# Patient Record
Sex: Female | Born: 1980 | Race: White | Hispanic: No | Smoking: Never smoker
Health system: Southern US, Community
[De-identification: ages and names within clinical notes are randomized; demographics above are authoritative.]

## PROBLEM LIST (undated history)

## (undated) DIAGNOSIS — F329 Major depressive disorder, single episode, unspecified: Secondary | ICD-10-CM

## (undated) DIAGNOSIS — F909 Attention-deficit hyperactivity disorder, unspecified type: Secondary | ICD-10-CM

## (undated) DIAGNOSIS — G43909 Migraine, unspecified, not intractable, without status migrainosus: Secondary | ICD-10-CM

## (undated) DIAGNOSIS — F419 Anxiety disorder, unspecified: Secondary | ICD-10-CM

## (undated) DIAGNOSIS — F32A Depression, unspecified: Secondary | ICD-10-CM

## (undated) DIAGNOSIS — Z8041 Family history of malignant neoplasm of ovary: Secondary | ICD-10-CM

## (undated) HISTORY — DX: Depression, unspecified: F32.A

## (undated) HISTORY — PX: ABDOMINAL SURGERY: SHX537

## (undated) HISTORY — PX: ESOPHAGECTOMY: SUR457

## (undated) HISTORY — DX: Migraine, unspecified, not intractable, without status migrainosus: G43.909

## (undated) HISTORY — DX: Family history of malignant neoplasm of ovary: Z80.41

## (undated) HISTORY — PX: CHOLECYSTECTOMY: SHX55

## (undated) HISTORY — DX: Major depressive disorder, single episode, unspecified: F32.9

## (undated) HISTORY — DX: Attention-deficit hyperactivity disorder, unspecified type: F90.9

## (undated) HISTORY — DX: Anxiety disorder, unspecified: F41.9

---

## 2006-08-16 ENCOUNTER — Emergency Department (HOSPITAL_COMMUNITY): Admission: EM | Admit: 2006-08-16 | Discharge: 2006-08-16 | Payer: Self-pay | Admitting: Family Medicine

## 2006-09-16 ENCOUNTER — Emergency Department (HOSPITAL_COMMUNITY): Admission: EM | Admit: 2006-09-16 | Discharge: 2006-09-16 | Payer: Self-pay | Admitting: Emergency Medicine

## 2006-09-21 ENCOUNTER — Inpatient Hospital Stay (HOSPITAL_COMMUNITY): Admission: AD | Admit: 2006-09-21 | Discharge: 2006-09-21 | Payer: Self-pay | Admitting: Obstetrics and Gynecology

## 2007-05-08 ENCOUNTER — Encounter (INDEPENDENT_AMBULATORY_CARE_PROVIDER_SITE_OTHER): Payer: Self-pay | Admitting: Obstetrics and Gynecology

## 2007-05-08 ENCOUNTER — Inpatient Hospital Stay (HOSPITAL_COMMUNITY): Admission: AD | Admit: 2007-05-08 | Discharge: 2007-05-10 | Payer: Self-pay | Admitting: Obstetrics and Gynecology

## 2007-08-04 ENCOUNTER — Emergency Department (HOSPITAL_COMMUNITY): Admission: EM | Admit: 2007-08-04 | Discharge: 2007-08-04 | Payer: Self-pay | Admitting: Emergency Medicine

## 2007-11-01 ENCOUNTER — Emergency Department (HOSPITAL_COMMUNITY): Admission: EM | Admit: 2007-11-01 | Discharge: 2007-11-01 | Payer: Self-pay | Admitting: Emergency Medicine

## 2008-04-13 ENCOUNTER — Emergency Department (HOSPITAL_COMMUNITY): Admission: EM | Admit: 2008-04-13 | Discharge: 2008-04-13 | Payer: Self-pay | Admitting: Family Medicine

## 2008-05-21 ENCOUNTER — Emergency Department (HOSPITAL_COMMUNITY): Admission: EM | Admit: 2008-05-21 | Discharge: 2008-05-21 | Payer: Self-pay | Admitting: Family Medicine

## 2008-11-04 ENCOUNTER — Emergency Department (HOSPITAL_COMMUNITY): Admission: EM | Admit: 2008-11-04 | Discharge: 2008-11-04 | Payer: Self-pay | Admitting: Family Medicine

## 2008-12-10 ENCOUNTER — Emergency Department (HOSPITAL_COMMUNITY): Admission: EM | Admit: 2008-12-10 | Discharge: 2008-12-10 | Payer: Self-pay | Admitting: Emergency Medicine

## 2009-01-07 ENCOUNTER — Emergency Department (HOSPITAL_COMMUNITY): Admission: EM | Admit: 2009-01-07 | Discharge: 2009-01-08 | Payer: Self-pay | Admitting: Emergency Medicine

## 2009-04-25 ENCOUNTER — Emergency Department (HOSPITAL_COMMUNITY): Admission: EM | Admit: 2009-04-25 | Discharge: 2009-04-25 | Payer: Self-pay | Admitting: Family Medicine

## 2009-06-22 ENCOUNTER — Inpatient Hospital Stay (HOSPITAL_COMMUNITY): Admission: EM | Admit: 2009-06-22 | Discharge: 2009-06-28 | Payer: Self-pay | Admitting: Emergency Medicine

## 2009-07-13 ENCOUNTER — Ambulatory Visit: Payer: Self-pay | Admitting: Internal Medicine

## 2009-07-13 DIAGNOSIS — K220 Achalasia of cardia: Secondary | ICD-10-CM

## 2009-07-13 DIAGNOSIS — E611 Iron deficiency: Secondary | ICD-10-CM | POA: Insufficient documentation

## 2009-07-13 DIAGNOSIS — F319 Bipolar disorder, unspecified: Secondary | ICD-10-CM

## 2009-07-13 DIAGNOSIS — B3731 Acute candidiasis of vulva and vagina: Secondary | ICD-10-CM | POA: Insufficient documentation

## 2009-07-13 DIAGNOSIS — D509 Iron deficiency anemia, unspecified: Secondary | ICD-10-CM

## 2009-07-13 DIAGNOSIS — B373 Candidiasis of vulva and vagina: Secondary | ICD-10-CM

## 2009-07-13 LAB — CONVERTED CEMR LAB
Bilirubin Urine: NEGATIVE
Glucose, Urine, Semiquant: NEGATIVE
Nitrite: POSITIVE
Protein, U semiquant: 100
Specific Gravity, Urine: >=1.03
Urobilinogen, UA: 1
WBC Urine, dipstick: NEGATIVE
pH: 5

## 2009-09-09 ENCOUNTER — Emergency Department (HOSPITAL_COMMUNITY): Admission: EM | Admit: 2009-09-09 | Discharge: 2009-09-09 | Payer: Self-pay | Admitting: Family Medicine

## 2009-09-21 ENCOUNTER — Telehealth (INDEPENDENT_AMBULATORY_CARE_PROVIDER_SITE_OTHER): Payer: Self-pay | Admitting: Internal Medicine

## 2009-09-21 ENCOUNTER — Telehealth (INDEPENDENT_AMBULATORY_CARE_PROVIDER_SITE_OTHER): Payer: Self-pay | Admitting: *Deleted

## 2009-09-27 ENCOUNTER — Ambulatory Visit: Payer: Self-pay | Admitting: Internal Medicine

## 2009-09-27 DIAGNOSIS — R5381 Other malaise: Secondary | ICD-10-CM | POA: Insufficient documentation

## 2009-09-27 DIAGNOSIS — R5383 Other fatigue: Secondary | ICD-10-CM

## 2009-09-27 DIAGNOSIS — J9801 Acute bronchospasm: Secondary | ICD-10-CM | POA: Insufficient documentation

## 2009-10-13 DIAGNOSIS — D72819 Decreased white blood cell count, unspecified: Secondary | ICD-10-CM | POA: Insufficient documentation

## 2009-10-13 LAB — CONVERTED CEMR LAB
ALT: 9 units/L (ref 0–35)
AST: 15 units/L (ref 0–37)
Albumin: 4.2 g/dL (ref 3.5–5.2)
Alkaline Phosphatase: 43 units/L (ref 39–117)
BUN: 10 mg/dL (ref 6–23)
Basophils Absolute: 0 10*3/uL (ref 0.0–0.1)
Basophils Relative: 1 % (ref 0–1)
CO2: 25 meq/L (ref 19–32)
Calcium: 9.3 mg/dL (ref 8.4–10.5)
Chloride: 105 meq/L (ref 96–112)
Creatinine, Ser: 0.57 mg/dL (ref 0.40–1.20)
Eosinophils Absolute: 0 10*3/uL (ref 0.0–0.7)
Eosinophils Relative: 1 % (ref 0–5)
Glucose, Bld: 85 mg/dL (ref 70–99)
HCT: 36.2 % (ref 36.0–46.0)
Hemoglobin: 11.4 g/dL — ABNORMAL LOW (ref 12.0–15.0)
Lymphocytes Relative: 33 % (ref 12–46)
Lymphs Abs: 1.3 10*3/uL (ref 0.7–4.0)
MCHC: 31.5 g/dL (ref 30.0–36.0)
MCV: 99.2 fL (ref 78.0–100.0)
Monocytes Absolute: 0.2 10*3/uL (ref 0.1–1.0)
Monocytes Relative: 5 % (ref 3–12)
Neutro Abs: 2.3 10*3/uL (ref 1.7–7.7)
Neutrophils Relative %: 61 % (ref 43–77)
Platelets: 348 10*3/uL (ref 150–400)
Potassium: 5.3 meq/L (ref 3.5–5.3)
RBC: 3.65 M/uL — ABNORMAL LOW (ref 3.87–5.11)
RDW: 13.5 % (ref 11.5–15.5)
Sodium: 141 meq/L (ref 135–145)
TSH: 0.875 microintl units/mL (ref 0.350–4.500)
Total Bilirubin: 0.3 mg/dL (ref 0.3–1.2)
Total Protein: 7 g/dL (ref 6.0–8.3)
WBC: 3.8 10*3/uL — ABNORMAL LOW (ref 4.0–10.5)

## 2009-12-25 ENCOUNTER — Telehealth (INDEPENDENT_AMBULATORY_CARE_PROVIDER_SITE_OTHER): Payer: Self-pay | Admitting: Internal Medicine

## 2009-12-28 ENCOUNTER — Ambulatory Visit: Payer: Self-pay | Admitting: Internal Medicine

## 2010-01-02 ENCOUNTER — Telehealth (INDEPENDENT_AMBULATORY_CARE_PROVIDER_SITE_OTHER): Payer: Self-pay | Admitting: Internal Medicine

## 2010-01-03 ENCOUNTER — Ambulatory Visit: Payer: Self-pay | Admitting: Internal Medicine

## 2010-01-03 LAB — CONVERTED CEMR LAB
Basophils Absolute: 0.1 10*3/uL (ref 0.0–0.1)
Basophils Relative: 1 % (ref 0–1)
Eosinophils Absolute: 0 10*3/uL (ref 0.0–0.7)
Eosinophils Relative: 1 % (ref 0–5)
HCT: 31.2 % — ABNORMAL LOW (ref 36.0–46.0)
Hemoglobin: 10.5 g/dL — ABNORMAL LOW (ref 12.0–15.0)
INR: 1.11 (ref <1.50)
Lymphocytes Relative: 36 % (ref 12–46)
Lymphs Abs: 1.8 10*3/uL (ref 0.7–4.0)
MCHC: 33.7 g/dL (ref 30.0–36.0)
MCV: 94 fL (ref 78.0–100.0)
Monocytes Absolute: 0.4 10*3/uL (ref 0.1–1.0)
Monocytes Relative: 7 % (ref 3–12)
Neutro Abs: 2.8 10*3/uL (ref 1.7–7.7)
Neutrophils Relative %: 55 % (ref 43–77)
Platelets: 271 10*3/uL (ref 150–400)
Prothrombin Time: 14.2 s (ref 11.6–15.2)
RBC: 3.32 M/uL — ABNORMAL LOW (ref 3.87–5.11)
RDW: 13.4 % (ref 11.5–15.5)
WBC: 5.1 10*3/uL (ref 4.0–10.5)
aPTT: 31 s

## 2010-01-22 ENCOUNTER — Telehealth (INDEPENDENT_AMBULATORY_CARE_PROVIDER_SITE_OTHER): Payer: Self-pay | Admitting: Internal Medicine

## 2010-01-22 ENCOUNTER — Ambulatory Visit: Payer: Self-pay | Admitting: Internal Medicine

## 2010-02-04 ENCOUNTER — Telehealth (INDEPENDENT_AMBULATORY_CARE_PROVIDER_SITE_OTHER): Payer: Self-pay | Admitting: Internal Medicine

## 2010-02-10 ENCOUNTER — Inpatient Hospital Stay (HOSPITAL_COMMUNITY): Admission: EM | Admit: 2010-02-10 | Discharge: 2010-02-13 | Payer: Self-pay | Admitting: Emergency Medicine

## 2010-02-14 ENCOUNTER — Telehealth (INDEPENDENT_AMBULATORY_CARE_PROVIDER_SITE_OTHER): Payer: Self-pay | Admitting: Internal Medicine

## 2010-04-04 ENCOUNTER — Ambulatory Visit: Payer: Self-pay | Admitting: Internal Medicine

## 2010-04-04 DIAGNOSIS — E538 Deficiency of other specified B group vitamins: Secondary | ICD-10-CM | POA: Insufficient documentation

## 2010-04-04 DIAGNOSIS — M25569 Pain in unspecified knee: Secondary | ICD-10-CM

## 2010-04-04 LAB — CONVERTED CEMR LAB
Basophils Absolute: 0 10*3/uL (ref 0.0–0.1)
Basophils Relative: 0 % (ref 0–1)
Eosinophils Absolute: 0 10*3/uL (ref 0.0–0.7)
Eosinophils Relative: 0 % (ref 0–5)
HCT: 35.7 % — ABNORMAL LOW (ref 36.0–46.0)
Hemoglobin: 11.7 g/dL — ABNORMAL LOW (ref 12.0–15.0)
Lymphocytes Relative: 11 % — ABNORMAL LOW (ref 12–46)
Lymphs Abs: 0.9 10*3/uL (ref 0.7–4.0)
MCHC: 32.8 g/dL (ref 30.0–36.0)
MCV: 100.3 fL — ABNORMAL HIGH (ref 78.0–100.0)
Monocytes Absolute: 0.4 10*3/uL (ref 0.1–1.0)
Monocytes Relative: 5 % (ref 3–12)
Neutro Abs: 6.9 10*3/uL (ref 1.7–7.7)
Neutrophils Relative %: 84 % — ABNORMAL HIGH (ref 43–77)
Platelets: 249 10*3/uL (ref 150–400)
RBC: 3.56 M/uL — ABNORMAL LOW (ref 3.87–5.11)
RDW: 13.8 % (ref 11.5–15.5)
Vitamin B-12: 501 pg/mL (ref 211–911)
WBC: 8.2 10*3/uL (ref 4.0–10.5)

## 2010-04-12 ENCOUNTER — Telehealth (INDEPENDENT_AMBULATORY_CARE_PROVIDER_SITE_OTHER): Payer: Self-pay | Admitting: Internal Medicine

## 2010-05-07 ENCOUNTER — Encounter (INDEPENDENT_AMBULATORY_CARE_PROVIDER_SITE_OTHER): Payer: Self-pay | Admitting: Internal Medicine

## 2010-05-23 ENCOUNTER — Encounter (INDEPENDENT_AMBULATORY_CARE_PROVIDER_SITE_OTHER): Payer: Self-pay | Admitting: Internal Medicine

## 2010-05-30 ENCOUNTER — Encounter (INDEPENDENT_AMBULATORY_CARE_PROVIDER_SITE_OTHER): Payer: Self-pay | Admitting: Internal Medicine

## 2010-06-18 ENCOUNTER — Inpatient Hospital Stay (HOSPITAL_COMMUNITY): Admission: EM | Admit: 2010-06-18 | Discharge: 2010-06-25 | Payer: Self-pay | Admitting: Emergency Medicine

## 2010-07-31 ENCOUNTER — Inpatient Hospital Stay (HOSPITAL_COMMUNITY)
Admission: RE | Admit: 2010-07-31 | Discharge: 2010-08-09 | Payer: Self-pay | Source: Home / Self Care | Attending: Surgery | Admitting: Surgery

## 2010-08-06 ENCOUNTER — Ambulatory Visit: Payer: Self-pay | Admitting: Internal Medicine

## 2010-08-08 ENCOUNTER — Telehealth (INDEPENDENT_AMBULATORY_CARE_PROVIDER_SITE_OTHER): Payer: Self-pay | Admitting: *Deleted

## 2010-08-13 ENCOUNTER — Inpatient Hospital Stay (HOSPITAL_COMMUNITY): Admission: EM | Admit: 2010-08-13 | Discharge: 2010-08-27 | Payer: Self-pay | Attending: Surgery | Admitting: Surgery

## 2010-08-13 ENCOUNTER — Encounter
Admission: RE | Admit: 2010-08-13 | Discharge: 2010-08-13 | Payer: Self-pay | Source: Home / Self Care | Attending: Surgery | Admitting: Surgery

## 2010-08-26 LAB — BASIC METABOLIC PANEL
BUN: 7 mg/dL (ref 6–23)
CO2: 29 mEq/L (ref 19–32)
Calcium: 9.1 mg/dL (ref 8.4–10.5)
Chloride: 105 mEq/L (ref 96–112)
Creatinine, Ser: 0.72 mg/dL (ref 0.4–1.2)
GFR calc Af Amer: >60 mL/min (ref >60)
GFR calc non Af Amer: >60 mL/min (ref >60)
Glucose, Bld: 80 mg/dL (ref 70–99)
Potassium: 4.5 mEq/L (ref 3.5–5.1)
Sodium: 143 mEq/L (ref 135–145)

## 2010-08-26 LAB — DIFFERENTIAL
Basophils Absolute: 0 10*3/uL (ref 0.0–0.1)
Basophils Absolute: 0 10*3/uL (ref 0.0–0.1)
Basophils Relative: 0 % (ref 0–1)
Basophils Relative: 0 % (ref 0–1)
Eosinophils Absolute: 0 10*3/uL (ref 0.0–0.7)
Eosinophils Absolute: 0 10*3/uL (ref 0.0–0.7)
Eosinophils Relative: 1 % (ref 0–5)
Eosinophils Relative: 0 % (ref 0–5)
Lymphocytes Relative: 16 % (ref 12–46)
Lymphocytes Relative: 9 % — ABNORMAL LOW (ref 12–46)
Lymphs Abs: 1.1 10*3/uL (ref 0.7–4.0)
Lymphs Abs: 0.7 10*3/uL (ref 0.7–4.0)
Monocytes Absolute: 0.3 10*3/uL (ref 0.1–1.0)
Monocytes Absolute: 0.3 10*3/uL (ref 0.1–1.0)
Monocytes Relative: 4 % (ref 3–12)
Monocytes Relative: 4 % (ref 3–12)
Neutro Abs: 5.6 10*3/uL (ref 1.7–7.7)
Neutro Abs: 6.3 10*3/uL (ref 1.7–7.7)
Neutrophils Relative %: 80 % — ABNORMAL HIGH (ref 43–77)
Neutrophils Relative %: 87 % — ABNORMAL HIGH (ref 43–77)

## 2010-08-26 LAB — CBC
HCT: 30.3 % — ABNORMAL LOW (ref 36.0–46.0)
HCT: 27.5 % — ABNORMAL LOW (ref 36.0–46.0)
Hemoglobin: 10.1 g/dL — ABNORMAL LOW (ref 12.0–15.0)
Hemoglobin: 9.3 g/dL — ABNORMAL LOW (ref 12.0–15.0)
MCH: 33.2 pg (ref 26.0–34.0)
MCH: 33.5 pg (ref 26.0–34.0)
MCHC: 33.3 g/dL (ref 30.0–36.0)
MCHC: 33.8 g/dL (ref 30.0–36.0)
MCV: 99.7 fL (ref 78.0–100.0)
MCV: 98.9 fL (ref 78.0–100.0)
Platelets: 202 10*3/uL (ref 150–400)
Platelets: 183 10*3/uL (ref 150–400)
RBC: 3.04 MIL/uL — ABNORMAL LOW (ref 3.87–5.11)
RBC: 2.78 MIL/uL — ABNORMAL LOW (ref 3.87–5.11)
RDW: 12.9 % (ref 11.5–15.5)
RDW: 12.5 % (ref 11.5–15.5)
WBC: 7 10*3/uL (ref 4.0–10.5)
WBC: 7.3 10*3/uL (ref 4.0–10.5)

## 2010-08-26 LAB — COMPREHENSIVE METABOLIC PANEL
ALT: 23 U/L (ref 0–35)
AST: 22 U/L (ref 0–37)
Albumin: 2.9 g/dL — ABNORMAL LOW (ref 3.5–5.2)
Alkaline Phosphatase: 68 U/L (ref 39–117)
BUN: 6 mg/dL (ref 6–23)
CO2: 26 mEq/L (ref 19–32)
Calcium: 8.6 mg/dL (ref 8.4–10.5)
Chloride: 105 mEq/L (ref 96–112)
Creatinine, Ser: 0.59 mg/dL (ref 0.4–1.2)
GFR calc Af Amer: >60 mL/min (ref >60)
GFR calc non Af Amer: >60 mL/min (ref >60)
Glucose, Bld: 121 mg/dL — ABNORMAL HIGH (ref 70–99)
Potassium: 3.6 mEq/L (ref 3.5–5.1)
Sodium: 137 mEq/L (ref 135–145)
Total Bilirubin: 0.8 mg/dL (ref 0.3–1.2)
Total Protein: 5.9 g/dL — ABNORMAL LOW (ref 6.0–8.3)

## 2010-08-26 LAB — VANCOMYCIN, TROUGH: Vancomycin Tr: 15.3 ug/mL (ref 10.0–20.0)

## 2010-08-28 ENCOUNTER — Encounter (INDEPENDENT_AMBULATORY_CARE_PROVIDER_SITE_OTHER): Payer: Self-pay | Admitting: Internal Medicine

## 2010-08-28 LAB — CREATININE, SERUM
Creatinine, Ser: 0.46 mg/dL (ref 0.4–1.2)
GFR calc Af Amer: >60 mL/min (ref >60)
GFR calc non Af Amer: >60 mL/min (ref >60)

## 2010-09-08 LAB — CONVERTED CEMR LAB
Ferritin: 18 ng/mL (ref 10–291)
Iron: 110 ug/dL (ref 42–145)
RBC Folate: 683 ng/mL — ABNORMAL HIGH (ref 180–600)
Saturation Ratios: 29 % (ref 20–55)
TIBC: 375 ug/dL (ref 250–470)
UIBC: 265 ug/dL
Vitamin B-12: 215 pg/mL (ref 211–911)

## 2010-09-11 ENCOUNTER — Encounter: Payer: Self-pay | Admitting: Surgery

## 2010-09-12 NOTE — Progress Notes (Signed)
  Phone Note Call from Patient Call back at [facility] Phone [phone]   Summary of Call: The pt wants the provider office call in the morning after pill so medicaid can pay; otherwise, will cost her 35 to 70 dollars.  ([facility]  [phone]) Delrae Alfred MD Initial call taken by: Manon Hilding,  February 04, 2010 2:51 PM  Follow-up for Phone Call        Left message on answering machine for pt to return call at [phone]. Will forward to Dr. Delrae Alfred to see if it can be authorized.  Follow-up by: Vesta Mixer CMA,  February 05, 2010 3:11 PM

## 2010-09-12 NOTE — Progress Notes (Signed)
Summary: Lab Results/ Medical Refills  Phone Note Call from Patient Call back at Home Phone [phone] Call back at [phone]   Summary of Call: Pt needs refills in her ritalin medication also she needs her lab results.  ([facility]) Delrae Alfred MD Initial call taken by: Manon Hilding,  Jan 02, 2010 9:49 AM  Follow-up for Phone Call        Fwd to Dr. Delrae Alfred for lab results and rx refill. Follow-up by: Vesta Mixer CMA,  Jan 02, 2010 4:07 PM  Additional Follow-up for Phone Call Additional follow up Details #1::        She has a bit more anemia than previously.  Her white count is now normal. Her clotting studies were normal--nothing to suggest she has a bleeding problem.  See append to lab--need her to come back for RBC folate and send off for anemia labs ordered. I have not been able to speak with Aquilla Solian regarding her case as Marchelle Folks was not here on Tuesday.  Ritalin is on hold for now. Additional Follow-up by: Julieanne Manson MD,  Jan 02, 2010 8:36 PM    Additional Follow-up for Phone Call Additional follow up Details #2::    pt aware Follow-up by: Armenia Shannon,  Jan 04, 2010 3:06 PM

## 2010-09-12 NOTE — Letter (Signed)
Summary: PT INFORMATION SHEET  PT INFORMATION SHEET   Imported By: Arta Bruce 09/05/2009 10:19:35  _____________________________________________________________________  External Attachment:    Type:   Image     Comment:   External Document

## 2010-09-12 NOTE — Progress Notes (Signed)
Summary: Needs GI Referral/ Refills  Phone Note Call from Patient Call back at [phone]   Summary of Call: Since "Sunday, the pt was stayed in the hospital and she was discharged yesterday from achalasia filled up and she is requesting for a referral to go to the GI physician.  (Dr. Ganem [phone])  MD Initial call taken by: Graciela Kellar,  February 14, 2010 10:17 AM  Follow-up for Phone Call        MS HOLLOWAY SAYS THAT SHE WILL BE NEEDING REFILLS ON HER LEXAPRO, AND LAMACTIL. [facility] [address]. Follow-up by: Kimberly Tinnin,  February 15, 2010 12:00 PM  Additional Follow-up for Phone Call Additional follow up Details #1::        pt has an appt on 02/15/2010 with  and i see that she cancelled. she will need to reschedule a hospital f/u appt with dr.  so she can review hospital records prior to referral. will refill meds for 30 days (as this is what Dr.  did last month).  she can get refills at her f/u Additional Follow-up by: Nykedtra Martin FNP,  February 18, 2010 2:38 PM    Additional Follow-up for Phone Call Additional follow up Details #2::    Advised of N. Martin's instructions and refills x1.  Confirmed appt. for 03/01/10 and advised that GI referral is in process.  Theresa Laib RN  February 18, 2010 2:48 PM  Follow-up by: Theresa Laib RN,  February 18, 2010 2:48 PM  Prescriptions: LAMICTAL 100 MG TABS (LAMOTRIGINE) 1 tab by mouth two times a day  #60 x 0   Entered and Authorized by:   Nykedtra Martin FNP   Signed by:   Nykedtra Martin FNP on 02/18/2010   Method used:   [facility]        [facility] [address]  [id number] ([facility])       [address]       [place], [place]  [address]       Ph: [phone] or [phone]       Fax: [phone]   RxID:   [id number] LEXAPRO 20 MG TABS (ESCITALOPRAM OXALATE) 1 tab by mouth daily  #30 x 0   Entered and Authorized by:   Nykedtra Martin FNP   Signed by:   Nykedtra Martin FNP on 02/18/2010  Method used:   [facility]ronically [facility]        [facility] [address]  [id number] ([facility]ail)       [address] [place], [place]  [address]       Ph: [phone] or [phone]       Fax: [phone]   RxID:   [id number]   Appended Document: Needs GI Referral/ Refills

## 2010-09-12 NOTE — Progress Notes (Signed)
  Phone Note Call from Patient Call back at [facility] Phone [phone]   Summary of Call: The pt needs some antibiotics for her upper respiratory infection.  ([facility]) Ryatt Corsino MD Initial call taken by: Manon Hilding,  September 21, 2009 10:11 AM  Follow-up for Phone Call        Pt states she has been sick for 7days. She tried going to Urgent Care and she states thet told her to see her PCP. She has a fever of 102. She has been taking Tylenol Cold and has been coughing up green mucus Pt [facility] [address],  .Marland KitchenGeanie Cooley   September 21, 2009 11:47 AM   Additional Follow-up for Phone Call Additional follow up Details #1::        Ill for almost 8 days now.  Coughing and congested from nose--green mucous.  Feels like she's wheezing--short of breaqth. Did go to Urgent Care at beginning of week --was told had a virus. Feels like she is worsening.  Having fevers even today with temps near 102  She is to go to Urgent Care over the weekend if worsens--really needs to have someone listen to lungs To call at beginning of week if no better and needs to be seen Zpack called in Nyquil at night, Dayquil in daytime Cool mist humidifier Push fluids  Additional Follow-up by: Julieanne Manson MD,  September 21, 2009 6:18 PM    New/Updated Medications: ZITHROMAX 250 MG TABS (AZITHROMYCIN) 2 tabs by mouth today and 1 tab daily for 4 more days Prescriptions: ZITHROMAX 250 MG TABS (AZITHROMYCIN) 2 tabs by mouth today and 1 tab daily for 4 more days  #6 x 0   Entered and Authorized by:   Julieanne Manson MD   Signed by:   Julieanne Manson MD on 09/21/2009   Method used:   [facility]ronically to        [facility]  [phone]* (retail)       [address] [place], [place]  [address]       Ph: [phone] or [phone]       Fax: [phone]   RxID:   [id number]

## 2010-09-12 NOTE — Assessment & Plan Note (Signed)
Summary: fu for wheezing/chest hurts////kt   Vital Signs:  Patient profile:   30 year old female Menstrual status:  regular LMP:     09/24/2009 Weight:      139.1 pounds Temp:     98.2 degrees F oral Pulse rate:   93 / minute Pulse rhythm:   regular Resp:     18 per minute BP sitting:   98 / 65  (left arm) Cuff size:   regular  Vitals Entered By: Geanie Cooley  (September 27, 2009 10:53 AM) CC: Pt states she has been doing alot of wheezing since last friday  09/21/2009. Pt states she has been having a little chest pain with the wheezing. Pt has abeen coughing as well since last friday. Is Patient Diabetic? No Pain Assessment Patient in pain? no       Does patient need assistance? Functional Status Self care Ambulation Normal LMP (date): 09/24/2009     Menstrual Status regular Enter LMP: 09/24/2009   CC:  Pt states she has been doing alot of wheezing since last friday  09/21/2009. Pt states she has been having a little chest pain with the wheezing. Pt has abeen coughing as well since last friday.Marland Kitchen  History of Present Illness: 1.  Wheezing and congested:  See phone note from 09/21/09:  pt. given a Zpack with 8 days of symptoms, but still fairly symptomatic, though does note some improvement.  Still with nasal congestion--now not always green since antibiotics.  No ear pain, no sore throat now.  Was having a fever up to 102 prior to antibiotics.  Highest temp since was 101 2 days after starting antibiotics.  Was 99.3 yesterday.  Cough is nonproductive.  Still feels a bit dyspneic.  Feels she has been ill since November.  Fatigued, week.  Daughter was recently ill for 9 days--diagnosed with Strep throat --2 weeks ago. Eating and drinking okay.  Habits & Providers  Alcohol-Tobacco-Diet     Tobacco Status: never  Allergies (verified): 1)  ! Penicillin  Social History: Smoking Status:  never  Physical Exam  General:  NAD Head:  Normocephalic and atraumatic without obvious  abnormalities. No apparent alopecia or balding. Eyes:  No corneal or conjunctival inflammation noted. EOMI. Perrla. Funduscopic exam benign, without hemorrhages, exudates or papilledema. Vision grossly normal. Ears:  External ear exam shows no significant lesions or deformities.  Otoscopic examination reveals clear canals, tympanic membranes are intact bilaterally without bulging, retraction, inflammation or discharge. Hearing is grossly normal bilaterally. Nose:  mucosal erythema and mucosal edema.   Mouth:  pharynx pink and moist.   Neck:  No deformities, masses, or tenderness noted. Lungs:  Scattered crackles and expiratory wheeze.  Fair air movement Heart:  Normal rate and regular rhythm. S1 and S2 normal without gallop, murmur, click, rub or other extra sounds.   Impression & Recommendations:  Problem # 1:  ACUTE BRONCHOSPASM (ICD-519.11) Wheezing much improved after albuterol neb Feel no longer with infection after Z pack Sample of Advair HFA given Albuterol HFA prn Orders: Nebulizer Tx ([address]) Albuterol Sulfate Sol 1mg  unit dose (U0454)  Problem # 2:  FATIGUE (ICD-780.79) Feels she has been this way since November Orders: T-Comprehensive Metabolic Panel [phone]) T-CBC w/Diff (29562-13086) T-TSH (57846-96295)  Problem # 3:  ACHALASIA (ICD-530.0) Refill meds.  Complete Medication List: 1)  Lexapro 10 Mg Tabs (Escitalopram oxalate) .Marland Kitchen.. 1 by mouth once daily 2)  Lamictal Odt 50 Mg Tbdp (Lamotrigine) .Marland Kitchen.. 1 by mouth once daily 3)  Sucralfate 1  Gm Tabs (Sucralfate) .Marland Kitchen.. 1gram three times a day with meal 4)  Nexium 40 Mg Pack (Esomeprazole magnesium) .Marland Kitchen.. 1 by mouth once daily 5)  Nifedipine 10 Mg Caps (Nifedipine) .Marland Kitchen.. 1 by mouth three times a day ac 6)  Ritalin La 40 Mg Xr24h-cap (Methylphenidate hcl) .Marland Kitchen.. 1 once daily by mouth 7)  Fluconazole 150 Mg Tabs (Fluconazole) .Marland Kitchen.. 1 tab by mouth daily for 2 days 8)  Zithromax 250 Mg Tabs (Azithromycin) .... 2 tabs by mouth  today and 1 tab daily for 4 more days 9)  Ventolin Hfa 108 (90 Base) Mcg/act Aers (Albuterol sulfate) .... 2 puffs every 4 hours as needed for bronchospasm/wheezing. 10)  Advair Hfa 115-21 Mcg/act Aero (Fluticasone-salmeterol) .... 2 inhalations two times a day  Patient Instructions: 1)  Follow up with Dr. Delrae Alfred in 1 week--wheezing Prescriptions: ADVAIR HFA 115-21 MCG/ACT AERO (FLUTICASONE-SALMETEROL) 2 inhalations two times a day  #1 x 0   Entered and Authorized by:   Julieanne Manson MD   Signed by:   Julieanne Manson MD on 09/27/2009   Method used:   Samples Given   RxID:   [id number] VENTOLIN HFA 108 (90 BASE) MCG/ACT AERS (ALBUTEROL SULFATE) 2 puffs every 4 hours as needed for bronchospasm/wheezing.  #1 x 0   Entered and Authorized by:   Julieanne Manson MD   Signed by:   Julieanne Manson MD on 09/27/2009   Method used:   [facility]ronically to        [facility]  [phone]* (retail)       [address] [place], [place]  [address]       Ph: [phone] or [phone]       Fax: [phone]   RxID:   [id number] NIFEDIPINE 10 MG CAPS (NIFEDIPINE) 1 by mouth three times a day ac  #90 x 6   Entered and Authorized by:   Julieanne Manson MD   Signed by:   Julieanne Manson MD on 09/27/2009   Method used:   Electronically to        [facility]  [phone]* (retail)       [address] [place], [place]  [address]       Ph: [phone] or [phone]       Fax: [phone]   RxID:   [id number] NEXIUM 40 MG PACK (ESOMEPRAZOLE MAGNESIUM) 1 by mouth once daily  #30 x 6   Entered and Authorized by:   Julieanne Manson MD   Signed by:   Julieanne Manson MD on 09/27/2009   Method used:   Electronically to        [facility]  [phone]* (retail)       [address] [place], [place]  [address]       Ph: [phone] or [phone]       Fax: [phone]   RxID:   [id number] NIFEDIPINE 10 MG CAPS (NIFEDIPINE) 1 by mouth  three times a day ac  #90 x 6   Entered and Authorized by:   Julieanne Manson MD   Signed by:   Julieanne Manson MD on 09/27/2009   Method used:   Faxed to ...       [facility] (retail)       [address] [place], [place]  [address]       Ph:  [phone]       Fax: [phone]   RxID:   [id number] NEXIUM 40 MG PACK (ESOMEPRAZOLE MAGNESIUM) 1 by mouth once daily  #30 x 6   Entered and Authorized by:   Julieanne Manson MD   Signed by:   Julieanne Manson MD on 09/27/2009   Method used:   Faxed to ...       [facility] (retail)       [address] [place], [place]  [address]       Ph: [phone]       Fax: [phone]   RxID:   [id number]    Medication Administration  Medication # 1:    Medication: Albuterol Sulfate Sol 1mg  unit dose    Diagnosis: ACUTE BRONCHOSPASM (ICD-519.11)    Dose: 2.5 mg    Route: po    Exp Date: 03/2011    Given by: Geanie Cooley (September 27, 2009 1:18 PM)  Orders Added: 1)  Nebulizer Tx [94640] 2)  T-Comprehensive Metabolic Panel [80053-22900] 3)  T-CBC w/Diff [84166-06301] 4)  T-TSH [60109-32355] 5)  Est. Patient Level III [[address]] 6)  Albuterol Sulfate Sol 1mg  unit dose [U5427]

## 2010-09-12 NOTE — Assessment & Plan Note (Signed)
Summary: medication changes//gk   Vital Signs:  Patient profile:   30 year old female Menstrual status:  regular Height:      63 inches Weight:      146 pounds BMI:     25.96 Temp:     97.6 degrees F oral Pulse rate:   71 / minute Pulse rhythm:   regular Resp:     18 per minute BP sitting:   96 / 65  (left arm) Cuff size:   regular  Vitals Entered By: Armenia Shannon (Dec 28, 2009 3:49 PM) CC: pt is here to do a med change...  lexapro, lamitcal and ritalin... Is Patient Diabetic? No Pain Assessment Patient in pain? no       Does patient need assistance? Functional Status Self care Ambulation Normal   CC:  pt is here to do a med change...  lexapro and lamitcal and ritalin....  History of Present Illness: 1.  See phone note.  Called and spoke with ?Verlon Au, pt's contact at [facility]:  apparent blood work did not show a level of Ritalin, so determined she was not taking and did not need.  Pt. refutes, states they must have mixed up her bloodwork with someone else's. Is planning to start school and needs med.  Refuses to go back to [facility] even to get other psychotropics.  Struggling without them and wants [place] to fell all 3 meds.  2.  Bilateral knee aching all the time--wakes her from sleep.  Has had problems since she was a little girl.  3.  Bruises easily--noted for at least past 8 months.  Mainly on legs and sometimes on arms.  No bleeding disorders she is aware of in family.  No recent nose bleeds.  Gums only rarely bleed with significant brushing.  Current Medications (verified): 1)  Lexapro 10 Mg Tabs (Escitalopram Oxalate) .Marland Kitchen.. 1 By Mouth Once Daily 2)  Lamictal Odt 50 Mg Tbdp (Lamotrigine) .Marland Kitchen.. 1 By Mouth Once Daily 3)  Sucralfate 1 Gm Tabs (Sucralfate) .Marland Kitchen.. 1gram Three Times A Day With Meal 4)  Nexium 40 Mg Pack (Esomeprazole Magnesium) .Marland Kitchen.. 1 By Mouth Once Daily 5)  Nifedipine 10 Mg Caps (Nifedipine) .Marland Kitchen.. 1 By Mouth Three Times A Day Ac 6)  Ritalin La 40 Mg Xr24h-Cap  (Methylphenidate Hcl) .Marland Kitchen.. 1 Once Daily By Mouth 7)  Fluconazole 150 Mg Tabs (Fluconazole) .Marland Kitchen.. 1 Tab By Mouth Daily For 2 Days 8)  Zithromax 250 Mg Tabs (Azithromycin) .... 2 Tabs By Mouth Today and 1 Tab Daily For 4 More Days 9)  Ventolin Hfa 108 (90 Base) Mcg/act Aers (Albuterol Sulfate) .... 2 Puffs Every 4 Hours As Needed For Bronchospasm/wheezing. 10)  Advair Hfa 115-21 Mcg/act Aero (Fluticasone-Salmeterol) .... 2 Inhalations Two Times A Day  Allergies (verified): 1)  ! Penicillin  Physical Exam  General:  NAD, though obviously upset with current situation Lungs:  Normal respiratory effort, chest expands symmetrically. Lungs are clear to auscultation, no crackles or wheezes. Heart:  Normal rate and regular rhythm. S1 and S2 normal without gallop, murmur, click, rub or other extra sounds. Extremities:  No effusion, tenderness, or erythema. No crepitation.  Full ROM without discomfort.  No laxity of ligaments. Skin:  One large bruise on right leg--Yellowish hue on margins suggestive of resolution.  No other bruising noted.   Impression & Recommendations:  Problem # 1:  BIPOLAR DISORDER UNSPECIFIED (ICD-296.80) Assessment Unchanged Pt. giving hx today of ADD as well--has given hx of taking Ritalin in past, but we  have not discussed diagnosis as she was already in treatment at [facility] with Dr. Manson Passey.   Discussed I would like to have our liason with [facility], Aquilla Solian look into matter further, but that I would fill her psychotropics save for Ritalin today as appears she was not herself using the medication (again pt. denies this is the case).  Pt. feels she has been poorly treated by [facility]. Pt. in meantime is looking for a different mental health provider.  Needs to find before this Rx is out.  Problem # 2:  EASY BRUISING (ICD-782.7) Doubt bleeding diathesis Orders: T-CBC w/Diff (29562-13086) T-Protime, Auto (57846-96295) T-PTT (28413-24401)  Complete Medication List: 1)   Lexapro 20 Mg Tabs (Escitalopram oxalate) .Marland Kitchen.. 1 tab by mouth daily 2)  Lamictal 100 Mg Tabs (Lamotrigine) .Marland Kitchen.. 1 tab by mouth two times a day 3)  Sucralfate 1 Gm Tabs (Sucralfate) .Marland Kitchen.. 1gram three times a day with meal 4)  Nexium 40 Mg Pack (Esomeprazole magnesium) .Marland Kitchen.. 1 by mouth once daily 5)  Nifedipine 10 Mg Caps (Nifedipine) .Marland Kitchen.. 1 by mouth three times a day ac 6)  Ritalin La 40 Mg Xr24h-cap (Methylphenidate hcl) .Marland Kitchen.. 1 once daily by mouth 7)  Fluconazole 150 Mg Tabs (Fluconazole) .Marland Kitchen.. 1 tab by mouth daily for 2 days 8)  Zithromax 250 Mg Tabs (Azithromycin) .... 2 tabs by mouth today and 1 tab daily for 4 more days 9)  Ventolin Hfa 108 (90 Base) Mcg/act Aers (Albuterol sulfate) .... 2 puffs every 4 hours as needed for bronchospasm/wheezing. 10)  Advair Hfa 115-21 Mcg/act Aero (Fluticasone-salmeterol) .... 2 inhalations two times a day  Patient Instructions: 1)  Call if you do not hear about the Ritalin by end of next week. Prescriptions: LEXAPRO 20 MG TABS (ESCITALOPRAM OXALATE) 1 tab by mouth daily  #30 x 0   Entered and Authorized by:   Julieanne Manson MD   Signed by:   Julieanne Manson MD on 12/28/2009   Method used:   [facility]ronically to        [facility]  [phone]* (retail)       [address] [place], [place]  [address]       Ph: [phone] or [phone]       Fax: [phone]   RxID:   [id number] LAMICTAL 100 MG TABS (LAMOTRIGINE) 1 tab by mouth two times a day  #60 x 0   Entered and Authorized by:   Julieanne Manson MD   Signed by:   Julieanne Manson MD on 12/28/2009   Method used:   Electronically to        [facility]  [phone]* (retail)       [address] [place], [place]  [address]       Ph: [phone] or [phone]       Fax: [phone]   RxID:   [id number]

## 2010-09-12 NOTE — Progress Notes (Signed)
Summary: Office Visit/DEPRESSION SCREENING  Office Visit/DEPRESSION SCREENING   Imported By: Arta Bruce 09/04/2009 14:20:33  _____________________________________________________________________  External Attachment:    Type:   Image     Comment:   External Document

## 2010-09-12 NOTE — Progress Notes (Signed)
Summary: YEAST INFEC./WANTS DIFLUCAN  Phone Note Call from Patient Call back at [facility] Phone [phone]   Summary of Call: [name] CALLED AND SAYS THATSHE HAS A YEAST INFECTION AND WOULD LIKE FOR DR Tristy Udovich TO IN DIFLUCAN TO [facility] [address], AND FOR SOMEONE TO CALL AND LET HER KNOW ITS BEEN DONE. Initial call taken by: Leodis Rains,  April 12, 2010 12:35 PM  Follow-up for Phone Call        fwd to Dr.Ocie Stanzione Follow-up by: Michelle Nasuti,  April 12, 2010 3:04 PM  Additional Follow-up for Phone Call Additional follow up Details #1::        MS HOLLOWAY CALLED FOR THE SECOND TIME. Additional Follow-up by: Leodis Rains,  April 12, 2010 3:30 PM    Additional Follow-up for Phone Call Additional follow up Details #2::    Called pt.--reached her "confidentional voice mail"  and name given--left message to try an over the counter vaginal treatment and to call on Tuesday if no improvement.  Asked her to give symptoms in future. Follow-up by: Julieanne Manson MD,  April 12, 2010 6:03 PM

## 2010-09-12 NOTE — Letter (Signed)
Summary: PSYCHOLOGY REFERRAL/NO SHOW  PSYCHOLOGY REFERRAL/NO SHOW   Imported By: Arta Bruce 08/19/2010 10:08:41  _____________________________________________________________________  External Attachment:    Type:   Image     Comment:   External Document

## 2010-09-12 NOTE — Progress Notes (Signed)
Summary: Discharge  Phone Note Other Incoming   Summary of Call: Please review patient's chart for possible D/C. This patient has reached her 5th no-show. Thanks [facility] Initial call taken by: Hassell Halim CMA,  August 08, 2010 9:42 AM  Follow-up for Phone Call        May discharge Follow-up by: Julieanne Manson MD,  August 08, 2010 5:59 PM  Additional Follow-up for Phone Call Additional follow up Details #1::        Please procede with discharge.Marland KitchenMarland KitchenMarland KitchenHassell Halim CMA  August 27, 2010 3:26 PM     Additional Follow-up for Phone Call Additional follow up Details #2::    COMPLETE/MAILED LETTER SCANNED INTO EMR AND DISCHARGED IN [facility] Follow-up by: Arta Bruce,  August 28, 2010 3:29 PM

## 2010-09-12 NOTE — Letter (Signed)
Summary: FAXED REQUESTED INFO TO [facility]  FAXED REQUESTED INFO TO [facility]   Imported By: Arta Bruce 05/23/2010 12:06:23  _____________________________________________________________________  External Attachment:    Type:   Image     Comment:   External Document

## 2010-09-12 NOTE — Progress Notes (Signed)
Summary: Pt wants to speak with the provider re meds and appt  Phone Note Call from Patient Call back at [facility] Phone [phone] Call back at [phone]   Summary of Call: The pt used to go the [facility] but she states that her psychiatric mess up her blood work and because of that they took away her medication (Ritalin 40 mg) and she is wondered if the provider can give that lamitcal 200 mg and lexapro 20 mg.   Basically wants to share with the provider directly because she states that they treat her unfairly. ([facility])   Pt had been trying to get an appointment with the provider asap. Mattox Schorr MD Initial call taken by: Manon Hilding,  Dec 25, 2009 9:26 AM  Follow-up for Phone Call        Left message on answering machine to call [facility]. Dutch Quint RN  Dec 26, 2009 11:45 AM  Spoke with pt. -- needs Lamictal, Ritalin and Lexapro filled asap.  Appt. made for 01/08/10.  Pt. wanted to come in and wait - advised there's no guarantee of being seen.  Dutch Quint RN  Dec 26, 2009 11:58 AM   Additional Follow-up for Phone Call Additional follow up Details #1::        Was seen 12/28/09--did not refill Ritalin as with blood testing at [facility], pt. did not show a Ritalin level.  Discussed this with pt. at visit--see OV Additional Follow-up by: Julieanne Manson MD,  Dec 31, 2009 8:27 AM

## 2010-09-12 NOTE — Progress Notes (Signed)
Summary: Medication refill  Phone Note Refill Request Message from:  Patient  Refills Requested: Medication #1:  LEXAPRO 20 MG TABS 1 tab by mouth daily   Last Refilled: 12/28/2009  Medication #2:  LAMICTAL 100 MG TABS 1 tab by mouth two times a day   Last Refilled: 12/28/2009 Initial call taken by: Levon Hedger,  January 22, 2010 2:30 PM  Follow-up for Phone Call        [name] was going to find a new psychiatric provider before her Rx was up.   Will fill one more month, but want her to find another provider or return to [facility] for psych Rx after this Follow-up by: Julieanne Manson MD,  January 22, 2010 6:22 PM    Prescriptions: LAMICTAL 100 MG TABS (LAMOTRIGINE) 1 tab by mouth two times a day  #60 x 0   Entered and Authorized by:   Julieanne Manson MD   Signed by:   Julieanne Manson MD on 01/22/2010   Method used:   Electronically to        [facility]  [phone]* (retail)       [address] [place], [place]  [address]       Ph: [phone] or [phone]       Fax: [phone]   RxID:   [id number] LEXAPRO 20 MG TABS (ESCITALOPRAM OXALATE) 1 tab by mouth daily  #30 x 0   Entered and Authorized by:   Julieanne Manson MD   Signed by:   Julieanne Manson MD on 01/22/2010   Method used:   Electronically to        [facility]  [phone]* (retail)       [address] [place], [place]  [address]       Ph: [phone] or [phone]       Fax: [phone]   RxID:   [id number]

## 2010-09-12 NOTE — Letter (Signed)
Summary: REPORT OF MEDICAL EXAM  REPORT OF MEDICAL EXAM   Imported By: Arta Bruce 09/06/2009 12:33:22  _____________________________________________________________________  External Attachment:    Type:   Image     Comment:   External Document

## 2010-09-12 NOTE — Progress Notes (Signed)
  Phone Note Call from Patient Call back at [facility] Phone [phone]

## 2010-09-12 NOTE — Letter (Signed)
Summary: Letter//discharge letter/no shows 08/06/10/03/01/10/01/16/10/3/18/1  Letter//discharge letter/no shows 08/06/10/03/01/10/01/16/10/10/26/09/08/23/09   Imported By: Arta Bruce 08/28/2010 15:20:04  _____________________________________________________________________  External Attachment:    Type:   Image     Comment:   External Document

## 2010-09-12 NOTE — Assessment & Plan Note (Signed)
Summary: renew medications//gk   Vital Signs:  Patient profile:   30 year old female Menstrual status:  regular LMP:     03/04/2010 Height:      63 inches O2 Sat:      99 % on Room air Temp:     98.2 degrees F oral Pulse rate:   79 / minute Pulse rhythm:   regular Resp:     16 per minute BP sitting:   104 / 68  (left arm)  Vitals Entered By: Vesta Mixer CMA (April 04, 2010 8:46 AM)  O2 Flow:  Room air CC: pt presents to clinic for follow up and med refills Is Patient Diabetic? No Pain Assessment Patient in pain? no       Does patient need assistance? Functional Status Self care Ambulation Normal LMP (date): 03/04/2010     Enter LMP: 03/04/2010   CC:  pt presents to clinic for follow up and med refills.  History of Present Illness: 1.  Bipolar Disorder:  Has been unable to establish with someone for psychiatric care.  Doing well on Lamictal and Lexapro.  Using Melatonin on rare occasion for sleep.    2.  ADD:  dx as per pt.  States in past was given Adderall through [facility], which has been questioned.  Does not want to go back there for this as well.   3.  Anemia:  Changed to pills from liquid of iron--the liquid burned her throat too much.  Also taking B12 as she had low normal B12 level.  Feels her energy level is much higher.  Has had esophageal ulcers in past.  No melena or hematochezia.  Periods are regular and quite heavy with clots.  Has been taking B12 and iron now for about 2 months, maybe 3.    4.  Medical coverage:  pt. started work end of July and will be out of Medicaid in October.  Has a 90 day waiting period before insurance with job kicks in.  That should also be in October --pt. concerned she will be without coverage for meds.  5.  Bilateral knee pain--points to lower patellar area.  Hurts more after sitting for a while.  No other joints involved.  No redness or swelling of joints--just an ache.  No history of injury.  6.  Acne lesions--has  tried a lot of otc meds.  Nothing seems to work. Not sure if has used Benzoyl peroxide.  Allergies: 1)  ! Penicillin  Physical Exam  General:  Few old acneiform lesions on face--generally on chin and around hairline Lungs:  Normal respiratory effort, chest expands symmetrically. Lungs are clear to auscultation, no crackles or wheezes. Heart:  Normal rate and regular rhythm. S1 and S2 normal without gallop, murmur, click, rub or other extra sounds. Abdomen:  Mild epigastric tendernesssoft, no hepatomegaly, and no splenomegaly.   Msk:  Full ROM of knees No swelling or redness, no crepitation. No ligamentous laxity  NT along joint line bilaterally Point tenderness that reproduces pain over patellar tendon bilaterally   Impression & Recommendations:  Problem # 1:  KNEE PAIN, BILATERAL (ICD-719.46) Appears to be a mild patellar tendinitis No NSAIDS with achalasia recommended quadriceps stretches and strengthening Encouraged swimming.  Problem # 2:  VITAMIN B12 DEFICIENCY (ICD-266.2)  Orders: T-CBC w/Diff ([address]) T-Vitamin B12 (09811-91478)  Problem # 3:  ANEMIA, IRON DEFICIENCY (ICD-280.9)  Her updated medication list for this problem includes:    Ferrous Sulfate 325 (65 Fe) Mg Tabs (  Ferrous sulfate) .Marland Kitchen... 1 tab by mouth two times a day    Vitamin B-12 1000 Mcg Tabs (Cyanocobalamin) .Marland Kitchen... 1 tab by mouth daily  Orders: T-CBC w/Diff ([address]-60454) T-Vitamin B12 (09811-91478)  Problem # 4:  ACHALASIA (ICD-530.0) Change Nexium to two times a day dosing as per Dr. Donavan Burnet. to call his office and see if can get samples if no coverage for a time.  Problem # 5:  BIPOLAR DISORDER UNSPECIFIED (ICD-296.80)  Controlled To check dates and see if will be without medication coverage in October--if so, can check with [employer] [facility] to see if can get HS pharmacy coverage for that time.  Orders: Psychology Referral (Psychology)  Complete Medication List: 1)  Lexapro 20 Mg Tabs  (Escitalopram oxalate) .Marland Kitchen.. 1 tab by mouth daily 2)  Lamictal 100 Mg Tabs (Lamotrigine) .Marland Kitchen.. 1 tab by mouth two times a day 3)  Sucralfate 1 Gm Tabs (Sucralfate) .Marland Kitchen.. 1gram three times a day with meal 4)  Nexium 40 Mg Pack (Esomeprazole magnesium) .Marland Kitchen.. 1 tab by mouth two times a day 5)  Nifedipine 10 Mg Caps (Nifedipine) .Marland Kitchen.. 1 by mouth three times a day ac 6)  Ritalin La 40 Mg Xr24h-cap (Methylphenidate hcl) .Marland Kitchen.. 1 once daily by mouth 7)  Ferrous Sulfate 325 (65 Fe) Mg Tabs (Ferrous sulfate) .Marland Kitchen.. 1 tab by mouth two times a day 8)  Vitamin B-12 1000 Mcg Tabs (Cyanocobalamin) .Marland Kitchen.. 1 tab by mouth daily 9)  Gi Cocktail  .... To use by mouth as needed dysphagia-dr. ganem  Patient Instructions: 1)  Follow up with Dr. Delrae Alfred in 4 months  2)  Referral to [facility] to find psych coverage for meds/counseling.   3)  Quadriceps stretches two times a day for 10-20 minutes. 4)  Encourage swimming or other quadriceps strengthening as will Prescriptions: VITAMIN B-12 1000 MCG TABS (CYANOCOBALAMIN) 1 tab by mouth daily  #90 x 1   Entered and Authorized by:   Julieanne Manson MD   Signed by:   Julieanne Manson MD on 04/04/2010   Method used:   [facility] to        [facility]  [phone]* (retail)       [address] [place], [place]  [address]       Ph: [phone] or [phone]       Fax: [phone]   RxID:   [id number] NIFEDIPINE 10 MG CAPS (NIFEDIPINE) 1 by mouth three times a day ac  #270 x 1   Entered and Authorized by:   Julieanne Manson MD   Signed by:   Julieanne Manson MD on 04/04/2010   Method used:   Electronically to        [facility]  [phone]* (retail)       [address] [place], [place]  [address]       Ph: [phone] or [phone]       Fax: [phone]   RxID:   [id number] NEXIUM 40 MG PACK (ESOMEPRAZOLE MAGNESIUM) 1 tab by mouth two times a day  #180 x 1   Entered and Authorized by:   Julieanne Manson MD   Signed by:    Julieanne Manson MD on 04/04/2010   Method used:   Electronically to        [facility]  [phone]* (retail)       [address] [place], [place]  [address]  Ph: [phone] or [phone]       Fax: [phone]   RxID:   [id number] LAMICTAL 100 MG TABS (LAMOTRIGINE) 1 tab by mouth two times a day  #180 x 1   Entered and Authorized by:   Julieanne Manson MD   Signed by:   Julieanne Manson MD on 04/04/2010   Method used:   Electronically to        [facility]  [phone]* (retail)       [address] [place], [place]  [address]       Ph: [phone] or [phone]       Fax: [phone]   RxID:   [id number] LEXAPRO 20 MG TABS (ESCITALOPRAM OXALATE) 1 tab by mouth daily  #90 x 1   Entered and Authorized by:   Julieanne Manson MD   Signed by:   Julieanne Manson MD on 04/04/2010   Method used:   Electronically to        [facility]  [phone]* (retail)       [address] [place], [place]  [address]       Ph: [phone] or [phone]       Fax: [phone]   RxID:   [id number]

## 2010-09-12 NOTE — Letter (Signed)
Summary: *HSN Results Follow up  [facility]  [address] [place], [place] [address]   Phone: [phone]  Fax: [phone]      05/07/2010   [facility] [address] [place], [place]  [address]   Dear  Ms. [name] [name],                            ____S.Drinkard,FNP   ____D. Gore,FNP       ____B. McPherson,MD   ____V. Rankins,MD    __X__E. Mulberry,MD    ____N. Daphine Deutscher, FNP  ____D. Reche Dixon, MD    ____K. Philipp Deputy, MD    ____Other     This letter is to inform you that your recent test(s):  _______Pap Smear    ___X____Lab Test     _______X-ray    _______ is within acceptable limits  _______ requires a medication change  _______ requires a follow-up lab visit  _______ requires a follow-up visit with your Nalaya Wojdyla   Comments:  Your anemia is almost resolved and your B12 level is now in a normal range.  I would keep taking both the iron (as you have ongoing losses with your periods) and the B12 and we can recheck your blood counts in the next 4-6 months.       _________________________________________________________ If you have any questions, please contact our office                     Sincerely,  [name] Manson MD [facility]

## 2010-09-24 DIAGNOSIS — K222 Esophageal obstruction: Secondary | ICD-10-CM | POA: Insufficient documentation

## 2010-09-24 NOTE — Op Note (Signed)
[name], [name]             ACCOUNT NO.:  [id number]  MEDICAL RECORD NO.:  [id number]          PATIENT TYPE:  INP  LOCATION:  1343                         FACILITY:  [facility]  PHYSICIAN:  Graylin Shiver, M.D.   DATE OF BIRTH:  1980-11-12  DATE OF PROCEDURE:  08/14/2010 DATE OF DISCHARGE:                              OPERATIVE REPORT   INDICATIONS FOR PROCEDURE:  The patient is a 30 year old female who recently underwent a Heller myotomy for chronic achalasia a couple of weeks ago.  She was subsequently discharged after the surgery.  She was admitted yesterday because of complaints of vomiting and being unable to keep anything down.  A barium swallow was done which showed barium retained in the esophagus without passage into the stomach.  Dr. Daphine Deutscher requested endoscopy to evaluate the distal esophagus and see if there was an obstruction or anything else going on.  Informed consent was obtained after explanation of the risks of bleeding, infection and perforation.  Sedation was done by anesthesia using propofol.  PROCEDURE IN DETAIL:  With the patient in the left lateral decubitus position, the Pentax gastroscope was inserted into the oropharynx and passed into the esophagus.  It was advanced down the esophagus which looked to be somewhat dilated.  There was a little bit of retained barium at the distal esophagus which was suctioned.  After this was suctioned, I could see the area of the esophagogastric junction which was somewhat angulated up to the right side of the visual field.  I angled the scope tip towards the esophagogastric junction and felt that there was some spasm at this point.  After keeping the scope tip at the esophagogastric junction for a few seconds and gently manipulating the tip of the scope, the scope did go into the stomach without any difficulty.  There was some erythema noted at the distal esophagus region at the level of the esophagogastric junction,  but no ulcers or any other significant abnormalities.  After entering the stomach, the scope was retroflexed and I looked back up at the fundus and cardia of the stomach which looked fine without any obvious lesions.  The scope was then straightened and brought back up into the esophagus.  At this point, after going into the stomach, the esophagogastric junction and distal esophagus area was open and there does not appear to be any evidence of an actual obstruction at this time.  I moved the scope back and forth through the esophagogastric junction a few times and it seemed to be nicely open and without obvious spasm as initially detected.  The rest of the esophagus mucosa looked fine.  She tolerated this procedure well without complications.  IMPRESSION: 1. Achalasia. 2. Status post Heller myotomy. 3. Spasm at the esophagogastric junction, which was relieved and then     finally the scope was able to get into the stomach.  There does not     appear to be an obvious stricture at this point at this time.  PLAN:  Observation.  Try clear liquids.  I talked to the patient and her family in the recovery area of endoscopy after  the procedure.  She felt fine.  She was sitting up and we reviewed the findings.  Hopefully, she will do well.          ______________________________ Graylin Shiver, M.D.     SFG/MEDQ  D:  08/14/2010  T:  08/14/2010  Job:  [id number]  cc:   Thornton Park Daphine Deutscher, MD [address], [address] [place] [address]  Electronically Signed by Herbert Moors MD on 09/24/2010 12:48:02 PM

## 2010-09-30 ENCOUNTER — Emergency Department (HOSPITAL_COMMUNITY)
Admission: EM | Admit: 2010-09-30 | Discharge: 2010-09-30 | Disposition: A | Discharge status: Home / Self Care | Payer: Medicaid Other | Attending: Emergency Medicine | Admitting: Emergency Medicine

## 2010-09-30 DIAGNOSIS — R112 Nausea with vomiting, unspecified: Secondary | ICD-10-CM | POA: Insufficient documentation

## 2010-09-30 DIAGNOSIS — F329 Major depressive disorder, single episode, unspecified: Secondary | ICD-10-CM | POA: Insufficient documentation

## 2010-09-30 DIAGNOSIS — R109 Unspecified abdominal pain: Secondary | ICD-10-CM | POA: Insufficient documentation

## 2010-09-30 DIAGNOSIS — F3289 Other specified depressive episodes: Secondary | ICD-10-CM | POA: Insufficient documentation

## 2010-09-30 DIAGNOSIS — K220 Achalasia of cardia: Secondary | ICD-10-CM | POA: Insufficient documentation

## 2010-09-30 DIAGNOSIS — F988 Other specified behavioral and emotional disorders with onset usually occurring in childhood and adolescence: Secondary | ICD-10-CM | POA: Insufficient documentation

## 2010-09-30 LAB — COMPREHENSIVE METABOLIC PANEL
ALT: 23 U/L (ref 0–35)
AST: 32 U/L (ref 0–37)
Alkaline Phosphatase: 63 U/L (ref 39–117)
CO2: 28 mEq/L (ref 19–32)
Chloride: 101 mEq/L (ref 96–112)
GFR calc Af Amer: >60 mL/min (ref >60)
GFR calc non Af Amer: >60 mL/min (ref >60)
Potassium: 4.9 mEq/L (ref 3.5–5.1)
Sodium: 136 mEq/L (ref 135–145)
Total Bilirubin: 0.2 mg/dL — ABNORMAL LOW (ref 0.3–1.2)

## 2010-09-30 LAB — CBC
Hemoglobin: 11.1 g/dL — ABNORMAL LOW (ref 12.0–15.0)
MCH: 32.4 pg (ref 26.0–34.0)
RBC: 3.43 MIL/uL — ABNORMAL LOW (ref 3.87–5.11)
WBC: 4.9 10*3/uL (ref 4.0–10.5)

## 2010-09-30 LAB — URINALYSIS, ROUTINE W REFLEX MICROSCOPIC
Hgb urine dipstick: NEGATIVE
Specific Gravity, Urine: 1.021 (ref 1.005–1.030)
Urobilinogen, UA: 1 mg/dL (ref 0.0–1.0)

## 2010-09-30 LAB — DIFFERENTIAL
Basophils Absolute: 0 10*3/uL (ref 0.0–0.1)
Basophils Relative: 0 % (ref 0–1)
Monocytes Relative: 7 % (ref 3–12)
Neutro Abs: 3.4 10*3/uL (ref 1.7–7.7)
Neutrophils Relative %: 69 % (ref 43–77)

## 2010-10-02 ENCOUNTER — Inpatient Hospital Stay (HOSPITAL_COMMUNITY)
Admission: AD | Admit: 2010-10-02 | Discharge: 2010-10-08 | DRG: 392 | Disposition: A | Discharge status: Home / Self Care | Payer: Medicaid Other | Source: Ambulatory Visit | Attending: Surgery | Admitting: Surgery

## 2010-10-02 ENCOUNTER — Inpatient Hospital Stay (HOSPITAL_COMMUNITY): Payer: Medicaid Other

## 2010-10-02 DIAGNOSIS — F319 Bipolar disorder, unspecified: Secondary | ICD-10-CM | POA: Diagnosis present

## 2010-10-02 DIAGNOSIS — K228 Other specified diseases of esophagus: Principal | ICD-10-CM | POA: Diagnosis present

## 2010-10-02 DIAGNOSIS — K219 Gastro-esophageal reflux disease without esophagitis: Secondary | ICD-10-CM | POA: Diagnosis present

## 2010-10-02 DIAGNOSIS — Z9889 Other specified postprocedural states: Secondary | ICD-10-CM

## 2010-10-02 DIAGNOSIS — K2289 Other specified disease of esophagus: Principal | ICD-10-CM | POA: Diagnosis present

## 2010-10-02 DIAGNOSIS — K5900 Constipation, unspecified: Secondary | ICD-10-CM | POA: Diagnosis present

## 2010-10-02 DIAGNOSIS — R109 Unspecified abdominal pain: Secondary | ICD-10-CM | POA: Diagnosis present

## 2010-10-02 LAB — COMPREHENSIVE METABOLIC PANEL
AST: 37 U/L (ref 0–37)
BUN: 8 mg/dL (ref 6–23)
CO2: 26 mEq/L (ref 19–32)
Calcium: 9.1 mg/dL (ref 8.4–10.5)
Creatinine, Ser: 0.57 mg/dL (ref 0.4–1.2)
GFR calc Af Amer: >60 mL/min (ref >60)
GFR calc non Af Amer: >60 mL/min (ref >60)
Total Bilirubin: 1 mg/dL (ref 0.3–1.2)

## 2010-10-02 LAB — CBC
MCH: 32.3 pg (ref 26.0–34.0)
MCHC: 32.8 g/dL (ref 30.0–36.0)
MCV: 98.4 fL (ref 78.0–100.0)
Platelets: 193 10*3/uL (ref 150–400)

## 2010-10-02 LAB — DIFFERENTIAL
Basophils Absolute: 0 10*3/uL (ref 0.0–0.1)
Basophils Relative: 1 % (ref 0–1)
Eosinophils Absolute: 0 10*3/uL (ref 0.0–0.7)
Lymphs Abs: 0.8 10*3/uL (ref 0.7–4.0)
Neutro Abs: 2.3 10*3/uL (ref 1.7–7.7)

## 2010-10-02 MED ORDER — IOHEXOL 300 MG/ML  SOLN
100.0000 mL | Freq: Once | INTRAMUSCULAR | Status: AC | PRN
  Administered 2010-10-02: 100 mL via INTRAVENOUS

## 2010-10-02 NOTE — H&P (Signed)
  NAMEMORA, [name] [name]             ACCOUNT NO.:  [id number]  MEDICAL RECORD NO.:  [id number]           PATIENT TYPE:  I  LOCATION:  1598                         FACILITY:  [facility]  PHYSICIAN:  Thornton Park. Daphine Deutscher, MD  DATE OF BIRTH:  [birth year]  DATE OF ADMISSION:  10/02/2010 DATE OF DISCHARGE:                             HISTORY & PHYSICAL   CHIEF COMPLAINT:  Upper abdominal pain, status post esophageal dilatation and Botox injection at [facility].  HISTORY:  [name] [name] is a 30 year old white female who underwent a laparoscopic Heller myotomy done at [facility] on July 31, 2010. 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  Despite what appeared to be a good myotomy, she is persistently having some esophageal spasm and really no motility.  She underwent an endoscopy by Dr. Evette Cristal postop and it appeared the scope easily went into her stomach but I think it seems to be more of a motility issue.  She was referred to [facility] for evaluation.  Recently, she was admitted over there and was treated by undergoing barium swallow and with esophageal dilatation and Botox injection.  She said however they did not work up her upper abdominal pain.  I was concerned when she called a couple of days ago that may be she had a perforation and I asked her to go back to [facility] but she declined because she said they did not listen to her.  She is admitted at this time for hydration and further evaluation.  PAST MEDICAL HISTORY:  Significant for: 1. Bipolar disease. 2. Depression. 3. Achalasia.  FAMILY HISTORY:  Mother has congestive heart failure.  Her father has hypertension.  SOCIAL HISTORY:  The patient has 2 children.  She does not smoke or drink.  MEDICATIONS:  Include panoply of foregut medications some of which include: 1. Sucralfate. 2. Nexium. 3. Nifedipine for spasm. 4. She also takes lamotrigine 300 mg daily. 5.  Lexapro 20 mg daily.  ALLERGIES: 1. PENICILLIN. 2. ERYTHROMYCIN. 3. REGLAN.  PHYSICAL EXAMINATION:  GENERAL:  Normally developed white female who appears somewhat dry with drier mucous membranes. VITAL SIGNS:  She tends to sometimes run a low blood pressure in the 80/60 range. HEENT:  Unremarkable. NECK:  Supple. CHEST:  Clear to auscultation. ABDOMEN:  She complained of pain across the abdomen.  No rebound or guarding. EXTREMITIES:  Full range of motion.  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  We will go ahead and admit, get a CT scan to rule out any kind of perforation or complication of the aforementioned intervention by the gastroenterologist at [facility].  I do not as yet have any kind of followup letters or comments from them regarding discharge summary, etc.     Molli Hazard B. Daphine Deutscher, MD     MBM/MEDQ  D:  10/02/2010  T:  10/02/2010  Job:  [id number]  cc:   Graylin Shiver, M.D. Fax: [phone]  Electronically Signed by Luretha Murphy MD on 10/02/2010 10:17:43 PM

## 2010-10-14 NOTE — Discharge Summary (Signed)
  NAMECHRISTYL, OSENTOSKI             ACCOUNT NO.:  [id number]  MEDICAL RECORD NO.:  [id number]           PATIENT TYPE:  I  LOCATION:  1525                         FACILITY:  [facility]  PHYSICIAN:  Thornton Park. Daphine Deutscher, MD  DATE OF BIRTH:  1980/09/25  DATE OF ADMISSION:  10/02/2010 DATE OF DISCHARGE:  10/08/2010                              DISCHARGE SUMMARY   CHIEF COMPLAINT:  Upper abdominal pain status post esophageal dilatation and Botox injection at [facility].  HISTORY:  [name] [name] is a 30 year old white female who underwent laparoscopic Heller myotomy at [facility] on August 01, 2011.  She came to that operation with a 12-year history of achalasia and had a flaccid atonic esophagus noted.  Despite what appeared to be a good myotomy down onto the stomach, she persisted in having esophageal spasm and essentially not much of emptying of her esophagus.  She underwent endoscopy by Dr. Evette Cristal postoperatively and it appeared that the scope easily went into her stomach but there was basically no motility noted on that and on subsequent barium swallows.  She indicates that she was sent over to [facility] by Dr. Evette Cristal and there she underwent esophageal dilatation and Botox injection but I had no record of the details.  She was admitted here with concern because she said she had some fevers and chills and to rule out perforation.  The patient underwent a CT scan on admission which did not show any evidence of a perforation or free air. She does have gallstones.  PAST MEDICAL HISTORY:  Significant for bipolar disease, depression, achalasia.  ALLERGIES:  PENICILLIN, ERYTHROMYCIN and REGLAN.  HOSPITAL COURSE:  Following admission the patient was kept initially on just n.p.o. and then was started on some clear liquids.  She was placed back on Protonix IV.  She wanted to go ahead and try soft foods which she has had on.  Her IV came out on February 27.  She has already  got manometry scheduled over at [facility] on Wednesday, February 29. She is thought to be well enough for discharge today so that she can have manometry done tomorrow and we can determine what kind of propulsion remains in her esophageal body.  PLAN:  Discharge.  Will follow up with me or Dr. Evette Cristal following manometry or if the patient needs further esophageal resection would either refer that to the cardiothoracic surgeons or the surgeons at [facility] if they wish to pursue that.     Thornton Park Daphine Deutscher, MD     MBM/MEDQ  D:  10/08/2010  T:  10/08/2010  Job:  [id number]  cc:   Dr. Catheryn Bacon Department of Gastroenterology [facility]  Graylin Shiver, M.D. Fax: [phone]  Electronically Signed by Luretha Murphy MD on 10/14/2010 01:35:11 PM

## 2010-10-21 ENCOUNTER — Encounter (INDEPENDENT_AMBULATORY_CARE_PROVIDER_SITE_OTHER): Payer: Self-pay | Admitting: Internal Medicine

## 2010-10-21 LAB — BASIC METABOLIC PANEL
BUN: 2 mg/dL — ABNORMAL LOW (ref 6–23)
BUN: 5 mg/dL — ABNORMAL LOW (ref 6–23)
BUN: 7 mg/dL (ref 6–23)
BUN: 8 mg/dL (ref 6–23)
CO2: 26 mEq/L (ref 19–32)
CO2: 27 mEq/L (ref 19–32)
CO2: 30 mEq/L (ref 19–32)
CO2: 31 mEq/L (ref 19–32)
Calcium: 8.6 mg/dL (ref 8.4–10.5)
Calcium: 9 mg/dL (ref 8.4–10.5)
Calcium: 9.1 mg/dL (ref 8.4–10.5)
Calcium: 9.9 mg/dL (ref 8.4–10.5)
Chloride: 102 mEq/L (ref 96–112)
Chloride: 104 mEq/L (ref 96–112)
Chloride: 105 mEq/L (ref 96–112)
Chloride: 106 mEq/L (ref 96–112)
Creatinine, Ser: 0.47 mg/dL (ref 0.4–1.2)
Creatinine, Ser: 0.53 mg/dL (ref 0.4–1.2)
Creatinine, Ser: 0.65 mg/dL (ref 0.4–1.2)
Creatinine, Ser: 0.66 mg/dL (ref 0.4–1.2)
GFR calc Af Amer: >60 mL/min (ref >60)
GFR calc Af Amer: >60 mL/min (ref >60)
GFR calc Af Amer: >60 mL/min (ref >60)
GFR calc Af Amer: >60 mL/min (ref >60)
GFR calc non Af Amer: >60 mL/min (ref >60)
GFR calc non Af Amer: >60 mL/min (ref >60)
GFR calc non Af Amer: >60 mL/min (ref >60)
GFR calc non Af Amer: >60 mL/min (ref >60)
Glucose, Bld: 85 mg/dL (ref 70–99)
Glucose, Bld: 96 mg/dL (ref 70–99)
Glucose, Bld: 96 mg/dL (ref 70–99)
Glucose, Bld: 98 mg/dL (ref 70–99)
Potassium: 4 mEq/L (ref 3.5–5.1)
Potassium: 4.1 mEq/L (ref 3.5–5.1)
Potassium: 4.4 mEq/L (ref 3.5–5.1)
Potassium: 4.5 mEq/L (ref 3.5–5.1)
Sodium: 138 mEq/L (ref 135–145)
Sodium: 139 mEq/L (ref 135–145)
Sodium: 140 mEq/L (ref 135–145)
Sodium: 143 mEq/L (ref 135–145)

## 2010-10-21 LAB — DIFFERENTIAL
Basophils Absolute: 0 10*3/uL (ref 0.0–0.1)
Basophils Absolute: 0 10*3/uL (ref 0.0–0.1)
Basophils Relative: 0 % (ref 0–1)
Basophils Relative: 0 % (ref 0–1)
Eosinophils Absolute: 0 10*3/uL (ref 0.0–0.7)
Eosinophils Absolute: 0.1 10*3/uL (ref 0.0–0.7)
Eosinophils Relative: 0 % (ref 0–5)
Eosinophils Relative: 3 % (ref 0–5)
Lymphocytes Relative: 38 % (ref 12–46)
Lymphocytes Relative: 9 % — ABNORMAL LOW (ref 12–46)
Lymphs Abs: 0.6 10*3/uL — ABNORMAL LOW (ref 0.7–4.0)
Lymphs Abs: 1.2 10*3/uL (ref 0.7–4.0)
Monocytes Absolute: 0.5 10*3/uL (ref 0.1–1.0)
Monocytes Absolute: 0.7 10*3/uL (ref 0.1–1.0)
Monocytes Relative: 11 % (ref 3–12)
Monocytes Relative: 15 % — ABNORMAL HIGH (ref 3–12)
Neutro Abs: 1.4 10*3/uL — ABNORMAL LOW (ref 1.7–7.7)
Neutro Abs: 5.2 10*3/uL (ref 1.7–7.7)
Neutrophils Relative %: 44 % (ref 43–77)
Neutrophils Relative %: 81 % — ABNORMAL HIGH (ref 43–77)

## 2010-10-21 LAB — CBC
HCT: 26.5 % — ABNORMAL LOW (ref 36.0–46.0)
HCT: 27.7 % — ABNORMAL LOW (ref 36.0–46.0)
HCT: 28.1 % — ABNORMAL LOW (ref 36.0–46.0)
HCT: 29.2 % — ABNORMAL LOW (ref 36.0–46.0)
HCT: 32.4 % — ABNORMAL LOW (ref 36.0–46.0)
Hemoglobin: 11.1 g/dL — ABNORMAL LOW (ref 12.0–15.0)
Hemoglobin: 9.1 g/dL — ABNORMAL LOW (ref 12.0–15.0)
Hemoglobin: 9.2 g/dL — ABNORMAL LOW (ref 12.0–15.0)
Hemoglobin: 9.4 g/dL — ABNORMAL LOW (ref 12.0–15.0)
Hemoglobin: 9.8 g/dL — ABNORMAL LOW (ref 12.0–15.0)
MCH: 32.6 pg (ref 26.0–34.0)
MCH: 32.8 pg (ref 26.0–34.0)
MCH: 32.9 pg (ref 26.0–34.0)
MCH: 32.9 pg (ref 26.0–34.0)
MCH: 33.1 pg (ref 26.0–34.0)
MCHC: 33.2 g/dL (ref 30.0–36.0)
MCHC: 33.5 g/dL (ref 30.0–36.0)
MCHC: 33.6 g/dL (ref 30.0–36.0)
MCHC: 34.3 g/dL (ref 30.0–36.0)
MCHC: 34.3 g/dL (ref 30.0–36.0)
MCV: 95.9 fL (ref 78.0–100.0)
MCV: 96.4 fL (ref 78.0–100.0)
MCV: 97 fL (ref 78.0–100.0)
MCV: 98.3 fL (ref 78.0–100.0)
MCV: 98.9 fL (ref 78.0–100.0)
Platelets: 142 10*3/uL — ABNORMAL LOW (ref 150–400)
Platelets: 149 10*3/uL — ABNORMAL LOW (ref 150–400)
Platelets: 177 10*3/uL (ref 150–400)
Platelets: 204 10*3/uL (ref 150–400)
Platelets: 220 10*3/uL (ref 150–400)
RBC: 2.75 MIL/uL — ABNORMAL LOW (ref 3.87–5.11)
RBC: 2.8 MIL/uL — ABNORMAL LOW (ref 3.87–5.11)
RBC: 2.86 MIL/uL — ABNORMAL LOW (ref 3.87–5.11)
RBC: 3.01 MIL/uL — ABNORMAL LOW (ref 3.87–5.11)
RBC: 3.38 MIL/uL — ABNORMAL LOW (ref 3.87–5.11)
RDW: 12.2 % (ref 11.5–15.5)
RDW: 12.4 % (ref 11.5–15.5)
RDW: 12.4 % (ref 11.5–15.5)
RDW: 12.5 % (ref 11.5–15.5)
RDW: 12.6 % (ref 11.5–15.5)
WBC: 3.2 10*3/uL — ABNORMAL LOW (ref 4.0–10.5)
WBC: 3.8 10*3/uL — ABNORMAL LOW (ref 4.0–10.5)
WBC: 4.2 10*3/uL (ref 4.0–10.5)
WBC: 4.3 10*3/uL (ref 4.0–10.5)
WBC: 6.5 10*3/uL (ref 4.0–10.5)

## 2010-10-21 LAB — PREGNANCY, URINE: Preg Test, Ur: NEGATIVE

## 2010-10-21 LAB — POTASSIUM
Potassium: 3.7 mEq/L (ref 3.5–5.1)
Potassium: 3.7 mEq/L (ref 3.5–5.1)

## 2010-10-21 LAB — CREATININE, SERUM
Creatinine, Ser: 0.5 mg/dL (ref 0.4–1.2)
Creatinine, Ser: 0.6 mg/dL (ref 0.4–1.2)
GFR calc Af Amer: >60 mL/min (ref >60)
GFR calc Af Amer: >60 mL/min (ref >60)
GFR calc non Af Amer: >60 mL/min (ref >60)
GFR calc non Af Amer: >60 mL/min (ref >60)

## 2010-10-21 LAB — BUN
BUN: 4 mg/dL — ABNORMAL LOW (ref 6–23)
BUN: 5 mg/dL — ABNORMAL LOW (ref 6–23)

## 2010-10-21 LAB — SURGICAL PCR SCREEN
MRSA, PCR: NEGATIVE
Staphylococcus aureus: NEGATIVE

## 2010-10-22 LAB — CBC
Hemoglobin: 8.8 g/dL — ABNORMAL LOW (ref 12.0–15.0)
MCH: 34.3 pg — ABNORMAL HIGH (ref 26.0–34.0)
MCHC: 35.1 g/dL (ref 30.0–36.0)
MCV: 97.4 fL (ref 78.0–100.0)
MCV: 97.7 fL (ref 78.0–100.0)
MCV: 97.8 fL (ref 78.0–100.0)
Platelets: 156 10*3/uL (ref 150–400)
Platelets: 248 10*3/uL (ref 150–400)
RBC: 2.56 MIL/uL — ABNORMAL LOW (ref 3.87–5.11)
RBC: 2.61 MIL/uL — ABNORMAL LOW (ref 3.87–5.11)
RDW: 12.4 % (ref 11.5–15.5)
RDW: 12.7 % (ref 11.5–15.5)
WBC: 3.9 10*3/uL — ABNORMAL LOW (ref 4.0–10.5)
WBC: 4.2 10*3/uL (ref 4.0–10.5)

## 2010-10-22 LAB — URINALYSIS, ROUTINE W REFLEX MICROSCOPIC
Glucose, UA: NEGATIVE mg/dL
Hgb urine dipstick: NEGATIVE
Ketones, ur: NEGATIVE mg/dL
Protein, ur: NEGATIVE mg/dL
pH: 6 (ref 5.0–8.0)

## 2010-10-22 LAB — COMPREHENSIVE METABOLIC PANEL
AST: 10 U/L (ref 0–37)
Albumin: 3 g/dL — ABNORMAL LOW (ref 3.5–5.2)
Alkaline Phosphatase: 38 U/L — ABNORMAL LOW (ref 39–117)
BUN: 6 mg/dL (ref 6–23)
BUN: 8 mg/dL (ref 6–23)
CO2: 23 mEq/L (ref 19–32)
Calcium: 7.7 mg/dL — ABNORMAL LOW (ref 8.4–10.5)
Chloride: 113 mEq/L — ABNORMAL HIGH (ref 96–112)
Chloride: 114 mEq/L — ABNORMAL HIGH (ref 96–112)
Creatinine, Ser: 0.56 mg/dL (ref 0.4–1.2)
GFR calc Af Amer: >60 mL/min (ref >60)
GFR calc non Af Amer: >60 mL/min (ref >60)
Potassium: 3.9 mEq/L (ref 3.5–5.1)
Total Bilirubin: 0.7 mg/dL (ref 0.3–1.2)
Total Protein: 5.3 g/dL — ABNORMAL LOW (ref 6.0–8.3)

## 2010-10-22 LAB — POCT I-STAT, CHEM 8
BUN: 15 mg/dL (ref 6–23)
Calcium, Ion: 1.17 mmol/L (ref 1.12–1.32)
HCT: 33 % — ABNORMAL LOW (ref 36.0–46.0)
Hemoglobin: 11.2 g/dL — ABNORMAL LOW (ref 12.0–15.0)
Sodium: 141 mEq/L (ref 135–145)
TCO2: 24 mmol/L (ref 0–100)

## 2010-10-22 LAB — POCT PREGNANCY, URINE: Preg Test, Ur: NEGATIVE

## 2010-10-22 LAB — BASIC METABOLIC PANEL
Calcium: 8.4 mg/dL (ref 8.4–10.5)
Creatinine, Ser: 0.56 mg/dL (ref 0.4–1.2)
GFR calc Af Amer: >60 mL/min (ref >60)
GFR calc non Af Amer: >60 mL/min (ref >60)

## 2010-10-22 LAB — URINE MICROSCOPIC-ADD ON

## 2010-10-22 LAB — MAGNESIUM: Magnesium: 1.7 mg/dL (ref 1.5–2.5)

## 2010-10-22 LAB — CORTISOL-AM, BLOOD: Cortisol - AM: 4.8 ug/dL (ref 4.3–22.4)

## 2010-10-22 LAB — TYPE AND SCREEN
ABO/RH(D): A POS
Antibody Screen: NEGATIVE

## 2010-10-22 LAB — TSH: TSH: 0.733 u[IU]/mL (ref 0.350–4.500)

## 2010-10-22 NOTE — Letter (Signed)
[facility]   Imported By: Arta Bruce 10/14/2010 14:59:51  _____________________________________________________________________  External Attachment:    Type:   Image     Comment:   External Document

## 2010-10-27 LAB — BASIC METABOLIC PANEL
BUN: 4 mg/dL — ABNORMAL LOW (ref 6–23)
BUN: 6 mg/dL (ref 6–23)
BUN: 7 mg/dL (ref 6–23)
CO2: 25 mEq/L (ref 19–32)
CO2: 26 mEq/L (ref 19–32)
Calcium: 7.9 mg/dL — ABNORMAL LOW (ref 8.4–10.5)
Chloride: 107 mEq/L (ref 96–112)
Chloride: 111 mEq/L (ref 96–112)
Creatinine, Ser: 0.56 mg/dL (ref 0.4–1.2)
GFR calc non Af Amer: >60 mL/min (ref >60)
Glucose, Bld: 105 mg/dL — ABNORMAL HIGH (ref 70–99)
Glucose, Bld: 62 mg/dL — ABNORMAL LOW (ref 70–99)
Glucose, Bld: 76 mg/dL (ref 70–99)
Potassium: 3.8 mEq/L (ref 3.5–5.1)
Potassium: 4.2 mEq/L (ref 3.5–5.1)
Sodium: 141 mEq/L (ref 135–145)
Sodium: 141 mEq/L (ref 135–145)

## 2010-10-27 LAB — CBC
HCT: 27.6 % — ABNORMAL LOW (ref 36.0–46.0)
HCT: 29.9 % — ABNORMAL LOW (ref 36.0–46.0)
Hemoglobin: 9.1 g/dL — ABNORMAL LOW (ref 12.0–15.0)
Hemoglobin: 9.6 g/dL — ABNORMAL LOW (ref 12.0–15.0)
MCH: 34.1 pg — ABNORMAL HIGH (ref 26.0–34.0)
MCH: 34.4 pg — ABNORMAL HIGH (ref 26.0–34.0)
MCHC: 34.8 g/dL (ref 30.0–36.0)
MCHC: 35 g/dL (ref 30.0–36.0)
MCHC: 35.3 g/dL (ref 30.0–36.0)
MCV: 97.4 fL (ref 78.0–100.0)
MCV: 97.5 fL (ref 78.0–100.0)
Platelets: 172 10*3/uL (ref 150–400)
RDW: 13.4 % (ref 11.5–15.5)
RDW: 13.6 % (ref 11.5–15.5)
RDW: 13.7 % (ref 11.5–15.5)

## 2010-10-27 LAB — DIFFERENTIAL
Basophils Absolute: 0 10*3/uL (ref 0.0–0.1)
Basophils Relative: 0 % (ref 0–1)
Eosinophils Relative: 0 % (ref 0–5)
Monocytes Absolute: 0.4 10*3/uL (ref 0.1–1.0)

## 2010-10-27 LAB — URINE CULTURE
Colony Count: NO GROWTH
Culture: NO GROWTH

## 2010-10-27 LAB — POCT URINALYSIS DIP (DEVICE)
Bilirubin Urine: NEGATIVE
Protein, ur: NEGATIVE mg/dL
Specific Gravity, Urine: 1.02 (ref 1.005–1.030)
pH: 6 (ref 5.0–8.0)

## 2010-10-27 LAB — URINALYSIS, ROUTINE W REFLEX MICROSCOPIC
Bilirubin Urine: NEGATIVE
Nitrite: NEGATIVE
Protein, ur: NEGATIVE mg/dL
Specific Gravity, Urine: 1.013 (ref 1.005–1.030)
Urobilinogen, UA: 1 mg/dL (ref 0.0–1.0)

## 2010-10-27 LAB — HEPATIC FUNCTION PANEL: Bilirubin, Direct: <0.1 mg/dL (ref 0.0–0.3)

## 2010-10-27 LAB — GLUCOSE, CAPILLARY: Glucose-Capillary: 106 mg/dL — ABNORMAL HIGH (ref 70–99)

## 2010-10-27 LAB — RAPID URINE DRUG SCREEN, HOSP PERFORMED
Cocaine: NOT DETECTED
Opiates: POSITIVE — AB

## 2010-10-27 LAB — MAGNESIUM: Magnesium: 2 mg/dL (ref 1.5–2.5)

## 2010-10-27 LAB — PHOSPHORUS: Phosphorus: 2.9 mg/dL (ref 2.3–4.6)

## 2010-10-28 ENCOUNTER — Encounter (INDEPENDENT_AMBULATORY_CARE_PROVIDER_SITE_OTHER): Payer: Self-pay | Admitting: Internal Medicine

## 2010-11-07 NOTE — Letter (Signed)
Summary[facility]   Imported By: Arta Bruce 10/31/2010 15:17:11  _____________________________________________________________________  External Attachment:    Type:   Image     Comment:   External Document

## 2010-11-07 NOTE — Letter (Signed)
[facility]   Imported By: Arta Bruce 10/30/2010 14:30:25  _____________________________________________________________________  External Attachment:    Type:   Image     Comment:   External Document

## 2010-11-13 LAB — CBC
HCT: 24.8 % — ABNORMAL LOW (ref 36.0–46.0)
HCT: 26.5 % — ABNORMAL LOW (ref 36.0–46.0)
HCT: 28.1 % — ABNORMAL LOW (ref 36.0–46.0)
Hemoglobin: 9 g/dL — ABNORMAL LOW (ref 12.0–15.0)
MCHC: 34.9 g/dL (ref 30.0–36.0)
MCV: 95.9 fL (ref 78.0–100.0)
MCV: 96.4 fL (ref 78.0–100.0)
MCV: 96.8 fL (ref 78.0–100.0)
Platelets: 214 10*3/uL (ref 150–400)
Platelets: 269 10*3/uL (ref 150–400)
Platelets: 254 10*3/uL (ref 150–400)
RBC: 2.57 MIL/uL — ABNORMAL LOW (ref 3.87–5.11)
RBC: 2.73 MIL/uL — ABNORMAL LOW (ref 3.87–5.11)
RBC: 3.43 MIL/uL — ABNORMAL LOW (ref 3.87–5.11)
RDW: 12.7 % (ref 11.5–15.5)
RDW: 12.8 % (ref 11.5–15.5)
RDW: 12 % (ref 11.5–15.5)
WBC: 2.9 10*3/uL — ABNORMAL LOW (ref 4.0–10.5)
WBC: 4.8 10*3/uL (ref 4.0–10.5)
WBC: 5.1 10*3/uL (ref 4.0–10.5)
WBC: 6.1 10*3/uL (ref 4.0–10.5)

## 2010-11-13 LAB — GLUCOSE, CAPILLARY
Glucose-Capillary: 111 mg/dL — ABNORMAL HIGH (ref 70–99)
Glucose-Capillary: 44 mg/dL — ABNORMAL LOW (ref 70–99)

## 2010-11-13 LAB — COMPREHENSIVE METABOLIC PANEL
ALT: 10 U/L (ref 0–35)
AST: 13 U/L (ref 0–37)
Albumin: 4 g/dL (ref 3.5–5.2)
Alkaline Phosphatase: 56 U/L (ref 39–117)
Alkaline Phosphatase: 50 U/L (ref 39–117)
BUN: 2 mg/dL — ABNORMAL LOW (ref 6–23)
BUN: 5 mg/dL — ABNORMAL LOW (ref 6–23)
CO2: 26 mEq/L (ref 19–32)
Calcium: 8.1 mg/dL — ABNORMAL LOW (ref 8.4–10.5)
Calcium: 9.1 mg/dL (ref 8.4–10.5)
Chloride: 109 mEq/L (ref 96–112)
Chloride: 107 mEq/L (ref 96–112)
Creatinine, Ser: 0.59 mg/dL (ref 0.4–1.2)
Creatinine, Ser: 0.55 mg/dL (ref 0.4–1.2)
GFR calc Af Amer: >60 mL/min (ref >60)
GFR calc non Af Amer: >60 mL/min (ref >60)
GFR calc non Af Amer: >60 mL/min (ref >60)
Glucose, Bld: 56 mg/dL — ABNORMAL LOW (ref 70–99)
Glucose, Bld: 75 mg/dL (ref 70–99)
Potassium: 4.2 mEq/L (ref 3.5–5.1)
Potassium: 3.7 mEq/L (ref 3.5–5.1)
Sodium: 139 mEq/L (ref 135–145)
Total Bilirubin: 1.4 mg/dL — ABNORMAL HIGH (ref 0.3–1.2)
Total Protein: 5.6 g/dL — ABNORMAL LOW (ref 6.0–8.3)

## 2010-11-13 LAB — BASIC METABOLIC PANEL
BUN: 1 mg/dL — ABNORMAL LOW (ref 6–23)
BUN: <1 mg/dL — ABNORMAL LOW (ref 6–23)
CO2: 32 mEq/L (ref 19–32)
Chloride: 106 mEq/L (ref 96–112)
Chloride: 111 mEq/L (ref 96–112)
GFR calc Af Amer: >60 mL/min (ref >60)
GFR calc non Af Amer: >60 mL/min (ref >60)
Glucose, Bld: 102 mg/dL — ABNORMAL HIGH (ref 70–99)
Glucose, Bld: 89 mg/dL (ref 70–99)
Potassium: 3.4 mEq/L — ABNORMAL LOW (ref 3.5–5.1)
Potassium: 3.6 mEq/L (ref 3.5–5.1)
Potassium: 3.8 mEq/L (ref 3.5–5.1)
Sodium: 137 mEq/L (ref 135–145)

## 2010-11-13 LAB — URINE MICROSCOPIC-ADD ON

## 2010-11-13 LAB — MAGNESIUM: Magnesium: 1.6 mg/dL (ref 1.5–2.5)

## 2010-11-13 LAB — DIFFERENTIAL
Eosinophils Absolute: 0 10*3/uL (ref 0.0–0.7)
Eosinophils Relative: 0 % (ref 0–5)
Lymphs Abs: 0.7 10*3/uL (ref 0.7–4.0)
Monocytes Absolute: 0.3 10*3/uL (ref 0.1–1.0)
Monocytes Relative: 6 % (ref 3–12)

## 2010-11-13 LAB — URINALYSIS, ROUTINE W REFLEX MICROSCOPIC
Glucose, UA: NEGATIVE mg/dL
Protein, ur: 30 mg/dL — AB

## 2010-11-13 LAB — PREGNANCY, URINE: Preg Test, Ur: NEGATIVE

## 2010-11-13 LAB — RETICULOCYTES
RBC.: 3.08 MIL/uL — ABNORMAL LOW (ref 3.87–5.11)
Retic Ct Pct: 2 % (ref 0.4–3.1)

## 2010-11-13 LAB — PHOSPHORUS: Phosphorus: 3.1 mg/dL (ref 2.3–4.6)

## 2010-11-13 LAB — PREALBUMIN: Prealbumin: 9.9 mg/dL — ABNORMAL LOW (ref 18.0–45.0)

## 2010-11-19 LAB — URINALYSIS, ROUTINE W REFLEX MICROSCOPIC
Ketones, ur: NEGATIVE mg/dL
Nitrite: NEGATIVE
pH: 6.5 (ref 5.0–8.0)

## 2010-11-19 LAB — COMPREHENSIVE METABOLIC PANEL
ALT: 11 U/L (ref 0–35)
AST: 14 U/L (ref 0–37)
Albumin: 3.8 g/dL (ref 3.5–5.2)
Calcium: 9.1 mg/dL (ref 8.4–10.5)
GFR calc Af Amer: >60 mL/min (ref >60)
Sodium: 137 mEq/L (ref 135–145)
Total Protein: 6.9 g/dL (ref 6.0–8.3)

## 2010-11-19 LAB — DIFFERENTIAL
Eosinophils Absolute: 0 10*3/uL (ref 0.0–0.7)
Eosinophils Relative: 0 % (ref 0–5)
Lymphs Abs: 1 10*3/uL (ref 0.7–4.0)
Monocytes Relative: 5 % (ref 3–12)

## 2010-11-19 LAB — CBC
MCHC: 35.4 g/dL (ref 30.0–36.0)
RBC: 3.64 MIL/uL — ABNORMAL LOW (ref 3.87–5.11)
RDW: 12.7 % (ref 11.5–15.5)

## 2011-05-05 LAB — URINALYSIS, ROUTINE W REFLEX MICROSCOPIC
Bilirubin Urine: NEGATIVE
Nitrite: NEGATIVE
Specific Gravity, Urine: 1.017
Urobilinogen, UA: 1

## 2011-05-05 LAB — PREGNANCY, URINE: Preg Test, Ur: NEGATIVE

## 2011-05-16 LAB — I-STAT 8, (EC8 V) (CONVERTED LAB)
Acid-base deficit: 2
Glucose, Bld: 83
Hemoglobin: 11.2 — ABNORMAL LOW
Potassium: 3.9
Sodium: 138
TCO2: 24

## 2011-05-16 LAB — DIFFERENTIAL
Basophils Relative: 0
Lymphocytes Relative: 9 — ABNORMAL LOW
Monocytes Relative: 5
Neutro Abs: 7.7
Neutrophils Relative %: 86 — ABNORMAL HIGH

## 2011-05-16 LAB — CBC
MCHC: 35.5
RBC: 3.22 — ABNORMAL LOW
WBC: 9

## 2011-05-16 LAB — POCT I-STAT CREATININE: Creatinine, Ser: 0.7

## 2011-05-22 LAB — CBC
Hemoglobin: 7.8 — CL
Platelets: 254
RBC: 2.27 — ABNORMAL LOW
RBC: 3.15 — ABNORMAL LOW
WBC: 6.6
WBC: 9

## 2011-05-22 LAB — RAPID HIV SCREEN (WH-MAU): Rapid HIV Screen: NONREACTIVE

## 2011-05-31 IMAGING — CR DG CHEST 2V
2 series · 2 of 2 positions shown · non-contrast
Comparison: 08/04/2007

CLINICAL DATA: Cough.  Midsternal chest pain.

CHEST - 2 VIEW

[view not recorded (1 of 2)]
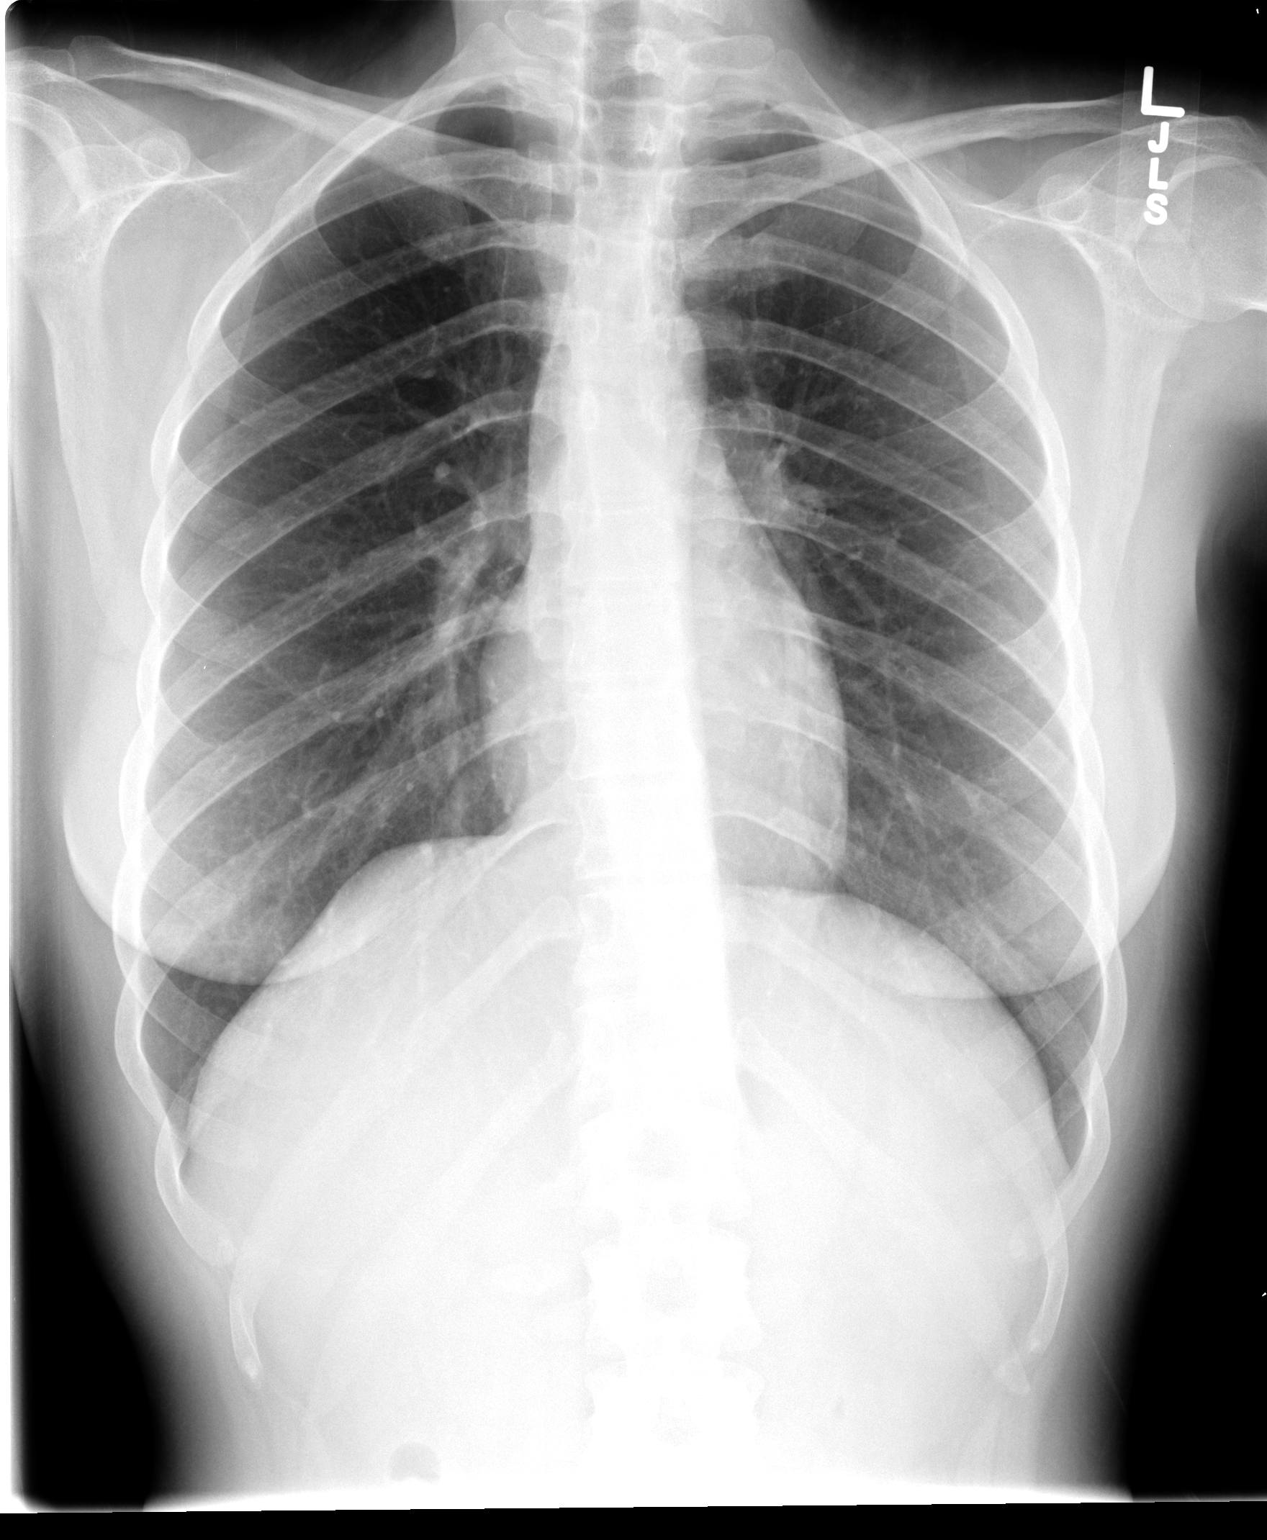

[view not recorded (2 of 2)]
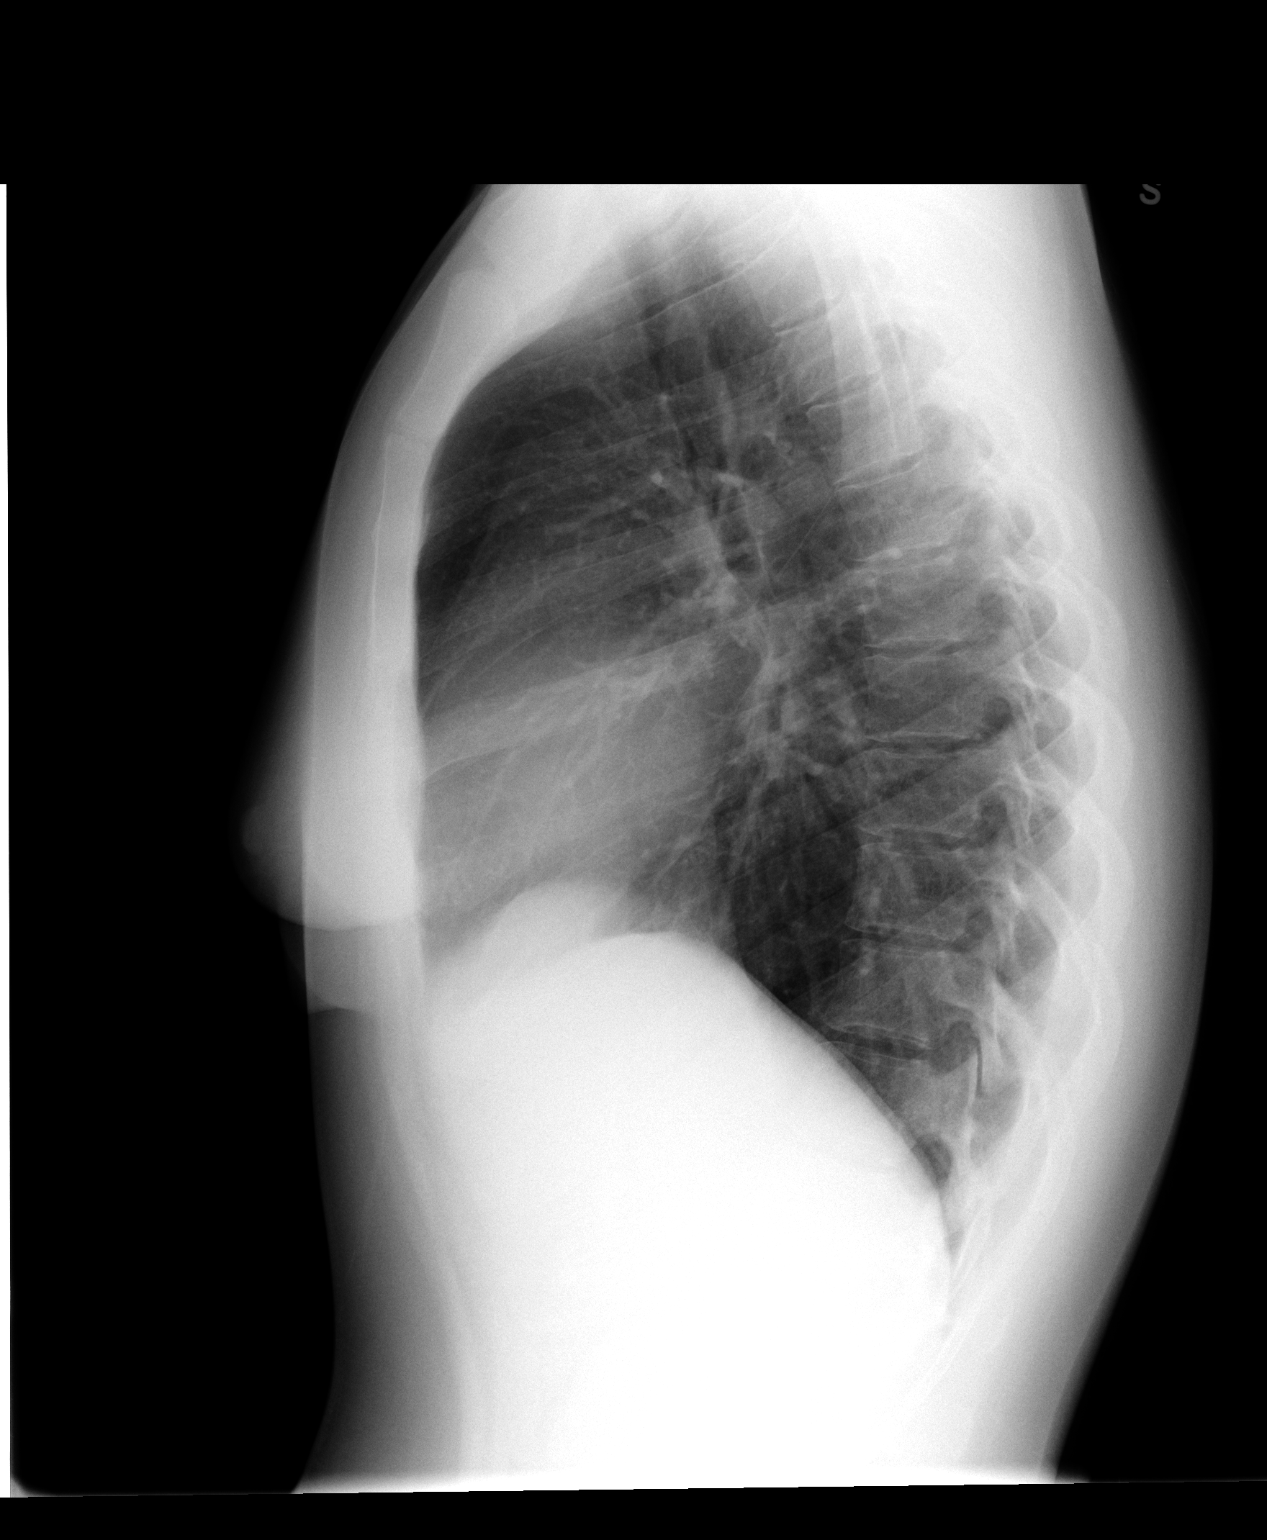

[2 of 2 positions shown; findings below may reference images not displayed]

FINDINGS: Cardiac and mediastinal contours appear normal.

The lungs appear clear.

No pleural effusion is identified.
IMPRESSION: 1.  No acute thoracic findings are identified.

## 2011-06-11 DIAGNOSIS — F603 Borderline personality disorder: Secondary | ICD-10-CM | POA: Insufficient documentation

## 2013-05-22 DIAGNOSIS — K2970 Gastritis, unspecified, without bleeding: Secondary | ICD-10-CM | POA: Insufficient documentation

## 2013-05-22 DIAGNOSIS — R112 Nausea with vomiting, unspecified: Secondary | ICD-10-CM | POA: Insufficient documentation

## 2013-05-22 DIAGNOSIS — R1013 Epigastric pain: Secondary | ICD-10-CM | POA: Insufficient documentation

## 2015-04-20 DIAGNOSIS — K30 Functional dyspepsia: Secondary | ICD-10-CM | POA: Insufficient documentation

## 2016-03-13 DIAGNOSIS — F329 Major depressive disorder, single episode, unspecified: Secondary | ICD-10-CM | POA: Insufficient documentation

## 2016-03-13 DIAGNOSIS — F32A Depression, unspecified: Secondary | ICD-10-CM | POA: Insufficient documentation

## 2016-03-13 DIAGNOSIS — F419 Anxiety disorder, unspecified: Secondary | ICD-10-CM | POA: Insufficient documentation

## 2016-03-15 DIAGNOSIS — D649 Anemia, unspecified: Secondary | ICD-10-CM | POA: Insufficient documentation

## 2016-03-15 DIAGNOSIS — Z79899 Other long term (current) drug therapy: Secondary | ICD-10-CM | POA: Insufficient documentation

## 2016-03-15 DIAGNOSIS — Z862 Personal history of diseases of the blood and blood-forming organs and certain disorders involving the immune mechanism: Secondary | ICD-10-CM | POA: Insufficient documentation

## 2016-09-25 ENCOUNTER — Emergency Department
Admission: EM | Admit: 2016-09-25 | Discharge: 2016-09-25 | Disposition: A | Discharge status: Home / Self Care | Payer: Medicare Other | Attending: Emergency Medicine | Admitting: Emergency Medicine

## 2016-09-25 ENCOUNTER — Encounter: Payer: Self-pay | Admitting: Emergency Medicine

## 2016-09-25 DIAGNOSIS — R109 Unspecified abdominal pain: Secondary | ICD-10-CM | POA: Diagnosis not present

## 2016-09-25 DIAGNOSIS — R112 Nausea with vomiting, unspecified: Secondary | ICD-10-CM | POA: Diagnosis present

## 2016-09-25 LAB — LIPASE, BLOOD: Lipase: 27 U/L (ref 11–51)

## 2016-09-25 LAB — COMPREHENSIVE METABOLIC PANEL
ALBUMIN: 4.1 g/dL (ref 3.5–5.0)
ALT: 17 U/L (ref 14–54)
AST: 22 U/L (ref 15–41)
Alkaline Phosphatase: 55 U/L (ref 38–126)
Anion gap: 5 (ref 5–15)
BILIRUBIN TOTAL: 0.5 mg/dL (ref 0.3–1.2)
BUN: 12 mg/dL (ref 6–20)
CHLORIDE: 105 mmol/L (ref 101–111)
CO2: 29 mmol/L (ref 22–32)
CREATININE: 0.62 mg/dL (ref 0.44–1.00)
Calcium: 9.2 mg/dL (ref 8.9–10.3)
GFR calc Af Amer: >60 mL/min (ref >60)
GLUCOSE: 99 mg/dL (ref 65–99)
POTASSIUM: 4.4 mmol/L (ref 3.5–5.1)
Sodium: 139 mmol/L (ref 135–145)
TOTAL PROTEIN: 7.5 g/dL (ref 6.5–8.1)

## 2016-09-25 LAB — CBC
HEMATOCRIT: 30.1 % — AB (ref 35.0–47.0)
Hemoglobin: 10.2 g/dL — ABNORMAL LOW (ref 12.0–16.0)
MCH: 30.4 pg (ref 26.0–34.0)
MCHC: 33.7 g/dL (ref 32.0–36.0)
MCV: 90.1 fL (ref 80.0–100.0)
PLATELETS: 214 10*3/uL (ref 150–440)
RBC: 3.35 MIL/uL — ABNORMAL LOW (ref 3.80–5.20)
RDW: 16.1 % — ABNORMAL HIGH (ref 11.5–14.5)
WBC: 3.4 10*3/uL — AB (ref 3.6–11.0)

## 2016-09-25 LAB — URINALYSIS, COMPLETE (UACMP) WITH MICROSCOPIC
BACTERIA UA: NONE SEEN
Bilirubin Urine: NEGATIVE
Glucose, UA: NEGATIVE mg/dL
Hgb urine dipstick: NEGATIVE
Ketones, ur: NEGATIVE mg/dL
Nitrite: NEGATIVE
PH: 6 (ref 5.0–8.0)
Protein, ur: NEGATIVE mg/dL
Specific Gravity, Urine: 1.026 (ref 1.005–1.030)

## 2016-09-25 LAB — PREGNANCY, URINE: PREG TEST UR: NEGATIVE

## 2016-09-25 MED ORDER — GI COCKTAIL ~~LOC~~
30.0000 mL | Freq: Once | ORAL | Status: AC
  Administered 2016-09-25: 30 mL via ORAL
  Filled 2016-09-25: qty 30

## 2016-09-25 MED ORDER — PROCHLORPERAZINE EDISYLATE 5 MG/ML IJ SOLN
10.0000 mg | Freq: Once | INTRAMUSCULAR | Status: DC
  Filled 2016-09-25 (×2): qty 2

## 2016-09-25 MED ORDER — PROCHLORPERAZINE EDISYLATE 5 MG/ML IJ SOLN
10.0000 mg | Freq: Once | INTRAMUSCULAR | Status: AC
  Administered 2016-09-25: 10 mg via INTRAMUSCULAR
  Filled 2016-09-25: qty 2

## 2016-09-25 MED ORDER — ONDANSETRON 4 MG PO TBDP
4.0000 mg | ORAL_TABLET | Freq: Once | ORAL | Status: AC
  Administered 2016-09-25: 4 mg via ORAL

## 2016-09-25 MED ORDER — ONDANSETRON 4 MG PO TBDP
ORAL_TABLET | ORAL | Status: AC
  Filled 2016-09-25: qty 1

## 2016-09-25 MED ORDER — PROCHLORPERAZINE MALEATE 10 MG PO TABS: 10.0000 mg | ORAL_TABLET | Freq: Three times a day (TID) | ORAL | 0 refills | Status: DC | PRN

## 2016-09-25 NOTE — ED Notes (Signed)
Patient referred to [facility] and given cardinal card as well as the [address] number to facilitate detoxing from opioids.  Patient verbalized understanding.

## 2016-09-25 NOTE — ED Provider Notes (Signed)
[facility] Emergency Department Provider Note   ____________________________________________   I have reviewed the triage vital signs and the nursing notes.   HISTORY  Chief Complaint Emesis   History limited by: Not Limited   HPI DEEANNE DEININGER is a 36 y.o. female who presents to the emergency department today because of concerns for nausea, vomiting and chest pain. Patient states that she has a long history of issues with her esophagus having had it taken out secondary to achalasia. She states that ever since then she has had intermittent issues with gastritis. She is on antiacids, nausea medicine and pain medicine. She follows with [facility]. She denies any fevers.   No past medical history on file.  Patient Active Problem List   Diagnosis Date Noted  . VITAMIN B12 DEFICIENCY 04/04/2010  . KNEE PAIN, BILATERAL 04/04/2010  . LEUKOPENIA, MILD 10/13/2009  . ACUTE BRONCHOSPASM 09/27/2009  . FATIGUE 09/27/2009  . VAGINITIS, CANDIDAL 07/13/2009  . ANEMIA, IRON DEFICIENCY 07/13/2009  . BIPOLAR DISORDER UNSPECIFIED 07/13/2009  . ACHALASIA 07/13/2009    No past surgical history on file.  Prior to Admission medications   Not on File    Allergies Penicillins  No family history on file.  Social History Social History  Substance Use Topics  . Smoking status: Never Smoker  . Smokeless tobacco: Never Used  . Alcohol use No    Review of Systems  Constitutional: Negative for fever. Cardiovascular: Positive for chest pain. Respiratory: Negative for shortness of breath. Gastrointestinal: Positive for abdominal pain, nausea and vomiting. Genitourinary: Negative for dysuria. Neurological: Negative for headaches, focal weakness or numbness.  10-point ROS otherwise negative.  ____________________________________________   PHYSICAL EXAM:  VITAL SIGNS: ED Triage Vitals  Enc Vitals Group     BP 09/25/16 1255 113/72     Pulse Rate 09/25/16 1255  77     Resp 09/25/16 1255 20     Temp 09/25/16 1255 98.7 F (37.1 C)     Temp Source 09/25/16 1255 Oral     SpO2 09/25/16 1255 99 %     Weight 09/25/16 1256 160 lb (72.6 kg)     Height 09/25/16 1256 5\' 3"  (1.6 m)     Head Circumference --      Peak Flow --      Pain Score 09/25/16 1257 7   Constitutional: Alert and oriented. Well appearing and in no distress. Eyes: Conjunctivae are normal. Normal extraocular movements. ENT   Head: Normocephalic and atraumatic.   Nose: No congestion/rhinnorhea.   Mouth/Throat: Mucous membranes are moist.   Neck: No stridor. Hematological/Lymphatic/Immunilogical: No cervical lymphadenopathy. Cardiovascular: Normal rate, regular rhythm.  No murmurs, rubs, or gallops.  Respiratory: Normal respiratory effort without tachypnea nor retractions. Breath sounds are clear and equal bilaterally. No wheezes/rales/rhonchi. Gastrointestinal: Soft and non tender. No rebound. No guarding.  Genitourinary: Deferred Musculoskeletal: Normal range of motion in all extremities. No lower extremity edema. Neurologic:  Normal speech and language. No gross focal neurologic deficits are appreciated.  Skin:  Skin is warm, dry and intact. No rash noted. Psychiatric: Mood and affect are normal. Speech and behavior are normal. Patient exhibits appropriate insight and judgment.  ____________________________________________    LABS (pertinent positives/negatives)  Labs Reviewed  CBC - Abnormal; Notable for the following:       Result Value   WBC 3.4 (*)    RBC 3.35 (*)    Hemoglobin 10.2 (*)    HCT 30.1 (*)    RDW 16.1 (*)  All other components within normal limits  URINALYSIS, COMPLETE (UACMP) WITH MICROSCOPIC - Abnormal; Notable for the following:    Color, Urine YELLOW (*)    APPearance CLEAR (*)    Leukocytes, UA MODERATE (*)    Squamous Epithelial / LPF 0-5 (*)    All other components within normal limits  URINE CULTURE  LIPASE, BLOOD   COMPREHENSIVE METABOLIC PANEL  PREGNANCY, URINE     ____________________________________________   EKG  None  ____________________________________________    RADIOLOGY  None   ____________________________________________   PROCEDURES  Procedures  ____________________________________________   INITIAL IMPRESSION / ASSESSMENT AND PLAN / ED COURSE  Pertinent labs & imaging results that were available during my care of the patient were reviewed by me and considered in my medical decision making (see chart for details).  Patient presented to the emergency department today because of concerns for nausea vomiting and gastritis like symptoms. Patient states she has a long history of the same. On exam patient distress. Blood work without any concerning leukocytosis. Patient did have some signs of UTI and urine however no dysuria. Patient was given Compazine and GI cocktail with some improvement in her symptoms. Discussed with patient importance of following up with her GI doctors. Additionally patient was requesting information on opioid detox. Will give patient a RTS information.  ____________________________________________   FINAL CLINICAL IMPRESSION(S) / ED DIAGNOSES  Final diagnoses:  Nausea and vomiting, intractability of vomiting not specified, unspecified vomiting type     Note: This dictation was prepared with Dragon dictation. Any transcriptional errors that result from this process are unintentional     Phineas SemenGraydon Denarius Sesler, MD 09/25/16 1640

## 2016-09-25 NOTE — Discharge Instructions (Signed)
Please seek medical attention for any high fevers, chest pain, shortness of breath, change in behavior, persistent vomiting, bloody stool or any other new or concerning symptoms.  

## 2016-09-25 NOTE — ED Triage Notes (Signed)
Pt with vomiting for two days.

## 2016-09-27 LAB — URINE CULTURE

## 2017-05-19 ENCOUNTER — Ambulatory Visit: Payer: Self-pay | Admitting: Psychiatry

## 2017-05-19 ENCOUNTER — Ambulatory Visit (INDEPENDENT_AMBULATORY_CARE_PROVIDER_SITE_OTHER): Payer: Medicare Other | Admitting: Psychiatry

## 2017-05-19 ENCOUNTER — Encounter: Payer: Self-pay | Admitting: Psychiatry

## 2017-05-19 VITALS — BP 98/67 | HR 69 | Temp 98.4°F | Wt 167.8 lb

## 2017-05-19 DIAGNOSIS — F331 Major depressive disorder, recurrent, moderate: Secondary | ICD-10-CM | POA: Insufficient documentation

## 2017-05-19 DIAGNOSIS — F411 Generalized anxiety disorder: Secondary | ICD-10-CM | POA: Diagnosis not present

## 2017-05-19 DIAGNOSIS — Z8659 Personal history of other mental and behavioral disorders: Secondary | ICD-10-CM

## 2017-05-19 MED ORDER — VENLAFAXINE HCL ER 37.5 MG PO CP24: 37.5000 mg | ORAL_CAPSULE | Freq: Every day | ORAL | 0 refills | Status: DC

## 2017-05-19 MED ORDER — FLUOXETINE HCL 10 MG PO CAPS: 30.0000 mg | ORAL_CAPSULE | Freq: Every day | ORAL | 0 refills | Status: DC

## 2017-05-19 NOTE — Patient Instructions (Signed)
Venlafaxine tablets What is this medicine? VENLAFAXINE (VEN la fax een) is used to treat depression, anxiety and panic disorder. This medicine may be used for other purposes; ask your health care provider or pharmacist if you have questions. COMMON BRAND NAME(S): Effexor What should I tell my health care provider before I take this medicine? They need to know if you have any of these conditions: -bleeding disorders -glaucoma -heart disease -high blood pressure -high cholesterol -kidney disease -liver disease -low levels of sodium in the blood -mania or bipolar disorder -seizures -suicidal thoughts, plans, or attempt; a previous suicide attempt by you or a family -take medicines that treat or prevent blood clots -thyroid disease -an unusual or allergic reaction to venlafaxine, desvenlafaxine, other medicines, foods, dyes, or preservatives -pregnant or trying to get pregnant -breast-feeding How should I use this medicine? Take this medicine by mouth with a glass of water. Follow the directions on the prescription label. Take it with food. Take your medicine at regular intervals. Do not take your medicine more often than directed. Do not stop taking this medicine suddenly except upon the advice of your doctor. Stopping this medicine too quickly may cause serious side effects or your condition may worsen. A special MedGuide will be given to you by the pharmacist with each prescription and refill. Be sure to read this information carefully each time. Talk to your pediatrician regarding the use of this medicine in children. Special care may be needed. Overdosage: If you think you have taken too much of this medicine contact a poison control center or emergency room at once. NOTE: This medicine is only for you. Do not share this medicine with others. What if I miss a dose? If you miss a dose, take it as soon as you can. If it is almost time for your next dose, take only that dose. Do not take  double or extra doses. What may interact with this medicine? Do not take this medicine with any of the following medications: -certain medicines for fungal infections like fluconazole, itraconazole, ketoconazole, posaconazole, voriconazole -cisapride -desvenlafaxine -dofetilide -dronedarone -duloxetine -levomilnacipran -linezolid -MAOIs like Carbex, Eldepryl, Marplan, Nardil, and Parnate -methylene blue (injected into a vein) -milnacipran -pimozide -thioridazine -ziprasidone This medicine may also interact with the following medications: -amphetamines -aspirin and aspirin-like medicines -certain medicines for depression, anxiety, or psychotic disturbances -certain medicines for migraine headaches like almotriptan, eletriptan, frovatriptan, naratriptan, rizatriptan, sumatriptan, zolmitriptan -certain medicines for sleep -certain medicines that treat or prevent blood clots like dalteparin, enoxaparin, warfarin -cimetidine -clozapine -diuretics -fentanyl -furazolidone -indinavir -isoniazid -lithium -metoprolol -NSAIDS, medicines for pain and inflammation, like ibuprofen or naproxen -other medicines that prolong the QT interval (cause an abnormal heart rhythm) -procarbazine -rasagiline -supplements like St. John's wort, kava kava, valerian -tramadol -tryptophan This list may not describe all possible interactions. Give your health care provider a list of all the medicines, herbs, non-prescription drugs, or dietary supplements you use. Also tell them if you smoke, drink alcohol, or use illegal drugs. Some items may interact with your medicine. What should I watch for while using this medicine? Tell your doctor if your symptoms do not get better or if they get worse. Visit your doctor or health care professional for regular checks on your progress. Because it may take several weeks to see the full effects of this medicine, it is important to continue your treatment as prescribed  by your doctor. Patients and their families should watch out for new or worsening thoughts of suicide or depression. Also   watch out for sudden changes in feelings such as feeling anxious, agitated, panicky, irritable, hostile, aggressive, impulsive, severely restless, overly excited and hyperactive, or not being able to sleep. If this happens, especially at the beginning of treatment or after a change in dose, call your health care professional. This medicine can cause an increase in blood pressure. Check with your doctor for instructions on monitoring your blood pressure while taking this medicine. You may get drowsy or dizzy. Do not drive, use machinery, or do anything that needs mental alertness until you know how this medicine affects you. Do not stand or sit up quickly, especially if you are an older patient. This reduces the risk of dizzy or fainting spells. Alcohol may interfere with the effect of this medicine. Avoid alcoholic drinks. Your mouth may get dry. Chewing sugarless gum, sucking hard candy and drinking plenty of water will help. Contact your doctor if the problem does not go away or is severe. What side effects may I notice from receiving this medicine? Side effects that you should report to your doctor or health care professional as soon as possible: -allergic reactions like skin rash, itching or hives, swelling of the face, lips, or tongue -anxious -breathing problems -confusion -changes in vision -chest pain -confusion -elevated mood, decreased need for sleep, racing thoughts, impulsive behavior -eye pain -fast, irregular heartbeat -feeling faint or lightheaded, falls -feeling agitated, angry, or irritable -hallucination, loss of contact with reality -high blood pressure -loss of balance or coordination -palpitations -redness, blistering, peeling or loosening of the skin, including inside the mouth -restlessness, pacing, inability to keep still -seizures -stiff  muscles -suicidal thoughts or other mood changes -trouble passing urine or change in the amount of urine -trouble sleeping -unusual bleeding or bruising -unusually weak or tired -vomiting Side effects that usually do not require medical attention (report to your doctor or health care professional if they continue or are bothersome): -change in sex drive or performance -change in appetite or weight -constipation -dizziness -dry mouth -headache -increased sweating -nausea -tired This list may not describe all possible side effects. Call your doctor for medical advice about side effects. You may report side effects to FDA at 1-800-FDA-1088. Where should I keep my medicine? Keep out of the reach of children. Store at a controlled temperature between 20 and 25 degrees C (68 and 77 degrees F), in a dry place. Throw away any unused medicine after the expiration date. NOTE: This sheet is a summary. It may not cover all possible information. If you have questions about this medicine, talk to your doctor, pharmacist, or health care provider.  2018 Elsevier/Gold Standard (2015-12-27 18:42:26)  Prozac tapering Will reduce Prozac to 30 mg for 5 days and then reduce to 20 mg po daily for the next 5 days.And then discontinue Prozac after that . Please follow up with me in 1 weeks.
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
## 2017-05-26 ENCOUNTER — Encounter: Payer: Self-pay | Admitting: Psychiatry

## 2017-05-26 ENCOUNTER — Ambulatory Visit (INDEPENDENT_AMBULATORY_CARE_PROVIDER_SITE_OTHER): Payer: Medicare Other | Admitting: Psychiatry

## 2017-05-26 ENCOUNTER — Other Ambulatory Visit: Payer: Self-pay | Admitting: Psychiatry

## 2017-05-26 VITALS — BP 101/71 | HR 68 | Temp 98.6°F | Wt 165.6 lb

## 2017-05-26 DIAGNOSIS — Z8659 Personal history of other mental and behavioral disorders: Secondary | ICD-10-CM

## 2017-05-26 DIAGNOSIS — F331 Major depressive disorder, recurrent, moderate: Secondary | ICD-10-CM | POA: Diagnosis not present

## 2017-05-26 DIAGNOSIS — F411 Generalized anxiety disorder: Secondary | ICD-10-CM

## 2017-05-26 MED ORDER — HYDROXYZINE PAMOATE 25 MG PO CAPS: 25.0000 mg | ORAL_CAPSULE | Freq: Three times a day (TID) | ORAL | 1 refills | Status: DC | PRN

## 2017-05-26 MED ORDER — VENLAFAXINE HCL ER 75 MG PO CP24: 75.0000 mg | ORAL_CAPSULE | Freq: Every day | ORAL | 1 refills | Status: DC

## 2017-05-26 MED ORDER — FLUOXETINE HCL 10 MG PO CAPS: 10.0000 mg | ORAL_CAPSULE | Freq: Every day | ORAL | 0 refills | Status: DC
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
## 2017-05-26 NOTE — Telephone Encounter (Signed)
Called pharmacy regarding prescription verification. Verified .

## 2017-06-09 ENCOUNTER — Ambulatory Visit (INDEPENDENT_AMBULATORY_CARE_PROVIDER_SITE_OTHER): Payer: Medicare Other | Admitting: Psychiatry

## 2017-06-09 ENCOUNTER — Encounter: Payer: Self-pay | Admitting: Psychiatry

## 2017-06-09 ENCOUNTER — Telehealth: Payer: Self-pay | Admitting: Psychiatry

## 2017-06-09 DIAGNOSIS — F411 Generalized anxiety disorder: Secondary | ICD-10-CM

## 2017-06-09 DIAGNOSIS — F331 Major depressive disorder, recurrent, moderate: Secondary | ICD-10-CM | POA: Diagnosis not present

## 2017-06-09 MED ORDER — VENLAFAXINE HCL ER 75 MG PO CP24: 150.0000 mg | ORAL_CAPSULE | Freq: Every day | ORAL | 1 refills | Status: DC

## 2017-06-09 MED ORDER — ARIPIPRAZOLE 2 MG PO TABS: 2.0000 mg | ORAL_TABLET | Freq: Every day | ORAL | 1 refills | Status: DC

## 2017-06-09 NOTE — Progress Notes (Signed)
BH MD/PA/NP OP Progress Note  06/09/2017 2:53 PM HAN LYSNE  MRN:  [id number]  Chief Complaint: " I feel flat.'  Chief Complaint    Medication Problem     HPI: [name] [name] is a 36 year old Caucasian female who is divorced, lives in [place], is on SSD, has a history of depression, anxiety, ADD, GI issues (achalasia) , presented to the clinic today for a follow-up.  [name] [name] today reports that she was feeling better for a few days.  However she currently feels like she is more flat now.  She reports that she has low energy, feels sad, inability to focus and also has some anhedonia.  She reports that she has a lot to do for her kids all day and she feels lethargic.  She reports sleep is okay.  She denies any suicidal thoughts.  She reports that she was taking Effexor XR 75 mg.  She initially felt more positive.  Now she feels like she needs something more to help her feel better.  She tapered herself off of the Prozac and that went well.  She denies any side effects to Effexor.  She reports her appetite is fair.  She reports that currently her home situation is chaotic.  Her brother just moved in to stay with her after his girlfriend cheated on him.  She reports that he is supportive and helps out.  Her aunt continues to be supportive.  She continues to deal with her medical issues.  She  follows up with her providers on a regular basis.  Did not have her ADHD testing yet.  She is scheduled to have it done in November.   Visit Diagnosis:    ICD-10-CM   1. Generalized anxiety disorder F41.1 venlafaxine XR (EFFEXOR XR) 75 MG 24 hr capsule  2. Major depressive disorder, recurrent episode, moderate (HCC) F33.1 venlafaxine XR (EFFEXOR XR) 75 MG 24 hr capsule    ARIPiprazole (ABILIFY) 2 MG tablet    Past Psychiatric History: History of ADD, bipolar disorder( wrong diagnosis) , MDD, GAD.  History of IP admissions for mental illness in the past.  Denies suicide attempts.  Has tried medications  like Cymbalta, Celexa, Wellbutrin, Lamictal, Adderall, Ritalin  Past Medical History:  Past Medical History:  Diagnosis Date  . ADHD (attention deficit hyperactivity disorder)   . Anxiety   . Depression     Past Surgical History:  Procedure Laterality Date  . ABDOMINAL SURGERY    . CHOLECYSTECTOMY    . ESOPHAGECTOMY      Family Psychiatric History: Father, mother-anger issues, anxiety, depression.  Daughter-depression.  Family History:  Family History  Problem Relation Age of Onset  . Anxiety disorder Mother   . Anxiety disorder Father   . ADD / ADHD Brother   . Anxiety disorder Brother   . Anxiety disorder Maternal Grandmother     Social History: Born in [place], raised by parents.  Married and divorced twice to her high school sweetheart.  He is now in prison.  Has 2 daughters.  She is on Social Security disability Social History   Social History  . Marital status: Single    Spouse name: N/A  . Number of children: N/A  . Years of education: N/A   Social History Main Topics  . Smoking status: Never Smoker  . Smokeless tobacco: Never Used  . Alcohol use No  . Drug use: Yes    Types: Marijuana     Comment: last used 6 mths ago  . Sexual  activity: Not Currently   Other Topics Concern  . None   Social History Narrative  . None    Allergies:  Allergies  Allergen Reactions  . Fentanyl Hives  . Metoclopramide Other (See Comments)  . Morphine Hives  . Butalbital-Apap-Caffeine Other (See Comments)    Paranoia  . Diazepam Other (See Comments)  . Penicillins   . Promethazine     seizures  . Latex Rash  . Lorazepam Other (See Comments)    combative    Metabolic Disorder Labs: No results found for: HGBA1C, MPG No results found for: PROLACTIN No results found for: CHOL, TRIG, HDL, CHOLHDL, VLDL, LDLCALC Lab Results  Component Value Date   TSH 0.733 06/19/2010   TSH 0.875 09/27/2009    Therapeutic Level Labs: No results found for: LITHIUM No results  found for: VALPROATE No components found for:  CBMZ  Current Medications: Current Outpatient Prescriptions  Medication Sig Dispense Refill  . hydrOXYzine (VISTARIL) 25 MG capsule Take 1 capsule (25 mg total) by mouth 3 (three) times daily as needed for anxiety. 75 capsule 1  . ondansetron (ZOFRAN-ODT) 4 MG disintegrating tablet Take 8 mg by mouth.    . pantoprazole (PROTONIX) 20 MG tablet Take 40 mg by mouth.    . venlafaxine XR (EFFEXOR XR) 75 MG 24 hr capsule Take 2 capsules (150 mg total) by mouth daily with breakfast. 60 capsule 1  . ARIPiprazole (ABILIFY) 2 MG tablet Take 1 tablet (2 mg total) by mouth daily. 30 tablet 1   No current facility-administered medications for this visit.      Musculoskeletal: Strength & Muscle Tone: within normal limits Gait & Station: normal Patient leans: N/A  Psychiatric Specialty Exam: Review of Systems  Psychiatric/Behavioral: Positive for depression. The patient is nervous/anxious.   All other systems reviewed and are negative.   Blood pressure 107/73, pulse 89, temperature 98.5 F (36.9 C), temperature source Oral, weight 163 lb 3.2 oz (74 kg), last menstrual period 05/19/2017.Body mass index is 28.91 kg/m.  General Appearance: Casual  Eye Contact:  Fair  Speech:  Normal Rate  Volume:  Normal  Mood:  Anxious and Dysphoric  Affect:  Congruent  Thought Process:  Goal Directed and Descriptions of Associations: Intact  Orientation:  Full (Time, Place, and Person)  Thought Content: Rumination   Suicidal Thoughts:  No  Homicidal Thoughts:  No  Memory:  Immediate;   Fair Recent;   Fair Remote;   Fair  Judgement:  Fair  Insight:  Fair  Psychomotor Activity:  Normal  Concentration:  Concentration: Fair and Attention Span: Fair  Recall:  [employer]  Fund of Knowledge: Fair  Language: Fair  Akathisia:  No  Handed:  Right  AIMS (if indicated):NA  Assets:  Communication Skills Desire for Improvement  ADL's:  Intact  Cognition: WNL  Sleep:   Fair     Assessment and Plan: [name] CaldronShelli is a 36 year old Caucasian female who has a history of depression and anxiety, ADD as well as esophageal achalasia, presents today for a follow-up visit.  She reports some effect from her Effexor XR, however she feels like she needs some more help with her mood symptoms.  She continues to deny suicidal ideation, has good social support from family.  She continues to be a good candidate for outpatient treatment.  Plan: Anxiety Will increase Effexor XR to 150 mg p.o. daily with breakfast. Vistaril 25 mg p.o. 3 times daily as needed for anxiety symptoms.  Depression Increase Effexor XR to 150  mg p.o. daily with breakfast Start Abilify 2 mg p.o. nightly to augment the Effexor.  ADD Testing pending.  Referred for CBT.  Provided medication education provided handouts.  Discussed leisure activities, relaxation techniques, exercise.  We will up in 4 weeks or sooner if needed  More than 50 % of the time was spent for psychoeducation and supportive psychotherapy and care coordination.  This note was generated in part or whole with voice recognition software. Voice recognition is usually quite accurate but there are transcription errors that can and very often do occur. I apologize for any typographical errors that were not detected and corrected.       Jomarie Longs, MD 06/09/2017, 2:53 PM

## 2017-06-09 NOTE — Telephone Encounter (Signed)
Pt was seen today. And appt was made for her to come in.

## 2017-06-09 NOTE — Patient Instructions (Signed)
Aripiprazole tablets What is this medicine? ARIPIPRAZOLE (ay ri PIP ray zole) is an atypical antipsychotic. It is used to treat schizophrenia and bipolar disorder, also known as manic-depression. It is also used to treat Tourette's disorder and some symptoms of autism. This medicine may also be used in combination with antidepressants to treat major depressive disorder. This medicine may be used for other purposes; ask your health care provider or pharmacist if you have questions. COMMON BRAND NAME(S): Abilify What should I tell my health care provider before I take this medicine? They need to know if you have any of these conditions: -dehydration -dementia -diabetes -heart disease -history of stroke -low blood counts, like low white cell, platelet, or red cell counts -Parkinson's disease -seizures -suicidal thoughts, plans, or attempt; a previous suicide attempt by you or a family member -an unusual or allergic reaction to aripiprazole, other medicines, foods, dyes, or preservatives -pregnant or trying to get pregnant -breast-feeding How should I use this medicine? Take this medicine by mouth with a glass of water. Follow the directions on the prescription label. You can take this medicine with or without food. Take your doses at regular intervals. Do not take your medicine more often than directed. Do not stop taking except on the advice of your doctor or health care professional. A special MedGuide will be given to you by the pharmacist with each prescription and refill. Be sure to read this information carefully each time. Talk to your pediatrician regarding the use of this medicine in children. While this drug may be prescribed for children as young as 6 years of age for selected conditions, precautions do apply. Overdosage: If you think you have taken too much of this medicine contact a poison control center or emergency room at once. NOTE: This medicine is only for you. Do not share  this medicine with others. What if I miss a dose? If you miss a dose, take it as soon as you can. If it is almost time for your next dose, take only that dose. Do not take double or extra doses. What may interact with this medicine? Do not take this medicine with any of the following medications: -brexpiprazole -cisapride -dofetilide -dronedarone -metoclopramide -pimozide -thioridazine This medicine may also interact with the following medications: -alcohol -carbamazepine -certain medicines for anxiety or sleep -certain medicines for blood pressure -certain medicines for fungal infections like ketoconazole, fluconazole, posaconazole, and itraconazole -clarithromycin -fluoxetine -other medicines that prolong the QT interval (cause an abnormal heart rhythm) -paroxetine -quinidine -rifampin This list may not describe all possible interactions. Give your health care provider a list of all the medicines, herbs, non-prescription drugs, or dietary supplements you use. Also tell them if you smoke, drink alcohol, or use illegal drugs. Some items may interact with your medicine. What should I watch for while using this medicine? Visit your doctor or health care professional for regular checks on your progress. It may be several weeks before you see the full effects of this medicine. Do not suddenly stop taking this medicine. You may need to gradually reduce the dose. Patients and their families should watch out for worsening depression or thoughts of suicide. Also watch out for sudden changes in feelings such as feeling anxious, agitated, panicky, irritable, hostile, aggressive, impulsive, severely restless, overly excited and hyperactive, or not being able to sleep. If this happens, especially at the beginning of antidepressant treatment or after a change in dose, call your health care professional. You may get dizzy or drowsy. Do   not drive, use machinery, or do anything that needs mental  alertness until you know how this medicine affects you. Do not stand or sit up quickly, especially if you are an older patient. This reduces the risk of dizzy or fainting spells. Alcohol can increase dizziness and drowsiness. Avoid alcoholic drinks. This medicine can reduce the response of your body to heat or cold. Dress warm in cold weather and stay hydrated in hot weather. If possible, avoid extreme temperatures like saunas, hot tubs, very hot or cold showers, or activities that can cause dehydration such as vigorous exercise. This medicine may cause dry eyes and blurred vision. If you wear contact lenses you may feel some discomfort. Lubricating drops may help. See your eye doctor if the problem does not go away or is severe. If you notice an increased hunger or thirst, different from your normal hunger or thirst, or if you find that you have to urinate more frequently, you should contact your health care provider as soon as possible. You may need to have your blood sugar monitored. This medicine may cause changes in your blood sugar levels. You should monitor you blood sugar frequently if you have diabetes. There have been reports of uncontrollable and strong urges to gamble, binge eat, shop, and have sex while taking this medicine. If you experience any of these or other uncontrollable and strong urges while taking this medicine, you should report it to your health care provider as soon as possible. What side effects may I notice from receiving this medicine? Side effects that you should report to your doctor or health care professional as soon as possible: -allergic reactions like skin rash, itching or hives, swelling of the face, lips, or tongue -breathing problems -confusion -feeling faint or lightheaded, falls -fever or chills, sore throat -increased hunger or thirst -increased urination -joint pain -muscles pain, spasms -problems with balance, talking, walking -restlessness or need to  keep moving -seizures -suicidal thoughts or other mood changes -trouble swallowing -uncontrollable and excessive urges (examples: gambling, binge eating, shopping, having sex) -uncontrollable head, mouth, neck, arm, or leg movements -unusually weak or tired Side effects that usually do not require medical attention (report to your doctor or health care professional if they continue or are bothersome): -blurred vision -constipation -headache -nausea, vomiting -trouble sleeping -weight gain This list may not describe all possible side effects. Call your doctor for medical advice about side effects. You may report side effects to FDA at 1-800-FDA-1088. Where should I keep my medicine? Keep out of the reach of children. Store at room temperature between 15 and 30 degrees C (59 and 86 degrees F). Throw away any unused medicine after the expiration date. NOTE: This sheet is a summary. It may not cover all possible information. If you have questions about this medicine, talk to your doctor, pharmacist, or health care provider.  2018 Elsevier/Gold Standard (2016-07-13 11:45:05)  

## 2017-06-16 ENCOUNTER — Ambulatory Visit: Payer: Medicare Other | Admitting: Psychiatry

## 2017-07-24 ENCOUNTER — Telehealth: Payer: Self-pay

## 2017-07-24 ENCOUNTER — Other Ambulatory Visit (HOSPITAL_COMMUNITY): Payer: Self-pay

## 2017-07-24 DIAGNOSIS — F331 Major depressive disorder, recurrent, moderate: Secondary | ICD-10-CM

## 2017-07-24 MED ORDER — ARIPIPRAZOLE 2 MG PO TABS: 2.0000 mg | ORAL_TABLET | Freq: Every day | ORAL | 0 refills | Status: DC

## 2017-07-24 NOTE — Telephone Encounter (Signed)
pt called states she can not find her abilify bottle she has looked everywhere for it and can not find it .  she has not had it in 2 days.  pt was advised that dr. Elna Bresloweappen had already left for the day and that a message would be sent to the [place] location.

## 2017-07-24 NOTE — Telephone Encounter (Signed)
I called Dr. Elna BreslowEappen and told her what happened. She authorized a 30 day order to be sent to patients pharmacy. I sent the order and called patient three times, she did not answer nor was there a voicemail set up. She has a follow up Monday 07/27/2017.

## 2017-07-27 ENCOUNTER — Other Ambulatory Visit: Payer: Self-pay

## 2017-07-27 ENCOUNTER — Encounter: Payer: Self-pay | Admitting: Psychiatry

## 2017-07-27 ENCOUNTER — Ambulatory Visit (INDEPENDENT_AMBULATORY_CARE_PROVIDER_SITE_OTHER): Payer: Medicare Other | Admitting: Psychiatry

## 2017-07-27 VITALS — BP 119/76 | HR 75 | Temp 98.2°F | Wt 171.2 lb

## 2017-07-27 DIAGNOSIS — F331 Major depressive disorder, recurrent, moderate: Secondary | ICD-10-CM | POA: Diagnosis not present

## 2017-07-27 DIAGNOSIS — F900 Attention-deficit hyperactivity disorder, predominantly inattentive type: Secondary | ICD-10-CM

## 2017-07-27 DIAGNOSIS — F411 Generalized anxiety disorder: Secondary | ICD-10-CM

## 2017-07-27 MED ORDER — METHYLPHENIDATE HCL ER (LA) 40 MG PO CP24: 40.0000 mg | ORAL_CAPSULE | ORAL | 0 refills | Status: DC

## 2017-07-27 MED ORDER — ARIPIPRAZOLE 5 MG PO TABS: 5.0000 mg | ORAL_TABLET | Freq: Every day | ORAL | 0 refills | Status: DC

## 2017-07-27 MED ORDER — VENLAFAXINE HCL ER 75 MG PO CP24: 150.0000 mg | ORAL_CAPSULE | Freq: Every day | ORAL | 0 refills | Status: DC

## 2017-07-27 NOTE — Telephone Encounter (Signed)
Yes, she has an appointment today. Thanks.

## 2017-07-27 NOTE — Progress Notes (Signed)
BH MD  OP Progress Note  07/27/2017 12:12 PM [name] N [name]  MRN:  [id number]  Chief Complaint: ' I am ok.'  Chief Complaint    Follow-up; Medication Refill; Medication Problem     HPI: [name] [name] is a 36 year old Caucasian female, who is divorced, lives in [place], is on SSD, has a history of depression, anxiety, ADD, GI issues ( achalasia) , presented to the clinic today for a follow-up visit.  [name] today reports that she is feeling much better than before.  She feels like the Effexor and the Abilify combination has been effective.  She reports that she was taking a higher dose of Abilify  by mistake.  She was supposed to take 2 mg but she thought she was supposed to take 4, and she was taking it twice a day.  She feels like the Abilify actually has been effective and helped with her energy and her mood symptoms.  She continues to have.  When she feels tired, and also has anxiety.  She wants her medication to be readjusted today.  [name] [name] today reports that she is hoping to transition her ADHD medications to this clinic.  She reports that her primary medical doctor who has been prescribing  her ADHD medication will  send records over today.  Discussed with her that we will look forward to those records.  She reports she tried getting [facility] but they were not able to do it.  [facility] looks like [facility] closed.  Discussed with her that I will take a look at the ADHD records from [facility] and if there is a need for retesting we will refer her to another clinic.  She reports she was tested for ADHD when she was 36 years old and has been on medications on and off of ever since.  She reports sleep as poor on and off.  She reports that she woke up at 4 AM today.  She reports that she feels sleepy and tired right now.  She reports she used to be on medications like Ambien, trazodone in the past.  She reports she does not want to be on any sleep aids at this time.  She   wants to give it more time and see if the other medications that she is on now will also stabilize her sleep.  She denies abusing any drugs or alcohol.  She denies any new changes with her medical history.  She feels her social life has improved a little bit.  She has been spending more time to take care of herself.  She is looking forward to the holidays.   Visit Diagnosis:    ICD-10-CM   1. Attention deficit hyperactivity disorder (ADHD), predominantly inattentive type F90.0 methylphenidate (RITALIN LA) 40 MG 24 hr capsule  2. Generalized anxiety disorder F41.1 venlafaxine XR (EFFEXOR XR) 75 MG 24 hr capsule    ARIPiprazole (ABILIFY) 5 MG tablet  3. Major depressive disorder, recurrent episode, moderate (HCC) F33.1 venlafaxine XR (EFFEXOR XR) 75 MG 24 hr capsule    ARIPiprazole (ABILIFY) 5 MG tablet    Past Psychiatric History: Hx of  ADD, bipolar disorder, MDD, GAD.  History of IP admissions for mental illness in the past.  Denies suicide attempts.  Has tried medications like Cymbalta, Celexa, Wellbutrin, Lamictal, Adderall, Ritalin.  Past Medical History:  Past Medical History:  Diagnosis Date  . ADHD (attention deficit hyperactivity disorder)   . Anxiety   . Depression  Past Surgical History:  Procedure Laterality Date  . ABDOMINAL SURGERY    . CHOLECYSTECTOMY    . ESOPHAGECTOMY      Family Psychiatric History: Father , mother - anger issues, anxiety, depression. Daughter - depression.  Family History:  Family History  Problem Relation Age of Onset  . Anxiety disorder Mother   . Anxiety disorder Father   . ADD / ADHD Brother   . Anxiety disorder Brother   . Anxiety disorder Maternal Grandmother    Substance abuse history Denies   Social History: Born in [place], raised by parents.  Married and divorced twice to her high school sweetheart.  He is now in prison.  Has 2 daughters.  She is on Social Security disability.  She has good social support from her aunt. Social  History   Socioeconomic History  . Marital status: Single    Spouse name: None  . Number of children: None  . Years of education: None  . Highest education level: None  Social Needs  . Financial resource strain: None  . Food insecurity - worry: None  . Food insecurity - inability: None  . Transportation needs - medical: None  . Transportation needs - non-medical: None  Occupational History  . None  Tobacco Use  . Smoking status: Never Smoker  . Smokeless tobacco: Never Used  Substance and Sexual Activity  . Alcohol use: No  . Drug use: Yes    Types: Marijuana    Comment: last used 6 mths ago  . Sexual activity: Not Currently  Other Topics Concern  . None  Social History Narrative  . None    Allergies:  Allergies  Allergen Reactions  . Fentanyl Hives  . Metoclopramide Other (See Comments)  . Morphine Hives  . Butalbital-Apap-Caffeine Other (See Comments)    Paranoia  . Diazepam Other (See Comments)  . Penicillins   . Promethazine     seizures  . Latex Rash  . Lorazepam Other (See Comments)    combative    Metabolic Disorder Labs: No results found for: HGBA1C, MPG No results found for: PROLACTIN No results found for: CHOL, TRIG, HDL, CHOLHDL, VLDL, LDLCALC Lab Results  Component Value Date   TSH 0.733 06/19/2010   TSH 0.875 09/27/2009    Therapeutic Level Labs: No results found for: LITHIUM No results found for: VALPROATE No components found for:  CBMZ  Current Medications: Current Outpatient Medications  Medication Sig Dispense Refill  . hydrOXYzine (VISTARIL) 25 MG capsule Take 1 capsule (25 mg total) by mouth 3 (three) times daily as needed for anxiety. 75 capsule 1  . ondansetron (ZOFRAN-ODT) 4 MG disintegrating tablet Take 8 mg by mouth.    . pantoprazole (PROTONIX) 20 MG tablet Take 40 mg by mouth.    . venlafaxine XR (EFFEXOR XR) 75 MG 24 hr capsule Take 2 capsules (150 mg total) by mouth daily with breakfast. 180 capsule 0  . ARIPiprazole  (ABILIFY) 5 MG tablet Take 1 tablet (5 mg total) by mouth daily. 90 tablet 0  . methylphenidate (RITALIN LA) 40 MG 24 hr capsule Take 1 capsule (40 mg total) by mouth every morning. 30 capsule 0   No current facility-administered medications for this visit.      Musculoskeletal: Strength & Muscle Tone: within normal limits Gait & Station: normal Patient leans: N/A  Psychiatric Specialty Exam: Review of Systems  Psychiatric/Behavioral: Positive for depression (improving). The patient is nervous/anxious and has insomnia (on and off).   All other systems  reviewed and are negative.   Blood pressure 119/76, pulse 75, temperature 98.2 F (36.8 C), temperature source Oral, weight 171 lb 3.2 oz (77.7 kg), last menstrual period 07/27/2017.Body mass index is 30.33 kg/m.  General Appearance: Casual  Eye Contact:  Fair  Speech:  Clear and Coherent  Volume:  Normal  Mood:  Anxious  Affect:  Congruent  Thought Process:  Goal Directed and Descriptions of Associations: Intact  Orientation:  Full (Time, Place, and Person)  Thought Content: Logical   Suicidal Thoughts:  No  Homicidal Thoughts:  No  Memory:  Immediate;   Fair Recent;   Fair Remote;   Fair  Judgement:  Fair  Insight:  Fair  Psychomotor Activity:  Normal  Concentration:  Concentration: Fair and Attention Span: Fair  Recall:  [employer] of Knowledge: Fair  Language: Fair  Akathisia:  No  Handed:  Right  AIMS (if indicated):0  Assets:  Communication Skills Desire for Improvement Social Support  ADL's:  Intact  Cognition: WNL  Sleep:  poor at times   Screenings:   Assessment and Plan: Karn is a 36 year old Caucasian female who has a history of depression and anxiety, ADD as well as esophageal achalasia, presented today for a follow-up visit.  She will he reports the medications as helping her manage her anxiety and mood symptoms.  She continues to have some sleep issues, fatigue on and off.  She however feels the  medications are overall effective.  She would also like to transition her ADD care to this clinic.  She has called her PMD and have asked them to forward medical records to [place].  Will make medication changes as noted below.  Plan Anxiety Will continue Effexor XR 150 mg p.o. daily. Vistaril 25 mg p.o. 3 times daily as needed for anxiety symptoms.  Depression Effexor XR 150 mg p.o. daily. Increase Abilify to 5 mg p.o. Daily. AIMS = 0  For ADD Patient has requested her PMD to forward her medical records to [place]. She reports she was diagnosed with ADD when she was 36 years old. Reviewed [place] controlled substance database. Will give her a new prescription for Ritalin LA which she has been taking, 40 mg p.o. Daily.  For insomnia She does not want to be on any sleep aid at this time, we will continue to monitor.  Provided medication education, provided handouts.  She will follow-up with me in clinic in 4 weeks or sooner if needed.    This note was generated in part or whole with voice recognition software. Voice recognition is usually quite accurate but there are transcription errors that can and very often do occur. I apologize for any typographical errors that were not detected and corrected.      Jomarie Longs, MD 07/27/2017, 12:12 PM

## 2017-08-26 ENCOUNTER — Other Ambulatory Visit: Payer: Self-pay

## 2017-08-26 ENCOUNTER — Encounter: Payer: Self-pay | Admitting: Psychiatry

## 2017-08-26 ENCOUNTER — Ambulatory Visit (INDEPENDENT_AMBULATORY_CARE_PROVIDER_SITE_OTHER): Payer: Medicare Other | Admitting: Psychiatry

## 2017-08-26 VITALS — BP 108/49 | HR 83 | Temp 98.9°F | Wt 181.0 lb

## 2017-08-26 DIAGNOSIS — F331 Major depressive disorder, recurrent, moderate: Secondary | ICD-10-CM | POA: Diagnosis not present

## 2017-08-26 DIAGNOSIS — F5105 Insomnia due to other mental disorder: Secondary | ICD-10-CM | POA: Diagnosis not present

## 2017-08-26 DIAGNOSIS — F900 Attention-deficit hyperactivity disorder, predominantly inattentive type: Secondary | ICD-10-CM | POA: Diagnosis not present

## 2017-08-26 DIAGNOSIS — F411 Generalized anxiety disorder: Secondary | ICD-10-CM | POA: Diagnosis not present

## 2017-08-26 MED ORDER — DOXEPIN HCL 10 MG PO CAPS: 10.0000 mg | ORAL_CAPSULE | Freq: Every evening | ORAL | 1 refills | Status: DC | PRN

## 2017-08-26 MED ORDER — METHYLPHENIDATE HCL ER (LA) 40 MG PO CP24: 40.0000 mg | ORAL_CAPSULE | ORAL | 0 refills | Status: DC

## 2017-08-26 NOTE — Progress Notes (Signed)
BH MD OP Progress Note  08/26/2017 4:43 PM [name] [name]  MRN:  [id number]  Chief Complaint: ' I am still anxious."  Chief Complaint    Follow-up; Medication Refill; Weight Gain     HPI: [name] [name] is a 37 year old Caucasian female, divorced, lives in [place], is on SSD, has a history of depression, anxiety, ADD, GI issues achalasia, presented to the clinic today for a follow-up visit.  [name] [name] presented late for her appointment today.  [name] [name] today reports she feels better than before but she continues to have some anxiety issues.  She reports several psychosocial stressors.  Her ex-husband who is the father of all her children is currently out of prison.  He is currently on probation.  She reports he tried to contact her several times.  She also reports there is a CPS case pending on her since her 43 year old daughter missed a lot of classes due to her mental health issues.  She reports her daughter may need to be home schooled.  She reports she is currently working on all that and that is kind of anxiety provoking.  She reports sleep continues to be stressful.  She reports she continues to wake up at 3 AM or so.  She previously did not want to be on a sleep aid.  She had a bad response to trazodone which gave her nightmares as well as Ambien which made her confused.  Discuss other sleep aids with her.  She did not want the Remeron because of weight gain issues as well as increased appetite.  She reports she is okay with trying the doxepin.  She reports she is taking the Ritalin as prescribed.  She reports her attention as improved but she may be having some attention and concentration problems.  She reports she sometimes does catch herself zoning out and unable to focus.  She wonders whether her dose can be changed or readjusted at this time.  Discussed with her that she is already on a high dose of Ritalin.  Also discussed with her that the morning dose can be reduced and an immediate release can  be added.  She reports she wants to stay on the same dose for now and then discuss this again at the end of this month.  She reports she joined ' Reiki" classes.  She reports she enjoys it.  She plans to continue it since she enjoys it.  Celebrated her birthday a few weeks ago.  She reports she had a very peaceful day and took time to rest and watch her favorite show.  Her aunt continues to be supportive. Visit Diagnosis:    ICD-10-CM   1. MDD (major depressive disorder), recurrent episode, moderate (HCC) F33.1 doxepin (SINEQUAN) 10 MG capsule  2. Attention deficit hyperactivity disorder (ADHD), predominantly inattentive type F90.0 methylphenidate (RITALIN LA) 40 MG 24 hr capsule  3. GAD (generalized anxiety disorder) F41.1 doxepin (SINEQUAN) 10 MG capsule  4. Insomnia due to mental disorder F51.05 doxepin (SINEQUAN) 10 MG capsule    Past Psychiatric History:Hx of ADD, bipolar disorder, MDD, GAD.  History of IP admissions for mental illness in the past.  Denies suicide attempts.  Has tried medications like Cymbalta, Celexa, Wellbutrin, Lamictal, Adderall, Ritalin  Past Medical History:  Past Medical History:  Diagnosis Date  . ADHD (attention deficit hyperactivity disorder)   . Anxiety   . Depression     Past Surgical History:  Procedure Laterality Date  . ABDOMINAL SURGERY    . CHOLECYSTECTOMY    .  ESOPHAGECTOMY      Family Psychiatric History: Father, mother-anger issues, fatigue, depression.  Daughter-depression  Family History:  Family History  Problem Relation Age of Onset  . Anxiety disorder Mother   . Anxiety disorder Father   . ADD / ADHD Brother   . Anxiety disorder Brother   . Anxiety disorder Maternal Grandmother    Substance abuse history: Denies   Social History: Born in [place], raised by parents.  Married and divorced twice to her high school sweetheart.  He is currently released from prison.  She has 2 daughters.  She is on Social Security disability.  She has  good social support from her aunt. Social History   Socioeconomic History  . Marital status: Single    Spouse name: None  . Number of children: None  . Years of education: None  . Highest education level: None  Social Needs  . Financial resource strain: None  . Food insecurity - worry: None  . Food insecurity - inability: None  . Transportation needs - medical: None  . Transportation needs - non-medical: None  Occupational History  . None  Tobacco Use  . Smoking status: Never Smoker  . Smokeless tobacco: Never Used  Substance and Sexual Activity  . Alcohol use: No  . Drug use: Yes    Types: Marijuana    Comment: last used 6 mths ago  . Sexual activity: Not Currently  Other Topics Concern  . None  Social History Narrative  . None    Allergies:  Allergies  Allergen Reactions  . Fentanyl Hives  . Metoclopramide Other (See Comments)  . Morphine Hives  . Butalbital-Apap-Caffeine Other (See Comments)    Paranoia  . Diazepam Other (See Comments)  . Penicillins   . Promethazine     seizures  . Latex Rash  . Lorazepam Other (See Comments)    combative    Metabolic Disorder Labs: No results found for: HGBA1C, MPG No results found for: PROLACTIN No results found for: CHOL, TRIG, HDL, CHOLHDL, VLDL, LDLCALC Lab Results  Component Value Date   TSH 0.733 06/19/2010   TSH 0.875 09/27/2009    Therapeutic Level Labs: No results found for: LITHIUM No results found for: VALPROATE No components found for:  CBMZ  Current Medications: Current Outpatient Medications  Medication Sig Dispense Refill  . ARIPiprazole (ABILIFY) 5 MG tablet Take 1 tablet (5 mg total) by mouth daily. 90 tablet 0  . hydrOXYzine (VISTARIL) 25 MG capsule Take 1 capsule (25 mg total) by mouth 3 (three) times daily as needed for anxiety. 75 capsule 1  . methylphenidate (RITALIN LA) 40 MG 24 hr capsule Take 1 capsule (40 mg total) by mouth every morning. 30 capsule 0  . ondansetron (ZOFRAN-ODT) 4 MG  disintegrating tablet Take 8 mg by mouth.    . pantoprazole (PROTONIX) 20 MG tablet Take 40 mg by mouth.    . venlafaxine XR (EFFEXOR XR) 75 MG 24 hr capsule Take 2 capsules (150 mg total) by mouth daily with breakfast. 180 capsule 0  . doxepin (SINEQUAN) 10 MG capsule Take 1-2 capsules (10-20 mg total) by mouth at bedtime as needed. 60 capsule 1   No current facility-administered medications for this visit.      Musculoskeletal: Strength & Muscle Tone: within normal limits Gait & Station: normal Patient leans: N/A  Psychiatric Specialty Exam: Review of Systems  Psychiatric/Behavioral: The patient is nervous/anxious and has insomnia.   All other systems reviewed and are negative.   Blood  pressure (!) 108/49, pulse 83, temperature 98.9 F (37.2 C), temperature source Oral, weight 181 lb (82.1 kg), last menstrual period 07/27/2017.Body mass index is 32.06 kg/m.  General Appearance: Casual  Eye Contact:  Fair  Speech:  Normal Rate  Volume:  Normal  Mood:  Anxious  Affect:  Congruent  Thought Process:  Goal Directed and Descriptions of Associations: Intact  Orientation:  Full (Time, Place, and Person)  Thought Content: Logical   Suicidal Thoughts:  No  Homicidal Thoughts:  No  Memory:  Immediate;   Fair Recent;   Fair Remote;   Fair  Judgement:  Fair  Insight:  Fair  Psychomotor Activity:  Normal  Concentration:  Concentration: Fair and Attention Span: Fair  Recall:  [employer] of Knowledge: Fair  Language: Fair  Akathisia:  No  Handed:  Right  AIMS (if indicated): 0  Assets:  Communication Skills Desire for Improvement Housing Social Support Talents/Skills Transportation Vocational/Educational  ADL's:  Intact  Cognition: WNL  Sleep:  restless   Screenings:   Assessment and Plan: [name] [name] is a 37 year old Caucasian female, who has a history of depression, anxiety, ADD as well as esophageal achalasia, presented today for a follow-up visit.  [name] [name] continues to  have psychosocial stressors.  Her ex-husband who is also the father of her children recently was released from prison.  There is also a CPS case against her since her daughter missed a lot of classes this year due to her mental health issues.  She has been compliant on her medications.  She denies side effects.  She continues to struggle with anxiety and sleep issues.  She is open to medication readjustments as well as motivated to pursue psychotherapy.  Plan as noted below.  Plan Anxiety Continue Effexor XR 150 mg p.o. daily Continue hydroxyzine 25 mg p.o. 3 times daily as needed.  She reports she did not take the hydroxyzine much.  Discussed with her to make use of it if her anxiety is worse.  For depression Effexor XR 150 mg p.o. daily Abilify 5 mg p.o. daily Aims equals 0  For ADD Continue Ritalin LA 40 mg p.o. daily Reviewed [place] controlled substance database   For insomnia Start doxepin 10-20 mg p.o. nightly as needed   Provided medication education  Provided supportive psychotherapy for 10 minutes.  Follow-up in 4 weeks or sooner if needed.  More than 50 % of the time was spent for psychoeducation and supportive psychotherapy and care coordination.   This note was generated in part or whole with voice recognition software. Voice recognition is usually quite accurate but there are transcription errors that can and very often do occur. I apologize for any typographical errors that were not detected and corrected.       Jomarie Longs, MD 08/26/2017, 4:43 PM

## 2017-08-26 NOTE — Patient Instructions (Signed)

## 2017-08-27 ENCOUNTER — Telehealth: Payer: Self-pay

## 2017-08-27 ENCOUNTER — Other Ambulatory Visit: Payer: Self-pay | Admitting: Psychiatry

## 2017-08-27 DIAGNOSIS — F900 Attention-deficit hyperactivity disorder, predominantly inattentive type: Secondary | ICD-10-CM

## 2017-08-27 MED ORDER — METHYLPHENIDATE HCL ER (LA) 40 MG PO CP24: 40.0000 mg | ORAL_CAPSULE | ORAL | 0 refills | Status: DC

## 2017-08-27 NOTE — Telephone Encounter (Signed)
I have sent Ritalin to [facility] on [address]    Please call [facility] to cancel the previous prescription for Ritalin.  Please also let patient know to pick it up from [facility].

## 2017-08-27 NOTE — Telephone Encounter (Signed)
Patient called to let know that [facility] did not have her Ritalin as prescribed.  Patient wants [facility] to [facility] [address]  We will send the prescription to [address] for Ritalin.  Will let Shanda BumpsJessica CMA to call [facility] to cancel the previous prescription for Ritalin.

## 2017-08-27 NOTE — Telephone Encounter (Signed)
pt called states that [facility] did not have ritalin 40mg  and will have to order and that they told her they would not get in until monday. pt states that she has a phone conference with her daughter teacher tomorrow and she needs to be focus. wants to know [facility] [address] [facility]. That they have the 40 mg there.   [facility] will not transfer rx control

## 2017-08-28 NOTE — Telephone Encounter (Signed)
called and let pharmacy know that rx for ritalin needed to be cancel that pt gat at another location. so they canceled the order.

## 2017-08-28 NOTE — Telephone Encounter (Signed)
Called and let [facility] know to cancel rx for ritalin that the [facility]

## 2017-09-07 ENCOUNTER — Telehealth: Payer: Self-pay

## 2017-09-07 ENCOUNTER — Other Ambulatory Visit: Payer: Self-pay | Admitting: Psychiatry

## 2017-09-07 DIAGNOSIS — F411 Generalized anxiety disorder: Secondary | ICD-10-CM

## 2017-09-07 DIAGNOSIS — F331 Major depressive disorder, recurrent, moderate: Secondary | ICD-10-CM

## 2017-09-07 MED ORDER — ARIPIPRAZOLE 5 MG PO TABS: 5.0000 mg | ORAL_TABLET | Freq: Two times a day (BID) | ORAL | 1 refills | Status: DC

## 2017-09-07 NOTE — Telephone Encounter (Signed)
pt has her daughter here to see dr. Daleen Boravi. pt states she needs to increase her effexor or abilify she doesn't feel any different it like she at a [employer].

## 2017-09-07 NOTE — Telephone Encounter (Signed)
If she is here , I can see her for a few minutes.

## 2017-09-07 NOTE — Telephone Encounter (Signed)
Dr. Elna BreslowEappen seen patient

## 2017-09-07 NOTE — Telephone Encounter (Signed)
Patient reports stressors of having CPS issue with her daughter , due to her missing school. Pt reports she needs some help with her medications. Discussed increasing Abilify to 5 mg po bid. She agrees with plan. [facility]

## 2017-09-10 ENCOUNTER — Ambulatory Visit: Payer: Self-pay | Admitting: Licensed Clinical Social Worker

## 2017-09-25 ENCOUNTER — Telehealth: Payer: Self-pay

## 2017-09-25 ENCOUNTER — Ambulatory Visit: Payer: Medicare Other | Admitting: Psychiatry

## 2017-09-25 DIAGNOSIS — F900 Attention-deficit hyperactivity disorder, predominantly inattentive type: Secondary | ICD-10-CM

## 2017-09-25 MED ORDER — METHYLPHENIDATE HCL ER (LA) 40 MG PO CP24
40.0000 mg | ORAL_CAPSULE | ORAL | 0 refills | Status: DC
Start: 2017-09-25 — End: 2017-09-30

## 2017-09-25 NOTE — Telephone Encounter (Signed)
Patient is sick was unable to make appointment today.  Hence called patient since she was requesting refills.  She reports she would like her Ritalin to be changed to another medication if possible.  Discussed with her that I will give her Ritalin for 7 days.  She will see me in clinic and will make further medication changes at that time.  Discussed with her that since she is sick , I do not want to make a medication changes at this time.

## 2017-09-25 NOTE — Telephone Encounter (Signed)
pt called states that she will not be able to make it today she and her daughter have a fever .  she is [birth year]. needs enough medication to do [facility]t [facility] [address]

## 2017-09-30 ENCOUNTER — Encounter: Payer: Self-pay | Admitting: Psychiatry

## 2017-09-30 ENCOUNTER — Other Ambulatory Visit: Payer: Self-pay

## 2017-09-30 ENCOUNTER — Ambulatory Visit (INDEPENDENT_AMBULATORY_CARE_PROVIDER_SITE_OTHER): Payer: Medicare Other | Admitting: Psychiatry

## 2017-09-30 VITALS — BP 105/67 | HR 83 | Temp 97.8°F | Wt 187.0 lb

## 2017-09-30 DIAGNOSIS — F411 Generalized anxiety disorder: Secondary | ICD-10-CM

## 2017-09-30 DIAGNOSIS — F331 Major depressive disorder, recurrent, moderate: Secondary | ICD-10-CM

## 2017-09-30 DIAGNOSIS — F902 Attention-deficit hyperactivity disorder, combined type: Secondary | ICD-10-CM | POA: Diagnosis not present

## 2017-09-30 DIAGNOSIS — F5105 Insomnia due to other mental disorder: Secondary | ICD-10-CM | POA: Diagnosis not present

## 2017-09-30 MED ORDER — LISDEXAMFETAMINE DIMESYLATE 20 MG PO CAPS: 20.0000 mg | ORAL_CAPSULE | Freq: Every day | ORAL | 0 refills | Status: DC

## 2017-09-30 NOTE — Patient Instructions (Signed)
Lisdexamfetamine Oral Capsule What is this medicine? LISDEXAMFETAMINE (lis DEX am fet a meen) is used to treat attention-deficit hyperactivity disorder (ADHD) in adults and children. It is also used to treat binge-eating disorder in adults. Federal law prohibits giving this medicine to any person other than the person for whom it was prescribed. Do not share this medicine with anyone else. This medicine may be used for other purposes; ask your health care provider or pharmacist if you have questions. COMMON BRAND NAME(S): Vyvanse What should I tell my health care provider before I take this medicine? They need to know if you have any of these conditions: -anxiety or panic attacks -circulation problems in fingers and toes -glaucoma -hardening or blockages of the arteries or heart blood vessels -heart disease or a heart defect -high blood pressure -history of a drug or alcohol abuse problem -history of stroke -kidney disease -liver disease -mental illness -seizures -suicidal thoughts, plans, or attempt; a previous suicide attempt by you or a family member -thyroid disease -Tourette's syndrome -an unusual or allergic reaction to lisdexamfetamine, other medicines, foods, dyes, or preservatives -pregnant or trying to get pregnant -breast-feeding How should I use this medicine? Take this medicine by mouth. Follow the directions on the prescription label. Swallow the capsules with a drink of water. You may open capsule and add to a glass of water, then drink right away. Take your doses at regular intervals. Do not take your medicine more often than directed. Do not suddenly stop your medicine. You must gradually reduce the dose or you may feel withdrawal effects. Ask your doctor or health care professional for advice. A special MedGuide will be given to you by the pharmacist with each prescription and refill. Be sure to read this information carefully each time. Talk to your pediatrician  regarding the use of this medicine in children. While this drug may be prescribed for children as young as 6 years of age for selected conditions, precautions do apply. Overdosage: If you think you have taken too much of this medicine contact a poison control center or emergency room at once. NOTE: This medicine is only for you. Do not share this medicine with others. What if I miss a dose? If you miss a dose, take it as soon as you can. If it is almost time for your next dose, take only that dose. Do not take double or extra doses. What may interact with this medicine? Do not take this medicine with any of the following medications: -MAOIs like Carbex, Eldepryl, Marplan, Nardil, and Parnate -other stimulant medicines for attention disorders, weight loss, or to stay awake This medicine may also interact with the following medications: -acetazolamide -ammonium chloride -antacids -ascorbic acid -atomoxetine -caffeine -certain medicines for blood pressure -certain medicines for depression, anxiety, or psychotic disturbances -certain medicines for seizures like carbamazepine, phenobarbital, phenytoin -certain medicines for stomach problems like cimetidine, famotidine, omeprazole, lansoprazole -cold or allergy medicines -green tea -levodopa -linezolid -medicines for sleep during surgery -methenamine -norepinephrine -phenothiazines like chlorpromazine, mesoridazine, prochlorperazine, thioridazine -propoxyphene -sodium acid phosphate -sodium bicarbonate This list may not describe all possible interactions. Give your health care provider a list of all the medicines, herbs, non-prescription drugs, or dietary supplements you use. Also tell them if you smoke, drink alcohol, or use illegal drugs. Some items may interact with your medicine. What should I watch for while using this medicine? Visit your doctor for regular check ups. This prescription requires that you follow special procedures with  your doctor and pharmacy.   You will need to have a new written prescription from your doctor every time you need a refill. This medicine may affect your concentration, or hide signs of tiredness. Until you know how this medicine affects you, do not drive, ride a bicycle, use machinery, or do anything that needs mental alertness. Tell your doctor or health care professional if this medicine loses its effects, or if you feel you need to take more than the prescribed amount. Do not change your dose without talking to your doctor or health care professional. Decreased appetite is a common side effect when starting this medicine. Eating small, frequent meals or snacks can help. Talk to your doctor if you continue to have poor eating habits. Height and weight growth of a child taking this medicine will be monitored closely. Do not take this medicine close to bedtime. It may prevent you from sleeping. If you are going to need surgery, a MRI, CT scan, or other procedure, tell your doctor that you are taking this medicine. You may need to stop taking this medicine before the procedure. Tell your doctor or healthcare professional right away if you notice unexplained wounds on your fingers and toes while taking this medicine. You should also tell your healthcare provider if you experience numbness or pain, changes in the skin color, or sensitivity to temperature in your fingers or toes. What side effects may I notice from receiving this medicine? Side effects that you should report to your doctor or health care professional as soon as possible: -allergic reactions like skin rash, itching or hives, swelling of the face, lips, or tongue -changes in vision -chest pain or chest tightness -confusion, trouble speaking or understanding -fast, irregular heartbeat -fingers or toes feel numb, cool, painful -hallucination, loss of contact with reality -high blood pressure -males: prolonged or painful  erection -seizures -severe headaches -shortness of breath -suicidal thoughts or other mood changes -trouble walking, dizziness, loss of balance or coordination -uncontrollable head, mouth, neck, arm, or leg movements Side effects that usually do not require medical attention (report to your doctor or health care professional if they continue or are bothersome): -anxious -headache -loss of appetite -nausea, vomiting -trouble sleeping -weight loss This list may not describe all possible side effects. Call your doctor for medical advice about side effects. You may report side effects to FDA at 1-800-FDA-1088. Where should I keep my medicine? Keep out of the reach of children. This medicine can be abused. Keep your medicine in a safe place to protect it from theft. Do not share this medicine with anyone. Selling or giving away this medicine is dangerous and against the law. Store at room temperature between 15 and 30 degrees C (59 and 86 degrees F). Protect from light. Keep container tightly closed. Throw away any unused medicine after the expiration date. NOTE: This sheet is a summary. It may not cover all possible information. If you have questions about this medicine, talk to your doctor, pharmacist, or health care provider.  2018 Elsevier/Gold Standard (2014-05-31 19:20:14)  

## 2017-09-30 NOTE — Progress Notes (Signed)
[facility] MD OP Progress Note  09/30/2017 1:01 PM [name] [name]  MRN:  [id number]  Chief Complaint: ' I am having some trouble with my Ritalin.'  Chief Complaint    Follow-up; Medication Refill; Weight Gain     HPI: [name] [name] is a caucasian female, divorced, lives in [place], is on SSD, has a history of depression, anxiety, ADD, GI issues like achalasia, presented to the clinic today for a follow-up visit.  [name] [name] today reports that she is concerned about her weight gain issues.  She reports she gained at least 5 pounds in the last 1 month.  She wonders whether her Abilify has been contributing to it.  She reports she has not been eating a lot and has been exercising.  She however reports she likes the Abilify because it keeps her mood symptoms under control and gives her the motivation.  She also reports she struggles with attention and concentration and feels like the Ritalin is ineffective at this time.  She reports the Ritalin used to work in the past but not anymore.  She wonders whether she can be started on the new medication which was discussed in the past call Vyvanse.  She reports sleep is okay.  She is compliant on her sleep aid.  She denies any side effects to the same.  She continues to have psychosocial stressors from her children who has mental health issues, her ex-husband who is currently released from prison, CPS involvement and so on.  She however reports the CPS case is soon going to be closed and she and her children are going to start intensive in-home therapy. Visit Diagnosis:    ICD-10-CM   1. Attention deficit hyperactivity disorder (ADHD), combined type F90.2 lisdexamfetamine (VYVANSE) 20 MG capsule  2. MDD (major depressive disorder), recurrent episode, moderate (HCC) F33.1   3. GAD (generalized anxiety disorder) F41.1   4. Insomnia due to mental condition F51.05     Past Psychiatric History: Hx of ADD, bipolar disorder, MDD, GAD.  History of IP admission for mental  illness in the past.  Denies suicide attempts.  Has tried medications like Cymbalta, Celexa, Wellbutrin, Lamictal, Adderall, Ritalin.  Past Medical History:  Past Medical History:  Diagnosis Date  . ADHD (attention deficit hyperactivity disorder)   . Anxiety   . Depression     Past Surgical History:  Procedure Laterality Date  . ABDOMINAL SURGERY    . CHOLECYSTECTOMY    . ESOPHAGECTOMY      Family Psychiatric History: Father, mother-anger issues, fatigue, depression.  Daughter-depression.  Family History:  Family History  Problem Relation Age of Onset  . Anxiety disorder Mother   . Anxiety disorder Father   . ADD / ADHD Brother   . Anxiety disorder Brother   . Anxiety disorder Maternal Grandmother    Substance abuse history: Denies  Social History: Born In [place], raised by parents.  Married and divorced twice to her high school sweetheart.  He is currently released from prison.  She has 2 daughters.  She is on SSD.  She has good social support from her aunt. Social History   Socioeconomic History  . Marital status: Single    Spouse name: None  . Number of children: None  . Years of education: None  . Highest education level: None  Social Needs  . Financial resource strain: None  . Food insecurity - worry: None  . Food insecurity - inability: None  . Transportation needs - medical: None  . Transportation needs -  non-medical: None  Occupational History  . None  Tobacco Use  . Smoking status: Never Smoker  . Smokeless tobacco: Never Used  Substance and Sexual Activity  . Alcohol use: No  . Drug use: Yes    Types: Marijuana    Comment: last used 6 mths ago  . Sexual activity: Not Currently  Other Topics Concern  . None  Social History Narrative  . None    Allergies:  Allergies  Allergen Reactions  . Fentanyl Hives  . Metoclopramide Other (See Comments)  . Morphine Hives  . Butalbital-Apap-Caffeine Other (See Comments)    Paranoia  . Diazepam Other (See  Comments)  . Penicillins   . Promethazine     seizures  . Latex Rash  . Lorazepam Other (See Comments)    combative    Metabolic Disorder Labs: No results found for: HGBA1C, MPG No results found for: PROLACTIN No results found for: CHOL, TRIG, HDL, CHOLHDL, VLDL, LDLCALC Lab Results  Component Value Date   TSH 0.733 06/19/2010   TSH 0.875 09/27/2009    Therapeutic Level Labs: No results found for: LITHIUM No results found for: VALPROATE No components found for:  CBMZ  Current Medications: Current Outpatient Medications  Medication Sig Dispense Refill  . ARIPiprazole (ABILIFY) 5 MG tablet Take 1 tablet (5 mg total) by mouth 2 (two) times daily. 60 tablet 1  . doxepin (SINEQUAN) 10 MG capsule Take 1-2 capsules (10-20 mg total) by mouth at bedtime as needed. 60 capsule 1  . hydrOXYzine (VISTARIL) 25 MG capsule Take 1 capsule (25 mg total) by mouth 3 (three) times daily as needed for anxiety. 75 capsule 1  . ondansetron (ZOFRAN-ODT) 4 MG disintegrating tablet Take 8 mg by mouth.    . pantoprazole (PROTONIX) 20 MG tablet Take 40 mg by mouth.    . venlafaxine XR (EFFEXOR XR) 75 MG 24 hr capsule Take 2 capsules (150 mg total) by mouth daily with breakfast. 180 capsule 0  . lisdexamfetamine (VYVANSE) 20 MG capsule Take 1 capsule (20 mg total) by mouth daily. 15 capsule 0   No current facility-administered medications for this visit.      Musculoskeletal: Strength & Muscle Tone: within normal limits Gait & Station: normal Patient leans: N/A  Psychiatric Specialty Exam: Review of Systems  Psychiatric/Behavioral: Positive for depression.  All other systems reviewed and are negative.   Blood pressure 105/67, pulse 83, temperature 97.8 F (36.6 C), temperature source Oral, weight 187 lb (84.8 kg).Body mass index is 33.13 kg/m.  General Appearance: Casual  Eye Contact:  Fair  Speech:  Normal Rate  Volume:  Normal  Mood:  Dysphoric  Affect:  Congruent  Thought Process:   Goal Directed and Descriptions of Associations: Intact  Orientation:  Full (Time, Place, and Person)  Thought Content: Logical   Suicidal Thoughts:  No  Homicidal Thoughts:  No  Memory:  Immediate;   Fair Recent;   Fair Remote;   Fair  Judgement:  Fair  Insight:  Fair  Psychomotor Activity:  Normal  Concentration:  Concentration: Fair and Attention Span: Fair  Recall:  [employer] of Knowledge: Fair  Language: Fair  Akathisia:  No  Handed:  Right  AIMS (if indicated): 0  Assets:  Communication Skills Desire for Improvement Housing Social Support  ADL's:  Intact  Cognition: WNL  Sleep:  Fair   Screenings:AIMS   Assessment and Plan: Anamika 37 year old Caucasian female who has a history of depression, anxiety, ADD as well as esophageal  achalasia, presented for a follow-up visit.  Millianna continues to have psychosocial stressors of her ex-husband who is also the father of her children recently released from prison, CPS involvement , her own mental health issues, her daughter's mental health issues and so on.  She also continues to struggle with attention and focus at this time.  Discussed medication management with patient.  Plan as noted below.  Plan Anxiety Continue Effexor XR 150 mg p.o. daily Continue hydroxyzine 25 mg p.o. 3 times daily as needed.  Depression Effexor XR 150 mg p.o. daily new Abilify 5 mg p.o. daily  For ADD Discontinue Ritalin for lack of efficacy Start Vyvanse 20 mg p.o. daily, give her 15 pills. Reviewed [place] controlled substance database. She will return in 15 days and further medication readjustment can be discussed at that time. I did medication education, provided handouts  Insomnia Continue doxepin 10-20 mg p.o. nightly as needed  Follow-up in clinic in 2 weeks or sooner if needed.  She will start intensive in-home therapy soon.  More than 50 % of the time was spent for psychoeducation and supportive psychotherapy and care coordination.  This  note was generated in part or whole with voice recognition software. Voice recognition is usually quite accurate but there are transcription errors that can and very often do occur. I apologize for any typographical errors that were not detected and corrected.         Jomarie LongsSaramma Mackson Botz, MD 09/30/2017, 1:01 PM

## 2017-10-01 ENCOUNTER — Telehealth: Payer: Self-pay

## 2017-10-01 DIAGNOSIS — F900 Attention-deficit hyperactivity disorder, predominantly inattentive type: Secondary | ICD-10-CM

## 2017-10-01 MED ORDER — METHYLPHENIDATE HCL ER (LA) 40 MG PO CP24: 40.0000 mg | ORAL_CAPSULE | ORAL | 0 refills | Status: DC

## 2017-10-01 NOTE — Telephone Encounter (Signed)
Do you want patient to be on ritalin until vyvanse is determined.

## 2017-10-01 NOTE — Telephone Encounter (Signed)
Pt was called and message was left that rx was sent into pharmacy until we hear back form the insurance on status of vyvanse.

## 2017-10-01 NOTE — Telephone Encounter (Signed)
Pt states she will need a new rx for the ritalin she only has a couple left can you send in a rx please.

## 2017-10-01 NOTE — Telephone Encounter (Signed)
pt called and asked about result.  told pt that we just got it in and it was denied and that i was going to do an appeal.  pt asked if she should continue with the adderall until we can get vyvanse approved.

## 2017-10-01 NOTE — Telephone Encounter (Signed)
pt called states that she needed a PA on the vyvanse.

## 2017-10-01 NOTE — Telephone Encounter (Signed)
Called pt, she is currently awaiting appeal for vyvanse. Will refill ritalin [place] for 7 days .

## 2017-10-01 NOTE — Telephone Encounter (Signed)
fax the appeal form over along with office notes to the appeal office. - pending review.

## 2017-10-01 NOTE — Telephone Encounter (Signed)
went online to [email or web address] and entered pa

## 2017-10-01 NOTE — Telephone Encounter (Signed)
I did

## 2017-10-01 NOTE — Telephone Encounter (Signed)
Yes

## 2017-10-01 NOTE — Telephone Encounter (Signed)
human sent faxed that vyvanse was denied.  I will do an appeal

## 2017-10-05 ENCOUNTER — Other Ambulatory Visit: Payer: Self-pay | Admitting: Psychiatry

## 2017-10-05 ENCOUNTER — Telehealth: Payer: Self-pay | Admitting: Psychiatry

## 2017-10-05 DIAGNOSIS — F900 Attention-deficit hyperactivity disorder, predominantly inattentive type: Secondary | ICD-10-CM

## 2017-10-05 MED ORDER — DEXMETHYLPHENIDATE HCL 5 MG PO TABS: 5.0000 mg | ORAL_TABLET | Freq: Every day | ORAL | 0 refills | Status: DC

## 2017-10-05 NOTE — Telephone Encounter (Signed)
Called pt to discuss denial of appeal for Vyvanse.  Will start Focalin 5 mg p.o. daily.  We will give her 7-day supply.  Provided medication education.  She will stop the Ritalin.

## 2017-10-05 NOTE — Telephone Encounter (Signed)
Called patient to discuss that Vyvanse was declined by her insurance.  We will start her on Focalin 5 mg p.o. daily.  She will stop the Ritalin.  Provided her medication education.  She will call me back in a week with concerns.

## 2017-10-05 NOTE — Telephone Encounter (Signed)
Insurance denied the appeal.  Please advise

## 2017-10-08 ENCOUNTER — Telehealth: Payer: Self-pay

## 2017-10-08 DIAGNOSIS — F411 Generalized anxiety disorder: Secondary | ICD-10-CM

## 2017-10-08 DIAGNOSIS — F331 Major depressive disorder, recurrent, moderate: Secondary | ICD-10-CM

## 2017-10-08 MED ORDER — ARIPIPRAZOLE 5 MG PO TABS: 5.0000 mg | ORAL_TABLET | Freq: Two times a day (BID) | ORAL | 0 refills | Status: DC

## 2017-10-08 NOTE — Telephone Encounter (Signed)
Sent abilify 90 days supply to pharmacy.

## 2017-10-08 NOTE — Telephone Encounter (Signed)
received a request from the pharmacy requesting a 90 day supply of aripiprazole 5mg  pt was last seen on  09-30-17 next appt  10-14-17  ARIPiprazole (ABILIFY) 5 MG tablet  Medication  Date: 09/07/2017 Department: [facility] Ordering/Authorizing: Jomarie LongsEappen, Saramma, MD  Order Providers   Prescribing Provider Encounter Provider  Jomarie LongsEappen, Saramma, MD Jomarie LongsEappen, Saramma, MD  Medication Detail    Disp Refills Start End   ARIPiprazole (ABILIFY) 5 MG tablet 60 tablet 1 09/07/2017    Sig - Route: Take 1 tablet (5 mg total) by mouth 2 (two) times daily. - Oral   Sent to pharmacy as: ARIPiprazole (ABILIFY) 5 MG tablet   E-Prescribing Status: Receipt confirmed by pharmacy (09/07/2017 11:24 AM EST)   Associated Diagnoses   Generalized anxiety disorder     Major depressive disorder, recurrent episode, moderate (HCC)

## 2017-10-14 ENCOUNTER — Other Ambulatory Visit: Payer: Self-pay

## 2017-10-14 ENCOUNTER — Ambulatory Visit (INDEPENDENT_AMBULATORY_CARE_PROVIDER_SITE_OTHER): Payer: Medicare Other | Admitting: Psychiatry

## 2017-10-14 ENCOUNTER — Encounter: Payer: Self-pay | Admitting: Psychiatry

## 2017-10-14 ENCOUNTER — Telehealth: Payer: Self-pay

## 2017-10-14 VITALS — BP 107/71 | HR 80 | Temp 97.7°F

## 2017-10-14 DIAGNOSIS — F331 Major depressive disorder, recurrent, moderate: Secondary | ICD-10-CM | POA: Diagnosis not present

## 2017-10-14 DIAGNOSIS — F900 Attention-deficit hyperactivity disorder, predominantly inattentive type: Secondary | ICD-10-CM

## 2017-10-14 DIAGNOSIS — F411 Generalized anxiety disorder: Secondary | ICD-10-CM | POA: Diagnosis not present

## 2017-10-14 DIAGNOSIS — F5105 Insomnia due to other mental disorder: Secondary | ICD-10-CM | POA: Diagnosis not present

## 2017-10-14 DIAGNOSIS — F902 Attention-deficit hyperactivity disorder, combined type: Secondary | ICD-10-CM

## 2017-10-14 MED ORDER — DEXMETHYLPHENIDATE HCL ER 10 MG PO CP24: 10.0000 mg | ORAL_CAPSULE | Freq: Every day | ORAL | 0 refills | Status: DC

## 2017-10-14 MED ORDER — DEXMETHYLPHENIDATE HCL 10 MG PO TABS: 10.0000 mg | ORAL_TABLET | Freq: Two times a day (BID) | ORAL | 0 refills | Status: DC

## 2017-10-14 MED ORDER — DOXEPIN HCL 10 MG PO CAPS: 10.0000 mg | ORAL_CAPSULE | Freq: Every evening | ORAL | 0 refills | Status: DC | PRN

## 2017-10-14 MED ORDER — VENLAFAXINE HCL ER 75 MG PO CP24: 150.0000 mg | ORAL_CAPSULE | Freq: Every day | ORAL | 0 refills | Status: DC

## 2017-10-14 NOTE — Telephone Encounter (Signed)
Sent new script to pharmacy since she cannot afford Focalin XR. Changed to Focalin 10 mg po bid.

## 2017-10-14 NOTE — Patient Instructions (Signed)
Dexmethylphenidate extended-release capsules  What is this medicine?  DEXMETHYLPHENIDATE (dex meth ill FEN i date) is used to treat attention-deficit hyperactivity disorder. Federal law prohibits the transfer of this medicine to any person other than the person for whom it was prescribed. Do not share this medicine with anyone else.  This medicine may be used for other purposes; ask your health care provider or pharmacist if you have questions.  COMMON BRAND NAME(S): Focalin XR  What should I tell my health care provider before I take this medicine?  They need to know if you have any of these conditions:  -anxiety or panic attacks  -circulation problems in fingers and toes  -glaucoma  -hardening or blockages of the arteries or heart blood vessels  -heart disease or a heart defect  -high blood pressure  -history of a drug or alcohol abuse problem  -history of stroke  -liver disease  -mental illness  -motor tics, family history or diagnosis of Tourette's syndrome  -seizures  -suicidal thoughts, plans, or attempt; a previous suicide attempt by you or a family member  -thyroid disease  -an unusual or allergic reaction to dexmethylphenidate, methylphenidate, other medicines, foods, dyes, or preservatives  -pregnant or trying to get pregnant  -breast-feeding  How should I use this medicine?  Take this medicine by mouth with a glass of water. Follow the directions on the prescription label. Swallow whole. Do not crush, cut, or chew. The capsule may be opened and the dose gently sprinkled on a small amount (1 tablespoon) of cool applesauce. Do not sprinkle on warm applesauce or this may result in improper dosing. The sprinkles should not be crushed or chewed. Take immediately after sprinkling. Do not store for future use. Drink some fluids (water, milk or juice) after taking the sprinkles with applesauce. You can take this medicine with or without food. Take your doses at regular intervals. Do not take your medicine more  often than directed.  A special MedGuide will be given to you by the pharmacist with each prescription and refill. Be sure to read this information carefully each time.  Talk to your pediatrician regarding the use of this medicine in children. While this medicine may be prescribed for children as young as 6 years for selected conditions, precautions do apply.  Overdosage: If you think you have taken too much of this medicine contact a poison control center or emergency room at once.  NOTE: This medicine is only for you. Do not share this medicine with others.  What if I miss a dose?  If you miss a dose, take it as soon as you can. If it is almost time for your next dose, take only that dose. Do not take double or extra doses.  What may interact with this medicine?  Do not take this medicine with any of the following medications:  -lithium  -MAOIs like Carbex, Eldepryl, Marplan, Nardil, and Parnate  -other stimulant medicines for attention disorders, weight loss, or to stay awake  -procarbazine  This medicine may also interact with the following medications:  -atomoxetine  -caffeine  -certain medicines for blood pressure, heart disease, irregular heart beat  -certain medicines for depression, anxiety, or psychotic disturbances  -certain medicines for seizures like carbamazepine, phenobarbital, phenytoin  -cold or allergy medicines  -medicines that increase the blood pressure like dopamine, dobutamine, or ephedrine  -warfarin  This list may not describe all possible interactions. Give your health care provider a list of all the medicines, herbs, non-prescription drugs,   or dietary supplements you use. Also tell them if you smoke, drink alcohol, or use illegal drugs. Some items may interact with your medicine.  What should I watch for while using this medicine?  Visit your doctor or health care professional for regular checks on your progress. This prescription requires that you follow special procedures with your  doctor and pharmacy. You will need to have a new written prescription from your doctor or health care professional every time you need a refill.  This medicine may affect your concentration, or hide signs of tiredness. Until you know how this drug affects you, do not drive, ride a bicycle, use machinery, or do anything that needs mental alertness.  Tell your doctor or health care professional if this medicine loses its effects, or if you feel you need to take more than the prescribed amount. Do not change the dosage without talking to your doctor or health care professional.  For males, contact you doctor or health care professional right away if you have an erection that lasts longer than 4 hours or if it becomes painful. This may be a sign of serious problem and must be treated right away to prevent permanent damage.  Decreased appetite is a common side effect when starting this medicine. Eating small, frequent meals or snacks can help. Talk to your doctor if you continue to have poor eating habits. Height and weight growth of a child taking this medicine will be monitored closely.  Do not take this medicine close to bedtime. It may prevent you from sleeping.  If you are going to need surgery, a MRI, CT scan, or other procedure, tell your doctor that you are taking this medicine. You may need to stop taking this medicine before the procedure.  Tell your doctor or healthcare professional right away if you notice unexplained wounds on your fingers and toes while taking this medicine. You should also tell your healthcare provider if you experience numbness or pain, changes in the skin color, or sensitivity to temperature in your fingers or toes.  What side effects may I notice from receiving this medicine?  Side effects that you should report to your doctor or health care professional as soon as possible:  -allergic reactions like skin rash, itching or hives, swelling of the face, lips, or tongue  -changes in  vision  -chest pain or chest tightness  -confusion, trouble speaking or understanding  -fast, irregular heartbeat  -fingers or toes feel numb, cool, painful  -hallucination, loss of contact with reality  -high blood pressure  -males: prolonged or painful erection  -seizures  -severe headaches  -shortness of breath  -suicidal thoughts or other mood changes  -trouble walking, dizziness, loss of balance or coordination  -uncontrollable head, mouth, neck, arm, or leg movements  -unusual bleeding or bruising  Side effects that usually do not require medical attention (report to your doctor or health care professional if they continue or are bothersome):  -anxious  -headache  -loss of appetite  -nausea, vomiting  -trouble sleeping  -weight loss  This list may not describe all possible side effects. Call your doctor for medical advice about side effects. You may report side effects to FDA at 1-800-FDA-1088.  Where should I keep my medicine?  Keep out of the reach of children. This medicine can be abused. Keep your medicine in a safe place to protect it from theft. Do not share this medicine with anyone. Selling or giving away this medicine is dangerous and against   the law.  This medicine may cause accidental overdose and death if taken by other adults, children, or pets. Mix any unused medicine with a substance like cat litter or coffee grounds. Then throw the medicine away in a sealed container like a sealed bag or a coffee can with a lid. Do not use the medicine after the expiration date.  Store at room temperature between 15 and 30 degrees C (59 and 86 degrees F). Keep container tightly closed.  NOTE: This sheet is a summary. It may not cover all possible information. If you have questions about this medicine, talk to your doctor, pharmacist, or health care provider.   2018 Elsevier/Gold Standard (2014-04-18 15:08:08)

## 2017-10-14 NOTE — Telephone Encounter (Signed)
pt called states that her insurance will not pay for the focalin xr 10mg  she needs a new rx for focalin 10mg  bid. [facility] and she was wondering if she could speak with you also.

## 2017-10-14 NOTE — Progress Notes (Signed)
[facility] MD OP Progress Note  10/14/2017 1:56 PM [name] N Tugwell  MRN:  [id number]  Chief Complaint: ' I still need help focusing.' Chief Complaint    Follow-up; Medication Refill     HPI: [name] [name] is a Caucasian female, divorced, lives in [place], is on SSD, has a history of depression, anxiety, ADD, GI issues like achalasia, presented to the clinic today for a follow-up visit.  Jeannett today reports that she is having some trouble with her attention and concentration in spite of the medication change.  She reports her attention and concentration are better on the Focalin which was started a week ago.  She however reports that she continues to feel she may need a dose increase.  She reports her appetite is less when she is on the medication but she has noticed that when she stops taking it ,it comes back.  She was worried about increased appetite and weight gain on the Abilify.  She however reports that that is getting better and she would like to continue the Abilify for her mood symptoms.  She reports good sleep on the doxepin 20 mg which has been helpful.  She reports she continues to have psychosocial stressors from her children being home schooled.  She has a 39 year old and a 39 year old at home.  One of her kids are in homebound school and the other kid had to be pulled out of school and she is trying to home school her too.  Patient reports that they have services available like intensive in-home therapy and that has been helpful.  Patient however continues to need support with home schooling as well as managing her children who deal with their own mental health issues.  Patient reports their father, her ex-husband is currently involved.  The kids stay with him every weekend and that has been helpful to her because she gets some time to take a break.  She reports she has currently started dating someone and that is going well.  She reports her aunt Luster LandsbergRenee continues to be supportive.  She  continues to deny any substance abuse problems. Visit Diagnosis:    ICD-10-CM   1. MDD (major depressive disorder), recurrent episode, moderate (HCC) F33.1 venlafaxine XR (EFFEXOR XR) 75 MG 24 hr capsule    doxepin (SINEQUAN) 10 MG capsule  2. GAD (generalized anxiety disorder) F41.1 venlafaxine XR (EFFEXOR XR) 75 MG 24 hr capsule    doxepin (SINEQUAN) 10 MG capsule  3. Insomnia due to mental disorder F51.05 doxepin (SINEQUAN) 10 MG capsule  4. Attention deficit hyperactivity disorder (ADHD), combined type F90.2 dexmethylphenidate (FOCALIN XR) 10 MG 24 hr capsule    Past Psychiatric History: Hx of ADD, bipolar disorder, MDD, GAD.  History of IP admission for mental illness in the past.  Denies suicide attempts.  Has tried medications like Cymbalta, Celexa, Wellbutrin, Lamictal, Adderall, Ritalin.  Past Medical History:  Past Medical History:  Diagnosis Date  . ADHD (attention deficit hyperactivity disorder)   . Anxiety   . Depression     Past Surgical History:  Procedure Laterality Date  . ABDOMINAL SURGERY    . CHOLECYSTECTOMY    . ESOPHAGECTOMY      Family Psychiatric History: Father , mother -anger issues, anxiety, depression.  Daughter-depression.  Family History:  Family History  Problem Relation Age of Onset  . Anxiety disorder Mother   . Anxiety disorder Father   . ADD / ADHD Brother   . Anxiety disorder Brother   . Anxiety disorder Maternal  Grandmother   Substance abuse history: Denies   Social History: Born in [place], raised by parents.  Married and divorced twice to her high school sweetheart.  He is currently out of prison and is currently involved in her children's care( he is the biological father of one of her children , but fathers both of them).  She has 2 daughters ages 54 and 110 years old.  She is on Social Security disability.  She has good social support from her aunt. Social History   Socioeconomic History  . Marital status: Single    Spouse name: None   . Number of children: None  . Years of education: None  . Highest education level: None  Social Needs  . Financial resource strain: None  . Food insecurity - worry: None  . Food insecurity - inability: None  . Transportation needs - medical: None  . Transportation needs - non-medical: None  Occupational History  . None  Tobacco Use  . Smoking status: Never Smoker  . Smokeless tobacco: Never Used  Substance and Sexual Activity  . Alcohol use: No  . Drug use: Yes    Types: Marijuana    Comment: last used 6 mths ago  . Sexual activity: Not Currently  Other Topics Concern  . None  Social History Narrative  . None    Allergies:  Allergies  Allergen Reactions  . Fentanyl Hives  . Metoclopramide Other (See Comments)  . Morphine Hives  . Butalbital-Apap-Caffeine Other (See Comments)    Paranoia  . Diazepam Other (See Comments)  . Penicillins   . Promethazine     seizures  . Latex Rash  . Lorazepam Other (See Comments)    combative    Metabolic Disorder Labs: No results found for: HGBA1C, MPG No results found for: PROLACTIN No results found for: CHOL, TRIG, HDL, CHOLHDL, VLDL, LDLCALC Lab Results  Component Value Date   TSH 0.733 06/19/2010   TSH 0.875 09/27/2009    Therapeutic Level Labs: No results found for: LITHIUM No results found for: VALPROATE No components found for:  CBMZ  Current Medications: Current Outpatient Medications  Medication Sig Dispense Refill  . ARIPiprazole (ABILIFY) 5 MG tablet Take 1 tablet (5 mg total) by mouth 2 (two) times daily. 180 tablet 0  . doxepin (SINEQUAN) 10 MG capsule Take 1-2 capsules (10-20 mg total) by mouth at bedtime as needed. 180 capsule 0  . hydrOXYzine (VISTARIL) 25 MG capsule Take 1 capsule (25 mg total) by mouth 3 (three) times daily as needed for anxiety. 75 capsule 1  . ondansetron (ZOFRAN-ODT) 4 MG disintegrating tablet Take 8 mg by mouth.    . pantoprazole (PROTONIX) 20 MG tablet Take 40 mg by mouth.    .  venlafaxine XR (EFFEXOR XR) 75 MG 24 hr capsule Take 2 capsules (150 mg total) by mouth daily with breakfast. 180 capsule 0  . dexmethylphenidate (FOCALIN XR) 10 MG 24 hr capsule Take 1 capsule (10 mg total) by mouth daily. 15 capsule 0   No current facility-administered medications for this visit.      Musculoskeletal: Strength & Muscle Tone: within normal limits Gait & Station: normal Patient leans: N/A  Psychiatric Specialty Exam: Review of Systems  Psychiatric/Behavioral: Positive for depression. The patient is nervous/anxious.   All other systems reviewed and are negative.   Blood pressure 107/71, pulse 80, temperature 97.7 F (36.5 C), temperature source Oral.There is no height or weight on file to calculate BMI.  General Appearance: Casual  Eye Contact:  Fair  Speech:  Clear and Coherent  Volume:  Normal  Mood:  Dysphoric  Affect:  Appropriate  Thought Process:  Goal Directed and Descriptions of Associations: Intact  Orientation:  Full (Time, Place, and Person)  Thought Content: Logical   Suicidal Thoughts:  No  Homicidal Thoughts:  No  Memory:  Immediate;   Fair Recent;   Fair Remote;   Fair  Judgement:  Fair  Insight:  Fair  Psychomotor Activity:  Normal  Concentration:  Concentration: Fair and Attention Span: Fair  Recall:  [employer] of Knowledge: Fair  Language: Fair  Akathisia:  No  Handed:  Right  AIMS (if indicated): 0  Assets:  Communication Skills Desire for Improvement Housing Social Support Transportation Vocational/Educational  ADL's:  Intact  Cognition: WNL  Sleep:  Fair   Screenings:   Assessment and Plan: [name] [name] is a 37 year old Caucasian female who has a history of depression, anxiety, ADD as well as esophageal achalasia, presented today for a follow-up visit.  Cayce continues to have trouble with attention and concentration.  She has tolerated the Focalin which was prescribed last week .  She continues to have psychosocial stressors of  dealing with her own mental health issues as well as mental health problems of her children.  She does have social support system available and also services in place like intensive in-home therapy for her children.  Patient continues to deny any substance abuse problems at this time.  Plan as noted below.  Plan Anxiety do Continue Effexor XR 150 mg p.o. daily Continue hydroxyzine 25 mg p.o. 3 times daily as needed.  Depression Effexor XR 150 mg p.o. daily. Abilify 5 mg p.o. twice daily.  For ADD Discontinue Focalin immediate release. Start Focalin XR 10 mg p.o. Daily.  Insomnia Continue doxepin 10-20 mg p.o. nightly as needed.  Follow-up in clinic in 2 weeks or sooner if needed.  More than 50 % of the time was spent for psychoeducation and supportive psychotherapy and care coordination.  This note was generated in part or whole with voice recognition software. Voice recognition is usually quite accurate but there are transcription errors that can and very often do occur. I apologize for any typographical errors that were not detected and corrected.        Jomarie Longs, MD 10/14/2017, 1:56 PM

## 2017-10-27 ENCOUNTER — Ambulatory Visit (INDEPENDENT_AMBULATORY_CARE_PROVIDER_SITE_OTHER): Payer: Medicare Other | Admitting: Psychiatry

## 2017-10-27 ENCOUNTER — Encounter: Payer: Self-pay | Admitting: Psychiatry

## 2017-10-27 ENCOUNTER — Other Ambulatory Visit: Payer: Self-pay

## 2017-10-27 ENCOUNTER — Telehealth: Payer: Self-pay

## 2017-10-27 VITALS — BP 99/69 | HR 87 | Temp 98.3°F | Wt 197.0 lb

## 2017-10-27 DIAGNOSIS — F331 Major depressive disorder, recurrent, moderate: Secondary | ICD-10-CM | POA: Diagnosis not present

## 2017-10-27 DIAGNOSIS — F900 Attention-deficit hyperactivity disorder, predominantly inattentive type: Secondary | ICD-10-CM

## 2017-10-27 DIAGNOSIS — F411 Generalized anxiety disorder: Secondary | ICD-10-CM | POA: Diagnosis not present

## 2017-10-27 DIAGNOSIS — F902 Attention-deficit hyperactivity disorder, combined type: Secondary | ICD-10-CM | POA: Diagnosis not present

## 2017-10-27 MED ORDER — ARIPIPRAZOLE 5 MG PO TABS: 5.0000 mg | ORAL_TABLET | Freq: Every day | ORAL | 1 refills | Status: DC

## 2017-10-27 MED ORDER — AMPHETAMINE-DEXTROAMPHET ER 20 MG PO CP24: 20.0000 mg | ORAL_CAPSULE | Freq: Every day | ORAL | 0 refills | Status: DC

## 2017-10-27 MED ORDER — AMPHETAMINE-DEXTROAMPHETAMINE 20 MG PO TABS: 10.0000 mg | ORAL_TABLET | Freq: Two times a day (BID) | ORAL | 0 refills | Status: DC

## 2017-10-27 MED ORDER — VENLAFAXINE HCL ER 75 MG PO CP24: 225.0000 mg | ORAL_CAPSULE | Freq: Every day | ORAL | 1 refills | Status: DC

## 2017-10-27 NOTE — Telephone Encounter (Signed)
pt called states that the insurance will not pay for the extend release.  needs to be regular.

## 2017-10-27 NOTE — Telephone Encounter (Signed)
SENT TO PHARMACY - REG[facility]

## 2017-10-27 NOTE — Progress Notes (Signed)
BH MD  OP Progress Note  10/27/2017 4:24 PM [name] N Stadel  MRN:  [id number]  Chief Complaint: ' I am losing focus." Chief Complaint    Follow-up; Medication Problem; Weight Gain     HPI: [name] [name] is a 37 year old Caucasian female, divorced, lives in [place], is on SSD, has a history of depression, anxiety, ADD, GI issues like achalasia, presented to the clinic today for a follow-up visit.  Pt  today reports that she does not think the Focalin is actually working for her.  She reports she has been unable to focus and concentrate.  She reports she would like to retry the Adderall which she was on when she were younger.  She reports she spoke to her mother who reported that she may have tolerated the Adderall well in the past.  [name] [name] continues to be worried about her weight gain issues.  She reports she has gained a lot of weight since starting the Abilify.  She wonders whether there is anything to be done to address her weight gain issues.  Discussed reducing the dose of Abilify.  Discussed increasing the Effexor to target her depression and anxiety.  She agreed with plan.  Patient otherwise denies any concerns at this time.   Visit Diagnosis:    ICD-10-CM   1. MDD (major depressive disorder), recurrent episode, moderate (HCC) F33.1 ARIPiprazole (ABILIFY) 5 MG tablet    venlafaxine XR (EFFEXOR XR) 75 MG 24 hr capsule  2. GAD (generalized anxiety disorder) F41.1 venlafaxine XR (EFFEXOR XR) 75 MG 24 hr capsule  3. Attention deficit hyperactivity disorder (ADHD), combined type F90.2 DISCONTINUED: amphetamine-dextroamphetamine (ADDERALL XR) 20 MG 24 hr capsule    Past Psychiatric History: Hx of ADD, bipolar disorder, MDD, GAD.  History of IP admission for mental illness in the past.  Denies suicide attempts.  Has tried medications like Cymbalta, Celexa, Wellbutrin, Lamictal, Adderall, Ritalin in the past.  Past Medical History:  Past Medical History:  Diagnosis Date  . ADHD (attention  deficit hyperactivity disorder)   . Anxiety   . Depression     Past Surgical History:  Procedure Laterality Date  . ABDOMINAL SURGERY    . CHOLECYSTECTOMY    . ESOPHAGECTOMY      Family Psychiatric History: Father ,mother-anger issues, anxiety, depression.  Daughter-depression.  Family History:  Family History  Problem Relation Age of Onset  . Anxiety disorder Mother   . Anxiety disorder Father   . ADD / ADHD Brother   . Anxiety disorder Brother   . Anxiety disorder Maternal Grandmother    Substance abuse history: Denies  Social History: Born in [place], raised by parents.  Married and divorced twice to her high school sweetheart.  He is currently out of prison and is currently involved in her children's care (he is the biological father of 1 of her children, but father's both of them).  She has 2 daughters ages 6513 and 39 years old.  She is on Social Security disability.  She has good social support from her aunt. Social History   Socioeconomic History  . Marital status: Single    Spouse name: None  . Number of children: None  . Years of education: None  . Highest education level: None  Social Needs  . Financial resource strain: None  . Food insecurity - worry: None  . Food insecurity - inability: None  . Transportation needs - medical: None  . Transportation needs - non-medical: None  Occupational History  . None  Tobacco  Use  . Smoking status: Never Smoker  . Smokeless tobacco: Never Used  Substance and Sexual Activity  . Alcohol use: No  . Drug use: Yes    Types: Marijuana    Comment: last used 6 mths ago  . Sexual activity: Not Currently  Other Topics Concern  . None  Social History Narrative  . None    Allergies:  Allergies  Allergen Reactions  . Fentanyl Hives  . Metoclopramide Other (See Comments)  . Morphine Hives  . Butalbital-Apap-Caffeine Other (See Comments)    Paranoia  . Diazepam Other (See Comments)  . Penicillins   . Promethazine      seizures  . Latex Rash  . Lorazepam Other (See Comments)    combative    Metabolic Disorder Labs: No results found for: HGBA1C, MPG No results found for: PROLACTIN No results found for: CHOL, TRIG, HDL, CHOLHDL, VLDL, LDLCALC Lab Results  Component Value Date   TSH 0.733 06/19/2010   TSH 0.875 09/27/2009    Therapeutic Level Labs: No results found for: LITHIUM No results found for: VALPROATE No components found for:  CBMZ  Current Medications: Current Outpatient Medications  Medication Sig Dispense Refill  . ARIPiprazole (ABILIFY) 5 MG tablet Take 1 tablet (5 mg total) by mouth daily. 30 tablet 1  . doxepin (SINEQUAN) 10 MG capsule Take 1-2 capsules (10-20 mg total) by mouth at bedtime as needed. 180 capsule 0  . hydrOXYzine (VISTARIL) 25 MG capsule Take 1 capsule (25 mg total) by mouth 3 (three) times daily as needed for anxiety. 75 capsule 1  . ondansetron (ZOFRAN-ODT) 4 MG disintegrating tablet Take 8 mg by mouth.    . pantoprazole (PROTONIX) 20 MG tablet Take 40 mg by mouth.    . venlafaxine XR (EFFEXOR XR) 75 MG 24 hr capsule Take 3 capsules (225 mg total) by mouth daily with breakfast. 90 capsule 1  . amphetamine-dextroamphetamine (ADDERALL) 20 MG tablet Take 0.5 tablets (10 mg total) by mouth 2 (two) times daily. Take 10 mg in the AM and 10 mg at noon. 30 tablet 0   No current facility-administered medications for this visit.      Musculoskeletal: Strength & Muscle Tone: within normal limits Gait & Station: normal Patient leans: N/A  Psychiatric Specialty Exam: Review of Systems  Psychiatric/Behavioral: The patient is nervous/anxious.   All other systems reviewed and are negative.   Blood pressure 99/69, pulse 87, temperature 98.3 F (36.8 C), temperature source Oral, weight 197 lb (89.4 kg).Body mass index is 34.9 kg/m.  General Appearance: Casual  Eye Contact:  Fair  Speech:  Normal Rate  Volume:  Normal  Mood:  Anxious  Affect:  Congruent  Thought  Process:  Goal Directed and Descriptions of Associations: Intact  Orientation:  Full (Time, Place, and Person)  Thought Content: Logical   Suicidal Thoughts:  No  Homicidal Thoughts:  No  Memory:  Immediate;   Fair Recent;   Fair Remote;   Fair  Judgement:  Fair  Insight:  Fair  Psychomotor Activity:  Normal  Concentration:  Concentration: Fair and Attention Span: Fair  Recall:  [employer] of Knowledge: Fair  Language: Fair  Akathisia:  No  Handed:  Right  AIMS (if indicated):denies tremors, rigidity  Assets:  Communication Skills Desire for Improvement Housing Social Support  ADL's:  Intact  Cognition: WNL  Sleep:  Fair   Screenings:   Assessment and Plan: Rajah is a 37 year old Caucasian female who has a history of depression,  anxiety, ADD as well as esophageal achalasia, presented today for a follow-up visit.  She continues to struggle with attention and focus.  She reports the Focalin is not helping and she would like to try the Adderall again.  Discussed with her that if she continues to have trouble with her concentration then she can be referred for a genesight testing.  She agrees with plan.  Also discussed changing her medication dosage to address her weight gain issues.  She continues to have social support system available and also has services in place like intensive in-home therapy for her children.  Plan as noted below.  Plan Anxiety disorder Increase Effexor XR to 225 mg p.o. daily Continue hydroxyzine 25 mg p.o. 3 times daily as needed.  Depression Increase Effexor XR to 225 mg p.o. daily Reduce Abilify to 5 mg p.o. daily to address weight gain issues.  If she continues to change have problems with weight gain may taper her off off of the Abilify.  For ADD Discontinue Focalin XR Start Adderall XR 20 mg p.o. daily  For insomnia Continue doxepin 10-20 mg p.o. nightly as needed  More than 50 % of the time was spent for psychoeducation and supportive  psychotherapy and care coordination.  This note was generated in part or whole with voice recognition software. Voice recognition is usually quite accurate but there are transcription errors that can and very often do occur. I apologize for any typographical errors that were not detected and corrected.       Jomarie Longs, MD 10/28/2017, 12:30 PM

## 2017-10-27 NOTE — Patient Instructions (Signed)
Venlafaxine tablets What is this medicine? VENLAFAXINE (VEN la fax een) is used to treat depression, anxiety and panic disorder. This medicine may be used for other purposes; ask your health care provider or pharmacist if you have questions. COMMON BRAND NAME(S): Effexor What should I tell my health care provider before I take this medicine? They need to know if you have any of these conditions: -bleeding disorders -glaucoma -heart disease -high blood pressure -high cholesterol -kidney disease -liver disease -low levels of sodium in the blood -mania or bipolar disorder -seizures -suicidal thoughts, plans, or attempt; a previous suicide attempt by you or a family -take medicines that treat or prevent blood clots -thyroid disease -an unusual or allergic reaction to venlafaxine, desvenlafaxine, other medicines, foods, dyes, or preservatives -pregnant or trying to get pregnant -breast-feeding How should I use this medicine? Take this medicine by mouth with a glass of water. Follow the directions on the prescription label. Take it with food. Take your medicine at regular intervals. Do not take your medicine more often than directed. Do not stop taking this medicine suddenly except upon the advice of your doctor. Stopping this medicine too quickly may cause serious side effects or your condition may worsen. A special MedGuide will be given to you by the pharmacist with each prescription and refill. Be sure to read this information carefully each time. Talk to your pediatrician regarding the use of this medicine in children. Special care may be needed. Overdosage: If you think you have taken too much of this medicine contact a poison control center or emergency room at once. NOTE: This medicine is only for you. Do not share this medicine with others. What if I miss a dose? If you miss a dose, take it as soon as you can. If it is almost time for your next dose, take only that dose. Do not take  double or extra doses. What may interact with this medicine? Do not take this medicine with any of the following medications: -certain medicines for fungal infections like fluconazole, itraconazole, ketoconazole, posaconazole, voriconazole -cisapride -desvenlafaxine -dofetilide -dronedarone -duloxetine -levomilnacipran -linezolid -MAOIs like Carbex, Eldepryl, Marplan, Nardil, and Parnate -methylene blue (injected into a vein) -milnacipran -pimozide -thioridazine -ziprasidone This medicine may also interact with the following medications: -amphetamines -aspirin and aspirin-like medicines -certain medicines for depression, anxiety, or psychotic disturbances -certain medicines for migraine headaches like almotriptan, eletriptan, frovatriptan, naratriptan, rizatriptan, sumatriptan, zolmitriptan -certain medicines for sleep -certain medicines that treat or prevent blood clots like dalteparin, enoxaparin, warfarin -cimetidine -clozapine -diuretics -fentanyl -furazolidone -indinavir -isoniazid -lithium -metoprolol -NSAIDS, medicines for pain and inflammation, like ibuprofen or naproxen -other medicines that prolong the QT interval (cause an abnormal heart rhythm) -procarbazine -rasagiline -supplements like St. John's wort, kava kava, valerian -tramadol -tryptophan This list may not describe all possible interactions. Give your health care provider a list of all the medicines, herbs, non-prescription drugs, or dietary supplements you use. Also tell them if you smoke, drink alcohol, or use illegal drugs. Some items may interact with your medicine. What should I watch for while using this medicine? Tell your doctor if your symptoms do not get better or if they get worse. Visit your doctor or health care professional for regular checks on your progress. Because it may take several weeks to see the full effects of this medicine, it is important to continue your treatment as prescribed  by your doctor. Patients and their families should watch out for new or worsening thoughts of suicide or depression. Also   watch out for sudden changes in feelings such as feeling anxious, agitated, panicky, irritable, hostile, aggressive, impulsive, severely restless, overly excited and hyperactive, or not being able to sleep. If this happens, especially at the beginning of treatment or after a change in dose, call your health care professional. This medicine can cause an increase in blood pressure. Check with your doctor for instructions on monitoring your blood pressure while taking this medicine. You may get drowsy or dizzy. Do not drive, use machinery, or do anything that needs mental alertness until you know how this medicine affects you. Do not stand or sit up quickly, especially if you are an older patient. This reduces the risk of dizzy or fainting spells. Alcohol may interfere with the effect of this medicine. Avoid alcoholic drinks. Your mouth may get dry. Chewing sugarless gum, sucking hard candy and drinking plenty of water will help. Contact your doctor if the problem does not go away or is severe. What side effects may I notice from receiving this medicine? Side effects that you should report to your doctor or health care professional as soon as possible: -allergic reactions like skin rash, itching or hives, swelling of the face, lips, or tongue -anxious -breathing problems -confusion -changes in vision -chest pain -confusion -elevated mood, decreased need for sleep, racing thoughts, impulsive behavior -eye pain -fast, irregular heartbeat -feeling faint or lightheaded, falls -feeling agitated, angry, or irritable -hallucination, loss of contact with reality -high blood pressure -loss of balance or coordination -palpitations -redness, blistering, peeling or loosening of the skin, including inside the mouth -restlessness, pacing, inability to keep still -seizures -stiff  muscles -suicidal thoughts or other mood changes -trouble passing urine or change in the amount of urine -trouble sleeping -unusual bleeding or bruising -unusually weak or tired -vomiting Side effects that usually do not require medical attention (report to your doctor or health care professional if they continue or are bothersome): -change in sex drive or performance -change in appetite or weight -constipation -dizziness -dry mouth -headache -increased sweating -nausea -tired This list may not describe all possible side effects. Call your doctor for medical advice about side effects. You may report side effects to FDA at 1-800-FDA-1088. Where should I keep my medicine? Keep out of the reach of children. Store at a controlled temperature between 20 and 25 degrees C (68 and 77 degrees F), in a dry place. Throw away any unused medicine after the expiration date. NOTE: This sheet is a summary. It may not cover all possible information. If you have questions about this medicine, talk to your doctor, pharmacist, or health care provider.  2018 Elsevier/Gold Standard (2015-12-27 18:42:26) Amphetamine; Dextroamphetamine tablets What is this medicine? AMPHETAMINE; DEXTROAMPHETAMINE(am FET a meen; dex troe am FET a meen) is used to treat attention-deficit hyperactivity disorder (ADHD). It may also be used for narcolepsy. Federal law prohibits giving this medicine to any person other than the person for whom it was prescribed. Do not share this medicine with anyone else. This medicine may be used for other purposes; ask your health care provider or pharmacist if you have questions. COMMON BRAND NAME(S): Adderall What should I tell my health care provider before I take this medicine? They need to know if you have any of these conditions: -anxiety or panic attacks -circulation problems in fingers and toes -glaucoma -hardening or blockages of the arteries or heart blood vessels -heart disease or  a heart defect -high blood pressure -history of a drug or alcohol abuse problem -history of  stroke -kidney disease -liver disease -mental illness -seizures -suicidal thoughts, plans, or attempt; a previous suicide attempt by you or a family member -thyroid disease -Tourette's syndrome -an unusual or allergic reaction to dextroamphetamine, other amphetamines, other medicines, foods, dyes, or preservatives -pregnant or trying to get pregnant -breast-feeding How should I use this medicine? Take this medicine by mouth with a glass of water. Follow the directions on the prescription label. Take your doses at regular intervals. Do not take your medicine more often than directed. Do not suddenly stop your medicine. You must gradually reduce the dose or you may feel withdrawal effects. Ask your doctor or health care professional for advice. Talk to your pediatrician regarding the use of this medicine in children. Special care may be needed. While this drug may be prescribed for children as young as 3 years for selected conditions, precautions do apply. Overdosage: If you think you have taken too much of this medicine contact a poison control center or emergency room at once. NOTE: This medicine is only for you. Do not share this medicine with others. What if I miss a dose? If you miss a dose, take it as soon as you can. If it is almost time for your next dose, take only that dose. Do not take double or extra doses. What may interact with this medicine? Do not take this medicine with any of the following medications: -MAOIS like Carbex, Eldepryl, Marplan, Nardil, and Parnate -other stimulant medicines for attention disorders, weight loss, or to stay awake This medicine may also interact with the following medications: -acetazolamide -ammonium chloride -antacids -ascorbic acid -atomoxetine -caffeine -certain medicines for blood pressure -certain medicines for depression, anxiety, or psychotic  disturbances -certain medicines for seizures like carbamazepine, phenobarbital, phenytoin -certain medicines for stomach problems like cimetidine, famotidine, omeprazole, lansoprazole -cold or allergy medicines -glutamic acid -lithium -meperidine -methenamine; sodium acid phosphate -narcotic medicines for pain -norepinephrine -phenothiazines like chlorpromazine, mesoridazine, prochlorperazine, thioridazine -sodium acid phosphate -sodium bicarbonate This list may not describe all possible interactions. Give your health care provider a list of all the medicines, herbs, non-prescription drugs, or dietary supplements you use. Also tell them if you smoke, drink alcohol, or use illegal drugs. Some items may interact with your medicine. What should I watch for while using this medicine? Visit your doctor or health care professional for regular checks on your progress. This prescription requires that you follow special procedures with your doctor and pharmacy. You will need to have a new written prescription from your doctor every time you need a refill. This medicine may affect your concentration, or hide signs of tiredness. Until you know how this medicine affects you, do not drive, ride a bicycle, use machinery, or do anything that needs mental alertness. Tell your doctor or health care professional if this medicine loses its effects, or if you feel you need to take more than the prescribed amount. Do not change the dosage without talking to your doctor or health care professional. Decreased appetite is a common side effect when starting this medicine. Eating small, frequent meals or snacks can help. Talk to your doctor if you continue to have poor eating habits. Height and weight growth of a child taking this medicine will be monitored closely. Do not take this medicine close to bedtime. It may prevent you from sleeping. If you are going to need surgery, a MRI, CT scan, or other procedure, tell your  doctor that you are taking this medicine. You may need to stop taking  this medicine before the procedure. Tell your doctor or healthcare professional right away if you notice unexplained wounds on your fingers and toes while taking this medicine. You should also tell your healthcare provider if you experience numbness or pain, changes in the skin color, or sensitivity to temperature in your fingers or toes. What side effects may I notice from receiving this medicine? Side effects that you should report to your doctor or health care professional as soon as possible: -allergic reactions like skin rash, itching or hives, swelling of the face, lips, or tongue -changes in vision -chest pain or chest tightness -confusion, trouble speaking or understanding -fast, irregular heartbeat -fingers or toes feel numb, cool, painful -hallucination, loss of contact with reality -high blood pressure -males: prolonged or painful erection -seizures -severe headaches -shortness of breath -suicidal thoughts or other mood changes -trouble walking, dizziness, loss of balance or coordination -uncontrollable head, mouth, neck, arm, or leg movements Side effects that usually do not require medical attention (report to your doctor or health care professional if they continue or are bothersome): -anxious -headache -loss of appetite -nausea, vomiting -trouble sleeping -weight loss This list may not describe all possible side effects. Call your doctor for medical advice about side effects. You may report side effects to FDA at 1-800-FDA-1088. Where should I keep my medicine? Keep out of the reach of children. This medicine can be abused. Keep your medicine in a safe place to protect it from theft. Do not share this medicine with anyone. Selling or giving away this medicine is dangerous and against the law. Store at room temperature between 15 and 30 degrees C (59 and 86 degrees F). Keep container tightly closed. Throw  away any unused medicine after the expiration date. Dispose of properly. This medicine may cause accidental overdose and death if it is taken by other adults, children, or pets. Mix any unused medicine with a substance like cat litter or coffee grounds. Then throw the medicine away in a sealed container like a sealed bag or a coffee can with a lid. Do not use the medicine after the expiration date. NOTE: This sheet is a summary. It may not cover all possible information. If you have questions about this medicine, talk to your doctor, pharmacist, or health care provider.  2018 Elsevier/Gold Standard (2014-05-31 18:44:41)

## 2017-10-28 ENCOUNTER — Encounter: Payer: Self-pay | Admitting: Psychiatry

## 2017-10-28 ENCOUNTER — Ambulatory Visit: Payer: Medicare Other | Admitting: Psychiatry

## 2017-11-17 ENCOUNTER — Ambulatory Visit: Payer: Medicare Other | Admitting: Psychiatry

## 2017-12-08 ENCOUNTER — Encounter: Payer: Self-pay | Admitting: Psychiatry

## 2017-12-08 ENCOUNTER — Other Ambulatory Visit: Payer: Self-pay

## 2017-12-08 ENCOUNTER — Ambulatory Visit (INDEPENDENT_AMBULATORY_CARE_PROVIDER_SITE_OTHER): Payer: Medicare Other | Admitting: Psychiatry

## 2017-12-08 VITALS — BP 96/65 | HR 114 | Temp 98.3°F | Wt 201.8 lb

## 2017-12-08 DIAGNOSIS — F411 Generalized anxiety disorder: Secondary | ICD-10-CM | POA: Diagnosis not present

## 2017-12-08 DIAGNOSIS — F900 Attention-deficit hyperactivity disorder, predominantly inattentive type: Secondary | ICD-10-CM | POA: Diagnosis not present

## 2017-12-08 DIAGNOSIS — F331 Major depressive disorder, recurrent, moderate: Secondary | ICD-10-CM | POA: Diagnosis not present

## 2017-12-08 MED ORDER — AMPHETAMINE-DEXTROAMPHETAMINE 30 MG PO TABS: 30.0000 mg | ORAL_TABLET | Freq: Every day | ORAL | 0 refills | Status: DC

## 2017-12-08 MED ORDER — TOPIRAMATE 50 MG PO TABS: 25.0000 mg | ORAL_TABLET | Freq: Two times a day (BID) | ORAL | 1 refills | Status: DC

## 2017-12-08 MED ORDER — AMPHETAMINE-DEXTROAMPHETAMINE 30 MG PO TABS
30.0000 mg | ORAL_TABLET | Freq: Every day | ORAL | 0 refills | Status: DC
Start: 2018-01-05 — End: 2018-02-02

## 2017-12-08 MED ORDER — TOPIRAMATE 50 MG PO TABS: 25.0000 mg | ORAL_TABLET | Freq: Every day | ORAL | 2 refills | Status: DC

## 2017-12-08 MED ORDER — VENLAFAXINE HCL ER 75 MG PO CP24: 225.0000 mg | ORAL_CAPSULE | Freq: Every day | ORAL | 0 refills | Status: DC

## 2017-12-08 MED ORDER — ARIPIPRAZOLE 5 MG PO TABS: 5.0000 mg | ORAL_TABLET | Freq: Every day | ORAL | 0 refills | Status: DC

## 2017-12-08 NOTE — Patient Instructions (Signed)
Topiramate tablets What is this medicine? TOPIRAMATE (toe PYRE a mate) is used to treat seizures in adults or children with epilepsy. It is also used for the prevention of migraine headaches. This medicine may be used for other purposes; ask your health care provider or pharmacist if you have questions. COMMON BRAND NAME(S): Topamax, Topiragen What should I tell my health care provider before I take this medicine? They need to know if you have any of these conditions: -bleeding disorders -cirrhosis of the liver or liver disease -diarrhea -glaucoma -kidney stones or kidney disease -low blood counts, like low white cell, platelet, or red cell counts -lung disease like asthma, obstructive pulmonary disease, emphysema -metabolic acidosis -on a ketogenic diet -schedule for surgery or a procedure -suicidal thoughts, plans, or attempt; a previous suicide attempt by you or a family member -an unusual or allergic reaction to topiramate, other medicines, foods, dyes, or preservatives -pregnant or trying to get pregnant -breast-feeding How should I use this medicine? Take this medicine by mouth with a glass of water. Follow the directions on the prescription label. Do not crush or chew. You may take this medicine with meals. Take your medicine at regular intervals. Do not take it more often than directed. Talk to your pediatrician regarding the use of this medicine in children. Special care may be needed. While this drug may be prescribed for children as young as 2 years of age for selected conditions, precautions do apply. Overdosage: If you think you have taken too much of this medicine contact a poison control center or emergency room at once. NOTE: This medicine is only for you. Do not share this medicine with others. What if I miss a dose? If you miss a dose, take it as soon as you can. If your next dose is to be taken in less than 6 hours, then do not take the missed dose. Take the next dose at  your regular time. Do not take double or extra doses. What may interact with this medicine? Do not take this medicine with any of the following medications: -probenecid This medicine may also interact with the following medications: -acetazolamide -alcohol -amitriptyline -aspirin and aspirin-like medicines -birth control pills -certain medicines for depression -certain medicines for seizures -certain medicines that treat or prevent blood clots like warfarin, enoxaparin, dalteparin, apixaban, dabigatran, and rivaroxaban -digoxin -hydrochlorothiazide -lithium -medicines for pain, sleep, or muscle relaxation -metformin -methazolamide -NSAIDS, medicines for pain and inflammation, like ibuprofen or naproxen -pioglitazone -risperidone This list may not describe all possible interactions. Give your health care provider a list of all the medicines, herbs, non-prescription drugs, or dietary supplements you use. Also tell them if you smoke, drink alcohol, or use illegal drugs. Some items may interact with your medicine. What should I watch for while using this medicine? Visit your doctor or health care professional for regular checks on your progress. Do not stop taking this medicine suddenly. This increases the risk of seizures if you are using this medicine to control epilepsy. Wear a medical identification bracelet or chain to say you have epilepsy or seizures, and carry a card that lists all your medicines. This medicine can decrease sweating and increase your body temperature. Watch for signs of deceased sweating or fever, especially in children. Avoid extreme heat, hot baths, and saunas. Be careful about exercising, especially in hot weather. Contact your health care provider right away if you notice a fever or decrease in sweating. You should drink plenty of fluids while taking this medicine.   If you have had kidney stones in the past, this will help to reduce your chances of forming kidney  stones. If you have stomach pain, with nausea or vomiting and yellowing of your eyes or skin, call your doctor immediately. You may get drowsy, dizzy, or have blurred vision. Do not drive, use machinery, or do anything that needs mental alertness until you know how this medicine affects you. To reduce dizziness, do not sit or stand up quickly, especially if you are an older patient. Alcohol can increase drowsiness and dizziness. Avoid alcoholic drinks. If you notice blurred vision, eye pain, or other eye problems, seek medical attention at once for an eye exam. The use of this medicine may increase the chance of suicidal thoughts or actions. Pay special attention to how you are responding while on this medicine. Any worsening of mood, or thoughts of suicide or dying should be reported to your health care professional right away. This medicine may increase the chance of developing metabolic acidosis. If left untreated, this can cause kidney stones, bone disease, or slowed growth in children. Symptoms include breathing fast, fatigue, loss of appetite, irregular heartbeat, or loss of consciousness. Call your doctor immediately if you experience any of these side effects. Also, tell your doctor about any surgery you plan on having while taking this medicine since this may increase your risk for metabolic acidosis. Birth control pills may not work properly while you are taking this medicine. Talk to your doctor about using an extra method of birth control. Women who become pregnant while using this medicine may enroll in the [place] Antiepileptic Drug Pregnancy Registry by calling 1-888-233-2334. This registry collects information about the safety of antiepileptic drug use during pregnancy. What side effects may I notice from receiving this medicine? Side effects that you should report to your doctor or health care professional as soon as possible: -allergic reactions like skin rash, itching or hives,  swelling of the face, lips, or tongue -decreased sweating and/or rise in body temperature -depression -difficulty breathing, fast or irregular breathing patterns -difficulty speaking -difficulty walking or controlling muscle movements -hearing impairment -redness, blistering, peeling or loosening of the skin, including inside the mouth -tingling, pain or numbness in the hands or feet -unusual bleeding or bruising -unusually weak or tired -worsening of mood, thoughts or actions of suicide or dying Side effects that usually do not require medical attention (report to your doctor or health care professional if they continue or are bothersome): -altered taste -back pain, joint or muscle aches and pains -diarrhea, or constipation -headache -loss of appetite -nausea -stomach upset, indigestion -tremors This list may not describe all possible side effects. Call your doctor for medical advice about side effects. You may report side effects to FDA at 1-800-FDA-1088. Where should I keep my medicine? Keep out of the reach of children. Store at room temperature between 15 and 30 degrees C (59 and 86 degrees F) in a tightly closed container. Protect from moisture. Throw away any unused medicine after the expiration date. NOTE: This sheet is a summary. It may not cover all possible information. If you have questions about this medicine, talk to your doctor, pharmacist, or health care provider.  2018 [employer]vier/Gold Standard (2013-08-01 23:17:57)  

## 2017-12-08 NOTE — Progress Notes (Signed)
[facility] MD OP Progress Note  12/08/2017 2:34 PM [name] [name]  MRN:  [id number]  Chief Complaint: ' I am here for follow up.' Chief Complaint    Follow-up; Medication Refill; Weight Gain     HPI: [name] [name] is a 37 year old Caucasian female, divorced, lives in [place], is on SSD, has a history of depression, anxiety, ADD, GI issues like achalasia, presented to the clinic today for a follow-up visit.  Patient today reports she is compliant on her medications as prescribed.  She reports some improvement but she reports some mood symptoms on and off.    She reports sleep is good.    She reports she is tolerating Adderall as prescribed.  However reports she feels that the effect wears off . Discussed changing her dose today.  She agrees with plan.  She denies any side effects to any of these medications.  Patient however reports she is worried about her weight gain issues.  Patient since October has  gained at least 30 pounds.  She reports she has been watching her diet and exercising.  Patient however reports she continues to gain weight.  Discussed changing her ability  for concerns of weight gain.  Patient however reports Abilify as helpful for her mood and she is hesitant to do so.  Discussed with her about adding Topamax.  Patient agrees with plan. Visit Diagnosis:    ICD-10-CM   1. MDD (major depressive disorder), recurrent episode, moderate (HCC) F33.1 ARIPiprazole (ABILIFY) 5 MG tablet    venlafaxine XR (EFFEXOR XR) 75 MG 24 hr capsule  2. GAD (generalized anxiety disorder) F41.1 venlafaxine XR (EFFEXOR XR) 75 MG 24 hr capsule  3. Attention deficit hyperactivity disorder (ADHD), predominantly inattentive type F90.0     Past Psychiatric History: Past trials of medications like Cymbalta, Celexa, Wellbutrin, Lamictal, Adderall, Ritalin.  I have reviewed past psychiatric history from my progress note on 10/27/2017.  Past Medical History:  Past Medical History:  Diagnosis Date  . ADHD  (attention deficit hyperactivity disorder)   . Anxiety   . Depression     Past Surgical History:  Procedure Laterality Date  . ABDOMINAL SURGERY    . CHOLECYSTECTOMY    . ESOPHAGECTOMY      Family Psychiatric History: I have reviewed family psychiatric history from my progress note on 10/27/2017.  Family History:  Family History  Problem Relation Age of Onset  . Anxiety disorder Mother   . Anxiety disorder Father   . ADD / ADHD Brother   . Anxiety disorder Brother   . Anxiety disorder Maternal Grandmother    Substance abuse history: Denies  Social History: Born in [place], raised by parents.  Married and divorced twice to her high school sweetheart.  He is currently released from prison and is involved in her daughter's care.  Patient is currently dating her middle school sweetheart.  Patient reports the relationship is going well.  She has 2 daughters age 79 and 82 years old.  She is on SSD.  Patient social support from her aunt.  I have reviewed social history from my progress note on 10/27/2017. Social History   Socioeconomic History  . Marital status: Single    Spouse name: Not on file  . Number of children: Not on file  . Years of education: Not on file  . Highest education level: Not on file  Occupational History  . Not on file  Social Needs  . Financial resource strain: Not hard at all  . Food  insecurity:    Worry: Never true    Inability: Never true  . Transportation needs:    Medical: No    Non-medical: No  Tobacco Use  . Smoking status: Never Smoker  . Smokeless tobacco: Never Used  Substance and Sexual Activity  . Alcohol use: No  . Drug use: Yes    Types: Marijuana    Comment: last used 6 mths ago  . Sexual activity: Not Currently  Lifestyle  . Physical activity:    Days per week: 0 days    Minutes per session: 0 min  . Stress: Not on file  Relationships  . Social connections:    Talks on phone: Not on file    Gets together: Not on file    Attends  religious service: Never    Active member of club or organization: No    Attends meetings of clubs or organizations: Never    Relationship status: Not on file  Other Topics Concern  . Not on file  Social History Narrative  . Not on file    Allergies:  Allergies  Allergen Reactions  . Fentanyl Hives  . Metoclopramide Other (See Comments)  . Morphine Hives  . Butalbital-Apap-Caffeine Other (See Comments)    Paranoia  . Diazepam Other (See Comments)  . Penicillins   . Promethazine     seizures  . Latex Rash  . Lorazepam Other (See Comments)    combative    Metabolic Disorder Labs: No results found for: HGBA1C, MPG No results found for: PROLACTIN No results found for: CHOL, TRIG, HDL, CHOLHDL, VLDL, LDLCALC Lab Results  Component Value Date   TSH 0.733 06/19/2010   TSH 0.875 09/27/2009    Therapeutic Level Labs: No results found for: LITHIUM No results found for: VALPROATE No components found for:  CBMZ  Current Medications: Current Outpatient Medications  Medication Sig Dispense Refill  . ARIPiprazole (ABILIFY) 5 MG tablet Take 1 tablet (5 mg total) by mouth daily. 90 tablet 0  . doxepin (SINEQUAN) 10 MG capsule Take 1-2 capsules (10-20 mg total) by mouth at bedtime as needed. 180 capsule 0  . hydrOXYzine (VISTARIL) 25 MG capsule Take 1 capsule (25 mg total) by mouth 3 (three) times daily as needed for anxiety. 75 capsule 1  . ondansetron (ZOFRAN-ODT) 4 MG disintegrating tablet Take 8 mg by mouth.    . pantoprazole (PROTONIX) 20 MG tablet Take 40 mg by mouth.    . venlafaxine XR (EFFEXOR XR) 75 MG 24 hr capsule Take 3 capsules (225 mg total) by mouth daily with breakfast. 270 capsule 0  . amphetamine-dextroamphetamine (ADDERALL) 30 MG tablet Take 1 tablet by mouth daily. 30 tablet 0  . [START ON 01/05/2018] amphetamine-dextroamphetamine (ADDERALL) 30 MG tablet Take 1 tablet by mouth daily. 30 tablet 0  . topiramate (TOPAMAX) 50 MG tablet Take 0.5-1 tablets (25-50 mg  total) by mouth at bedtime. 30 tablet 2   No current facility-administered medications for this visit.      Musculoskeletal: Strength & Muscle Tone: within normal limits Gait & Station: normal Patient leans: N/A  Psychiatric Specialty Exam: Review of Systems  Psychiatric/Behavioral: The patient is nervous/anxious (improving).   All other systems reviewed and are negative.   Blood pressure 96/65, pulse (!) 114, temperature 98.3 F (36.8 C), temperature source Oral, weight 201 lb 12.8 oz (91.5 kg).Body mass index is 35.75 kg/m.  General Appearance: Casual  Eye Contact:  Fair  Speech:  Normal Rate  Volume:  Normal  Mood:  Euthymic  Affect:  Congruent  Thought Process:  Goal Directed and Descriptions of Associations: Intact  Orientation:  Full (Time, Place, and Person)  Thought Content: Logical   Suicidal Thoughts:  No  Homicidal Thoughts:  No  Memory:  Immediate;   Fair Recent;   Fair Remote;   Fair  Judgement:  Fair  Insight:  Fair  Psychomotor Activity:  Normal  Concentration:  Concentration: Fair and Attention Span: Fair  Recall:  [employer] of Knowledge: Fair  Language: Fair  Akathisia:  No  Handed:  Right  AIMS (if indicated):0  Assets:  Communication Skills Desire for Improvement Housing Intimacy Social Support Talents/Skills  ADL's:  Intact  Cognition: WNL  Sleep:  Fair   Screenings:   Assessment and Plan: [name] [name] is a 37 year old Caucasian female who has a history of depression, anxiety, ADD as well as esophageal achalasia, presented today for a follow-up visit.  Patient continues to struggle with some on and off attention issues as well as mood symptoms.  Discussed medication changes.  Patient also struggles with weight gain.  Plan as noted below.  Plan Anxiety disorder Continue Effexor XR 225 mg p.o. daily Continue hydroxyzine 25 mg p.o. 3 times daily as needed  For depression Continue Effexor XR 225 mg p.o. daily Continue Abilify 5 mg p.o.  Daily. Add Topamax 25-50 mg p.o. nightly.  For ADD Increase Adderall XR to 30 mg p.o. daily  For insomnia Continue doxepin 10-20 mg p.o. nightly as needed  Patient will continue intensive in-home therapy along with her daughters.  Will get the following labs due to her being on Abilify as well as recent weight gain-TSH, hemoglobin A1c, lipid panel, vitamin D, vitamin B12, folate.  Follow-up in clinic in 6 weeks or sooner if needed.  More than 50 % of the time was spent for psychoeducation and supportive psychotherapy and care coordination.  This note was generated in part or whole with voice recognition software. Voice recognition is usually quite accurate but there are transcription errors that can and very often do occur. I apologize for any typographical errors that were not detected and corrected.       Jomarie Longs, MD 12/08/2017, 10:20 PM

## 2018-01-04 ENCOUNTER — Other Ambulatory Visit: Payer: Self-pay | Admitting: Psychiatry

## 2018-01-04 DIAGNOSIS — F331 Major depressive disorder, recurrent, moderate: Secondary | ICD-10-CM

## 2018-01-04 DIAGNOSIS — F411 Generalized anxiety disorder: Secondary | ICD-10-CM

## 2018-01-04 DIAGNOSIS — F5105 Insomnia due to other mental disorder: Secondary | ICD-10-CM

## 2018-01-26 ENCOUNTER — Ambulatory Visit: Payer: Medicare Other | Admitting: Psychiatry

## 2018-02-02 ENCOUNTER — Encounter: Payer: Self-pay | Admitting: Psychiatry

## 2018-02-02 ENCOUNTER — Ambulatory Visit (INDEPENDENT_AMBULATORY_CARE_PROVIDER_SITE_OTHER): Payer: Medicare Other | Admitting: Psychiatry

## 2018-02-02 ENCOUNTER — Other Ambulatory Visit: Payer: Self-pay

## 2018-02-02 VITALS — BP 113/81 | HR 96 | Temp 97.7°F | Wt 201.6 lb

## 2018-02-02 DIAGNOSIS — F411 Generalized anxiety disorder: Secondary | ICD-10-CM

## 2018-02-02 DIAGNOSIS — F900 Attention-deficit hyperactivity disorder, predominantly inattentive type: Secondary | ICD-10-CM | POA: Diagnosis not present

## 2018-02-02 DIAGNOSIS — F5105 Insomnia due to other mental disorder: Secondary | ICD-10-CM | POA: Diagnosis not present

## 2018-02-02 DIAGNOSIS — F331 Major depressive disorder, recurrent, moderate: Secondary | ICD-10-CM

## 2018-02-02 MED ORDER — VENLAFAXINE HCL ER 75 MG PO CP24: 225.0000 mg | ORAL_CAPSULE | Freq: Every day | ORAL | 0 refills | Status: DC

## 2018-02-02 MED ORDER — TOPIRAMATE 50 MG PO TABS: 50.0000 mg | ORAL_TABLET | Freq: Every day | ORAL | 0 refills | Status: DC

## 2018-02-02 MED ORDER — AMPHETAMINE-DEXTROAMPHETAMINE 30 MG PO TABS: 30.0000 mg | ORAL_TABLET | Freq: Every day | ORAL | 0 refills | Status: DC

## 2018-02-02 MED ORDER — ARIPIPRAZOLE 10 MG PO TABS: 10.0000 mg | ORAL_TABLET | Freq: Every day | ORAL | 0 refills | Status: DC

## 2018-02-02 NOTE — Progress Notes (Signed)
BH MD OP Progress Note  02/02/2018 3:00 PM [name] [name] N [name]  MRN:  [id number]  Chief Complaint: ' I am here for follow up." Chief Complaint    Follow-up; Medication Refill     HPI: [name] [name] is a 37 year old Caucasian female, divorced, lives in [place], is on SSD, has a history of depression, anxiety, ADD, GI issues like achalasia, presented to the clinic today for a follow-up visit.  Patient today reports she recently had oral surgery done and is still recovering from the same.  She continues to be in pain and hence reports she feels tired.  Patient otherwise reports she is tolerating her medications for her depression and anxiety and ADHD well.  She denies any side effects to the medications.  She reports she had called the clinic a few weeks ago to discuss increasing the Abilify to 10 mg.  However discussed with patient that writer did not receive any messages left by patient.  Discussed with patient that it is okay to go up on the Abilify as long as she tolerates it well.  She does have a history of weight gain on Abilify.  She however reports since being on Topamax her weight gain problems are more under control.  She reports her appetite is under control and she does not eat too much anymore.  Patient continues to tolerate the Adderall well.  She denies any increased anxiety or agitation.  She reports sleep is good.  She reports the Topamax helps her to rest and she is not taking the doxepin for sleep anymore.  Patient denies any suicidality.  Patient denies any perceptual disturbances.  Patient reports good support system from her ex-husband, her aunt and also her boyfriend.   Visit Diagnosis:    ICD-10-CM   1. MDD (major depressive disorder), recurrent episode, moderate (HCC) F33.1 venlafaxine XR (EFFEXOR XR) 75 MG 24 hr capsule  2. GAD (generalized anxiety disorder) F41.1 venlafaxine XR (EFFEXOR XR) 75 MG 24 hr capsule  3. Attention deficit hyperactivity disorder (ADHD),  predominantly inattentive type F90.0   4. Insomnia due to mental disorder F51.05     Past Psychiatric History: Have reviewed past psychiatric history from my progress note on 10/27/2017.  Past trials of Cymbalta, Celexa, Wellbutrin, Lamictal, Adderall, Ritalin.  Past Medical History:  Past Medical History:  Diagnosis Date  . ADHD (attention deficit hyperactivity disorder)   . Anxiety   . Depression     Past Surgical History:  Procedure Laterality Date  . ABDOMINAL SURGERY    . CHOLECYSTECTOMY    . ESOPHAGECTOMY      Family Psychiatric History: Reviewed family psychiatric history from my progress note on 10/27/2017.  Family History:  Family History  Problem Relation Age of Onset  . Anxiety disorder Mother   . Anxiety disorder Father   . ADD / ADHD Brother   . Anxiety disorder Brother   . Anxiety disorder Maternal Grandmother    Substance abuse history: Denies  Social History: Have reviewed social history from my progress note on 10/27/2017. Social History   Socioeconomic History  . Marital status: Single    Spouse name: Not on file  . Number of children: Not on file  . Years of education: Not on file  . Highest education level: Not on file  Occupational History  . Not on file  Social Needs  . Financial resource strain: Not hard at all  . Food insecurity:    Worry: Never true    Inability: Never true  .  Transportation needs:    Medical: No    Non-medical: No  Tobacco Use  . Smoking status: Never Smoker  . Smokeless tobacco: Never Used  Substance and Sexual Activity  . Alcohol use: No  . Drug use: Yes    Types: Marijuana    Comment: last used 6 mths ago  . Sexual activity: Not Currently  Lifestyle  . Physical activity:    Days per week: 0 days    Minutes per session: 0 min  . Stress: Not on file  Relationships  . Social connections:    Talks on phone: Not on file    Gets together: Not on file    Attends religious service: Never    Active member of  club or organization: No    Attends meetings of clubs or organizations: Never    Relationship status: Not on file  Other Topics Concern  . Not on file  Social History Narrative  . Not on file    Allergies:  Allergies  Allergen Reactions  . Fentanyl Hives  . Metoclopramide Other (See Comments)  . Morphine Hives  . Butalbital-Apap-Caffeine Other (See Comments)    Paranoia  . Diazepam Other (See Comments)  . Penicillins   . Promethazine     seizures  . Latex Rash  . Lorazepam Other (See Comments)    combative    Metabolic Disorder Labs: No results found for: HGBA1C, MPG No results found for: PROLACTIN No results found for: CHOL, TRIG, HDL, CHOLHDL, VLDL, LDLCALC Lab Results  Component Value Date   TSH 0.733 06/19/2010   TSH 0.875 09/27/2009    Therapeutic Level Labs: No results found for: LITHIUM No results found for: VALPROATE No components found for:  CBMZ  Current Medications: Current Outpatient Medications  Medication Sig Dispense Refill  . [START ON 02/09/2018] amphetamine-dextroamphetamine (ADDERALL) 30 MG tablet Take 1 tablet by mouth daily. 30 tablet 0  . [START ON 03/10/2018] amphetamine-dextroamphetamine (ADDERALL) 30 MG tablet Take 1 tablet by mouth daily. 30 tablet 0  . hydrOXYzine (VISTARIL) 25 MG capsule Take 1 capsule (25 mg total) by mouth 3 (three) times daily as needed for anxiety. 75 capsule 1  . ondansetron (ZOFRAN-ODT) 4 MG disintegrating tablet Take 8 mg by mouth.    . pantoprazole (PROTONIX) 20 MG tablet Take 40 mg by mouth.    . topiramate (TOPAMAX) 50 MG tablet Take 1 tablet (50 mg total) by mouth at bedtime. 90 tablet 0  . venlafaxine XR (EFFEXOR XR) 75 MG 24 hr capsule Take 3 capsules (225 mg total) by mouth daily with breakfast. 270 capsule 0  . ARIPiprazole (ABILIFY) 10 MG tablet Take 1 tablet (10 mg total) by mouth daily. 90 tablet 0  . pantoprazole (PROTONIX) 40 MG tablet   0   No current facility-administered medications for this visit.       Musculoskeletal: Strength & Muscle Tone: within normal limits Gait & Station: normal Patient leans: N/A  Psychiatric Specialty Exam: Review of Systems  Psychiatric/Behavioral: Positive for depression (improving).  All other systems reviewed and are negative.   Blood pressure 113/81, pulse 96, temperature 97.7 F (36.5 C), temperature source Oral, weight 201 lb 9.6 oz (91.4 kg).Body mass index is 35.71 kg/m.  General Appearance: Casual  Eye Contact:  Fair  Speech:  Normal Rate  Volume:  Normal  Mood:  Dysphoric  Affect:  Congruent  Thought Process:  Goal Directed and Descriptions of Associations: Intact  Orientation:  Full (Time, Place, and Person)  Thought  Content: Logical   Suicidal Thoughts:  No  Homicidal Thoughts:  No  Memory:  Immediate;   Fair Recent;   Fair Remote;   Fair  Judgement:  Fair  Insight:  Fair  Psychomotor Activity:  Normal  Concentration:  Concentration: Fair and Attention Span: Fair  Recall:  [employer] of Knowledge: Fair  Language: Fair  Akathisia:  No  Handed:  Right  AIMS (if indicated): denies tremors, rigidity  Assets:  Communication Skills Desire for Improvement Social Support  ADL's:  Intact  Cognition: WNL  Sleep:  Fair   Screenings:   Assessment and Plan: Shaniqwa is a 37 year old Caucasian female who has a history of depression, anxiety, ADD, esophageal achalasia, presented to the clinic today for a follow-up visit.  Patient today reports she had oral surgery done and is currently in pain.  She otherwise denies any significant problems with her mood.  She is tolerating the medications well.  She reports she increased her Abilify herself and had called the clinic and left a message regarding the same.  She reports the higher dose is helpful.  Discussed plan as noted below.  Plan Anxiety disorder Continue Effexor XR 225 mg p.o. daily. Continue hydroxyzine 25 mg p.o. 3 times daily as needed  For depression Continue Effexor XR  225 mg p.o. daily. Continue Abilify at a higher dose of 10 mg p.o. daily Continue Topamax 50 mg p.o. Nightly  For ADD Continue Adderall XR 30 mg p.o. daily.  Provided with 2 scripts with date specified.  The last one to be filled on or after 03/10/2018. I have reviewed [place] controlled substance database.  For insomnia  Patient reports she does not need the doxepin anymore and is sleeping better.  Follow-up in clinic in 7-8 weeks.  More than 50 % of the time was spent for psychoeducation and supportive psychotherapy and care coordination.  This note was generated in part or whole with voice recognition software. Voice recognition is usually quite accurate but there are transcription errors that can and very often do occur. I apologize for any typographical errors that were not detected and corrected.         Jomarie Longs, MD 02/03/2018, 8:41 AM

## 2018-02-03 ENCOUNTER — Encounter: Payer: Self-pay | Admitting: Psychiatry

## 2018-04-07 ENCOUNTER — Ambulatory Visit: Payer: Medicare Other | Admitting: Psychiatry

## 2018-04-13 ENCOUNTER — Telehealth: Payer: Self-pay

## 2018-04-13 NOTE — Telephone Encounter (Signed)
pt left message on voicemail that she is out of adderall needs refills.  

## 2018-04-13 NOTE — Telephone Encounter (Signed)
She was called and given information. Pt states she was going to call pharmacy and if she has any problems she will call back.

## 2018-04-13 NOTE — Telephone Encounter (Signed)
ok 

## 2018-04-13 NOTE — Telephone Encounter (Signed)
Patient has a script which she never filled- to be filled on or after 03/10/2018 - pending at her pharmacy. pls let her know and I will see her for further medication management on 04/14/2018

## 2018-04-13 NOTE — Telephone Encounter (Signed)
pt left message on voicemail that she is out of adderall needs refills.

## 2018-04-14 ENCOUNTER — Ambulatory Visit (INDEPENDENT_AMBULATORY_CARE_PROVIDER_SITE_OTHER): Payer: Medicare Other | Admitting: Psychiatry

## 2018-04-14 ENCOUNTER — Encounter: Payer: Self-pay | Admitting: Psychiatry

## 2018-04-14 ENCOUNTER — Other Ambulatory Visit: Payer: Self-pay

## 2018-04-14 VITALS — BP 107/72 | HR 83 | Temp 97.7°F | Wt 199.0 lb

## 2018-04-14 DIAGNOSIS — F5105 Insomnia due to other mental disorder: Secondary | ICD-10-CM | POA: Diagnosis not present

## 2018-04-14 DIAGNOSIS — F411 Generalized anxiety disorder: Secondary | ICD-10-CM | POA: Diagnosis not present

## 2018-04-14 DIAGNOSIS — F900 Attention-deficit hyperactivity disorder, predominantly inattentive type: Secondary | ICD-10-CM | POA: Diagnosis not present

## 2018-04-14 DIAGNOSIS — F331 Major depressive disorder, recurrent, moderate: Secondary | ICD-10-CM

## 2018-04-14 MED ORDER — TOPIRAMATE 50 MG PO TABS: 75.0000 mg | ORAL_TABLET | Freq: Every day | ORAL | 0 refills | Status: DC

## 2018-04-14 MED ORDER — ARIPIPRAZOLE 10 MG PO TABS: 15.0000 mg | ORAL_TABLET | Freq: Every day | ORAL | 0 refills | Status: DC

## 2018-04-14 MED ORDER — VENLAFAXINE HCL ER 75 MG PO CP24: 225.0000 mg | ORAL_CAPSULE | Freq: Every day | ORAL | 0 refills | Status: DC

## 2018-04-14 NOTE — Progress Notes (Signed)
BH MD OP Progress Note  04/14/2018 3:29 PM [name] [name]  MRN:  [id number]  Chief Complaint: ' I am here for follow up.' Chief Complaint    Follow-up; Medication Refill     HPI: [name] [name] is a 37 y old Caucasian female, divorced, lives in [place], is on SSD, has a history of depression, anxiety, ADD, GI issues like achalasia, presented to the clinic today for a follow-up visit.  Patient today reports she continues to have some mood lability.  She reports she has been compliant with her Abilify and that seems to be very helpful.  She however would like her dosage to be readjusted today to help with her mood symptoms.  She continues to be compliant with her Effexor Topamax and her ADHD medications.  Patient reports sleep is fair.  Patient reports her focus and attention is good.  She denies any suicidality.  She denies any perceptual disturbances.  She denies any homicidality.  She reports she has managed to lose a couple of pounds and has been trying to exercise and watch her diet.  She continues to have good support system from her boyfriend.  She denies any other concerns today. Visit Diagnosis:    ICD-10-CM   1. MDD (major depressive disorder), recurrent episode, moderate (HCC) F33.1 ARIPiprazole (ABILIFY) 10 MG tablet    venlafaxine XR (EFFEXOR XR) 75 MG 24 hr capsule    topiramate (TOPAMAX) 50 MG tablet  2. GAD (generalized anxiety disorder) F41.1 ARIPiprazole (ABILIFY) 10 MG tablet    venlafaxine XR (EFFEXOR XR) 75 MG 24 hr capsule    topiramate (TOPAMAX) 50 MG tablet  3. Attention deficit hyperactivity disorder (ADHD), predominantly inattentive type F90.0   4. Insomnia due to mental disorder F51.05     Past Psychiatric History: Hx of ADD, bipolar disorder, MDD, GAD.  History of IV admission for mental illness in the past.  Denies suicide attempts.  Has tried medications like Cymbalta, Celexa, Wellbutrin, Lamictal, Adderall, Ritalin.  Past Medical History:  Past Medical  History:  Diagnosis Date  . ADHD (attention deficit hyperactivity disorder)   . Anxiety   . Depression     Past Surgical History:  Procedure Laterality Date  . ABDOMINAL SURGERY    . CHOLECYSTECTOMY    . ESOPHAGECTOMY      Family Psychiatric History: Father, mother-anger issues, fatigue, depression , daughter-depression  Family History:  Family History  Problem Relation Age of Onset  . Anxiety disorder Mother   . Anxiety disorder Father   . ADD / ADHD Brother   . Anxiety disorder Brother   . Anxiety disorder Maternal Grandmother    Substance abuse history: Denies  Social History: I have reviewed social history and my progress note on 09/30/2017 Social History   Socioeconomic History  . Marital status: Single    Spouse name: Not on file  . Number of children: Not on file  . Years of education: Not on file  . Highest education level: Not on file  Occupational History  . Not on file  Social Needs  . Financial resource strain: Not hard at all  . Food insecurity:    Worry: Never true    Inability: Never true  . Transportation needs:    Medical: No    Non-medical: No  Tobacco Use  . Smoking status: Never Smoker  . Smokeless tobacco: Never Used  Substance and Sexual Activity  . Alcohol use: No  . Drug use: Yes    Types: Marijuana  Comment: last used 6 mths ago  . Sexual activity: Not Currently  Lifestyle  . Physical activity:    Days per week: 0 days    Minutes per session: 0 min  . Stress: Not on file  Relationships  . Social connections:    Talks on phone: Not on file    Gets together: Not on file    Attends religious service: Never    Active member of club or organization: No    Attends meetings of clubs or organizations: Never    Relationship status: Not on file  Other Topics Concern  . Not on file  Social History Narrative  . Not on file    Allergies:  Allergies  Allergen Reactions  . Fentanyl Hives  . Metoclopramide Other (See Comments)   . Morphine Hives  . Butalbital-Apap-Caffeine Other (See Comments)    Paranoia  . Diazepam Other (See Comments)  . Penicillins   . Promethazine     seizures  . Latex Rash  . Lorazepam Other (See Comments)    combative    Metabolic Disorder Labs: No results found for: HGBA1C, MPG No results found for: PROLACTIN No results found for: CHOL, TRIG, HDL, CHOLHDL, VLDL, LDLCALC Lab Results  Component Value Date   TSH 0.733 06/19/2010   TSH 0.875 09/27/2009    Therapeutic Level Labs: No results found for: LITHIUM No results found for: VALPROATE No components found for:  CBMZ  Current Medications: Current Outpatient Medications  Medication Sig Dispense Refill  . amphetamine-dextroamphetamine (ADDERALL) 30 MG tablet Take 1 tablet by mouth daily. 30 tablet 0  . amphetamine-dextroamphetamine (ADDERALL) 30 MG tablet Take 1 tablet by mouth daily. 30 tablet 0  . ARIPiprazole (ABILIFY) 10 MG tablet Take 1.5 tablets (15 mg total) by mouth daily. 135 tablet 0  . hydrOXYzine (VISTARIL) 25 MG capsule Take 1 capsule (25 mg total) by mouth 3 (three) times daily as needed for anxiety. 75 capsule 1  . ondansetron (ZOFRAN-ODT) 4 MG disintegrating tablet Take 8 mg by mouth.    . pantoprazole (PROTONIX) 20 MG tablet Take 40 mg by mouth.    . pantoprazole (PROTONIX) 40 MG tablet   0  . topiramate (TOPAMAX) 50 MG tablet Take 1.5 tablets (75 mg total) by mouth at bedtime. 135 tablet 0  . venlafaxine XR (EFFEXOR XR) 75 MG 24 hr capsule Take 3 capsules (225 mg total) by mouth daily with breakfast. 270 capsule 0   No current facility-administered medications for this visit.      Musculoskeletal: Strength & Muscle Tone: within normal limits Gait & Station: normal Patient leans: N/A  Psychiatric Specialty Exam: Review of Systems  Psychiatric/Behavioral: Positive for depression. The patient is nervous/anxious.   All other systems reviewed and are negative.   Blood pressure 107/72, pulse 83,  temperature 97.7 F (36.5 C), temperature source Oral, weight 199 lb (90.3 kg).Body mass index is 35.25 kg/m.  General Appearance: Casual  Eye Contact:  Fair  Speech:  Clear and Coherent  Volume:  Normal  Mood:  Dysphoric  Affect:  Congruent  Thought Process:  Goal Directed and Descriptions of Associations: Intact  Orientation:  Full (Time, Place, and Person)  Thought Content: Logical   Suicidal Thoughts:  No  Homicidal Thoughts:  No  Memory:  Immediate;   Fair Recent;   Fair Remote;   Fair  Judgement:  Fair  Insight:  Fair  Psychomotor Activity:  Normal  Concentration:  Concentration: Fair and Attention Span: Fair  Recall:  Fair  [employer] of Knowledge: Fair  Language: Fair  Akathisia:  No  Handed:  Right  AIMS (if indicated): denies tremors , rigidity,stiffness  Assets:  Communication Skills Desire for Improvement Social Support  ADL's:  Intact  Cognition: WNL  Sleep:  Fair   Screenings:   Assessment and Plan: [name] [name] is a 37 year old Caucasian female who has a history of depression, anxiety, ADD, esophageal achalasia, presented to the clinic today for a follow-up visit.  Patient continues to have some mood symptoms and hence discussed medication readjustment.  She also has some weight gain side effects from the Abilify.  Plan as noted below.  Plan Anxiety disorder Continue Effexor XR  225 mg p.o. daily Continue hydroxyzine 25 mg p.o. 3 times daily as needed.  Depression Continue Effexor XR 225 mg p.o. daily Continue Abilify, will increase to 15 mg p.o. daily Increase Topamax to 75 mg p.o. Nightly  For ADD Continue Adderall XR 30 mg p.o. daily. Patient picked up a prescription today from the pharmacy.  I have reviewed [place] controlled substance database.  For insomnia Patient reports sleep is improved.  Follow-up in clinic in 1 month or sooner if needed.  More than 50 % of the time was spent for psychoeducation and supportive psychotherapy and care  coordination.  This note was generated in part or whole with voice recognition software. Voice recognition is usually quite accurate but there are transcription errors that can and very often do occur. I apologize for any typographical errors that were not detected and corrected.           Jomarie Longs, MD 04/14/2018, 3:29 PM

## 2018-04-14 NOTE — Patient Instructions (Signed)

## 2018-05-13 ENCOUNTER — Ambulatory Visit (INDEPENDENT_AMBULATORY_CARE_PROVIDER_SITE_OTHER): Payer: Medicare Other | Admitting: Psychiatry

## 2018-05-13 ENCOUNTER — Encounter: Payer: Self-pay | Admitting: Psychiatry

## 2018-05-13 ENCOUNTER — Other Ambulatory Visit: Payer: Self-pay

## 2018-05-13 VITALS — BP 109/73 | HR 73 | Temp 98.8°F | Ht 63.0 in | Wt 202.4 lb

## 2018-05-13 DIAGNOSIS — F331 Major depressive disorder, recurrent, moderate: Secondary | ICD-10-CM | POA: Diagnosis not present

## 2018-05-13 DIAGNOSIS — F900 Attention-deficit hyperactivity disorder, predominantly inattentive type: Secondary | ICD-10-CM

## 2018-05-13 DIAGNOSIS — F411 Generalized anxiety disorder: Secondary | ICD-10-CM

## 2018-05-13 DIAGNOSIS — R635 Abnormal weight gain: Secondary | ICD-10-CM | POA: Diagnosis not present

## 2018-05-13 DIAGNOSIS — T50905A Adverse effect of unspecified drugs, medicaments and biological substances, initial encounter: Secondary | ICD-10-CM

## 2018-05-13 MED ORDER — METFORMIN HCL 500 MG PO TABS: 500.0000 mg | ORAL_TABLET | Freq: Every day | ORAL | 0 refills | Status: DC

## 2018-05-13 MED ORDER — AMPHETAMINE-DEXTROAMPHETAMINE 30 MG PO TABS: 30.0000 mg | ORAL_TABLET | Freq: Every day | ORAL | 0 refills | Status: DC

## 2018-05-13 NOTE — Patient Instructions (Signed)
Calorie Counting for Weight Loss Calories are units of energy. Your body needs a certain amount of calories from food to keep you going throughout the day. When you eat more calories than your body needs, your body stores the extra calories as fat. When you eat fewer calories than your body needs, your body burns fat to get the energy it needs. Calorie counting means keeping track of how many calories you eat and drink each day. Calorie counting can be helpful if you need to lose weight. If you make sure to eat fewer calories than your body needs, you should lose weight. Ask your health care provider what a healthy weight is for you. For calorie counting to work, you will need to eat the right number of calories in a day in order to lose a healthy amount of weight per week. A dietitian can help you determine how many calories you need in a day and will give you suggestions on how to reach your calorie goal.  A healthy amount of weight to lose per week is usually 1-2 lb (0.5-0.9 kg). This usually means that your daily calorie intake should be reduced by 500-750 calories.  Eating 1,200 - 1,500 calories per day can help most women lose weight.  Eating 1,500 - 1,800 calories per day can help most men lose weight.  What is my plan? My goal is to have __________ calories per day. If I have this many calories per day, I should lose around __________ pounds per week. What do I need to know about calorie counting? In order to meet your daily calorie goal, you will need to:  Find out how many calories are in each food you would like to eat. Try to do this before you eat.  Decide how much of the food you plan to eat.  Write down what you ate and how many calories it had. Doing this is called keeping a food log.  To successfully lose weight, it is important to balance calorie counting with a healthy lifestyle that includes regular activity. Aim for 150 minutes of moderate exercise (such as walking) or 75  minutes of vigorous exercise (such as running) each week. Where do I find calorie information?  The number of calories in a food can be found on a Nutrition Facts label. If a food does not have a Nutrition Facts label, try to look up the calories online or ask your dietitian for help. Remember that calories are listed per serving. If you choose to have more than one serving of a food, you will have to multiply the calories per serving by the amount of servings you plan to eat. For example, the label on a package of bread might say that a serving size is 1 slice and that there are 90 calories in a serving. If you eat 1 slice, you will have eaten 90 calories. If you eat 2 slices, you will have eaten 180 calories. How do I keep a food log? Immediately after each meal, record the following information in your food log:  What you ate. Don't forget to include toppings, sauces, and other extras on the food.  How much you ate. This can be measured in cups, ounces, or number of items.  How many calories each food and drink had.  The total number of calories in the meal.  Keep your food log near you, such as in a small notebook in your pocket, or use a mobile app or website. Some  programs will calculate calories for you and show you how many calories you have left for the day to meet your goal. What are some calorie counting tips?  Use your calories on foods and drinks that will fill you up and not leave you hungry: ? Some examples of foods that fill you up are nuts and nut butters, vegetables, lean proteins, and high-fiber foods like whole grains. High-fiber foods are foods with more than 5 g fiber per serving. ? Drinks such as sodas, specialty coffee drinks, alcohol, and juices have a lot of calories, yet do not fill you up.  Eat nutritious foods and avoid empty calories. Empty calories are calories you get from foods or beverages that do not have many vitamins or protein, such as candy, sweets, and  soda. It is better to have a nutritious high-calorie food (such as an avocado) than a food with few nutrients (such as a bag of chips).  Know how many calories are in the foods you eat most often. This will help you calculate calorie counts faster.  Pay attention to calories in drinks. Low-calorie drinks include water and unsweetened drinks.  Pay attention to nutrition labels for "low fat" or "fat free" foods. These foods sometimes have the same amount of calories or more calories than the full fat versions. They also often have added sugar, starch, or salt, to make up for flavor that was removed with the fat.  Find a way of tracking calories that works for you. Get creative. Try different apps or programs if writing down calories does not work for you. What are some portion control tips?  Know how many calories are in a serving. This will help you know how many servings of a certain food you can have.  Use a measuring cup to measure serving sizes. You could also try weighing out portions on a kitchen scale. With time, you will be able to estimate serving sizes for some foods.  Take some time to put servings of different foods on your favorite plates, bowls, and cups so you know what a serving looks like.  Try not to eat straight from a bag or box. Doing this can lead to overeating. Put the amount you would like to eat in a cup or on a plate to make sure you are eating the right portion.  Use smaller plates, glasses, and bowls to prevent overeating.  Try not to multitask (for example, watch TV or use your computer) while eating. If it is time to eat, sit down at a table and enjoy your food. This will help you to know when you are full. It will also help you to be aware of what you are eating and how much you are eating. What are tips for following this plan? Reading food labels  Check the calorie count compared to the serving size. The serving size may be smaller than what you are used to  eating.  Check the source of the calories. Make sure the food you are eating is high in vitamins and protein and low in saturated and trans fats. Shopping  Read nutrition labels while you shop. This will help you make healthy decisions before you decide to purchase your food.  Make a grocery list and stick to it. Cooking  Try to cook your favorite foods in a healthier way. For example, try baking instead of frying.  Use low-fat dairy products. Meal planning  Use more fruits and vegetables. Half of your plate should   be fruits and vegetables.  Include lean proteins like poultry and fish. How do I count calories when eating out?  Ask for smaller portion sizes.  Consider sharing an entree and sides instead of getting your own entree.  If you get your own entree, eat only half. Ask for a box at the beginning of your meal and put the rest of your entree in it so you are not tempted to eat it.  If calories are listed on the menu, choose the lower calorie options.  Choose dishes that include vegetables, fruits, whole grains, low-fat dairy products, and lean protein.  Choose items that are boiled, broiled, grilled, or steamed. Stay away from items that are buttered, battered, fried, or served with cream sauce. Items labeled "crispy" are usually fried, unless stated otherwise.  Choose water, low-fat milk, unsweetened iced tea, or other drinks without added sugar. If you want an alcoholic beverage, choose a lower calorie option such as a glass of wine or light beer.  Ask for dressings, sauces, and syrups on the side. These are usually high in calories, so you should limit the amount you eat.  If you want a salad, choose a garden salad and ask for grilled meats. Avoid extra toppings like bacon, cheese, or fried items. Ask for the dressing on the side, or ask for olive oil and vinegar or lemon to use as dressing.  Estimate how many servings of a food you are given. For example, a serving of  cooked rice is  cup or about the size of half a baseball. Knowing serving sizes will help you be aware of how much food you are eating at restaurants. The list below tells you how big or small some common portion sizes are based on everyday objects: ? 1 oz-4 stacked dice. ? 3 oz-1 deck of cards. ? 1 tsp-1 die. ? 1 Tbsp- a ping-pong ball. ? 2 Tbsp-1 ping-pong ball. ?  cup- baseball. ? 1 cup-1 baseball. Summary  Calorie counting means keeping track of how many calories you eat and drink each day. If you eat fewer calories than your body needs, you should lose weight.  A healthy amount of weight to lose per week is usually 1-2 lb (0.5-0.9 kg). This usually means reducing your daily calorie intake by 500-750 calories.  The number of calories in a food can be found on a Nutrition Facts label. If a food does not have a Nutrition Facts label, try to look up the calories online or ask your dietitian for help.  Use your calories on foods and drinks that will fill you up, and not on foods and drinks that will leave you hungry.  Use smaller plates, glasses, and bowls to prevent overeating. This information is not intended to replace advice given to you by your health care provider. Make sure you discuss any questions you have with your health care provider. Document Released: 07/28/2005 Document Revised: 06/27/2016 Document Reviewed: 06/27/2016 Elsevier Interactive Patient Education  2018 [employer]. Metformin tablets What is this medicine? METFORMIN (met FOR min) is used to treat type 2 diabetes. It helps to control blood sugar. Treatment is combined with diet and exercise. This medicine can be used alone or with other medicines for diabetes. This medicine may be used for other purposes; ask your health care provider or pharmacist if you have questions. COMMON BRAND NAME(S): Glucophage What should I tell my health care provider before I take this medicine? They need to know if you have any  of these conditions: -anemia -dehydration -heart disease -frequently drink alcohol-containing beverages -kidney disease -liver disease -polycystic ovary syndrome -serious infection or injury -vomiting -an unusual or allergic reaction to metformin, other medicines, foods, dyes, or preservatives -pregnant or trying to get pregnant -breast-feeding How should I use this medicine? Take this medicine by mouth with a glass of water. Follow the directions on the prescription label. Take this medicine with food. Take your medicine at regular intervals. Do not take your medicine more often than directed. Do not stop taking except on your doctor's advice. Talk to your pediatrician regarding the use of this medicine in children. While this drug may be prescribed for children as young as 65 years of age for selected conditions, precautions do apply. Overdosage: If you think you have taken too much of this medicine contact a poison control center or emergency room at once. NOTE: This medicine is only for you. Do not share this medicine with others. What if I miss a dose? If you miss a dose, take it as soon as you can. If it is almost time for your next dose, take only that dose. Do not take double or extra doses. What may interact with this medicine? Do not take this medicine with any of the following medications: -dofetilide -certain contrast medicines given before X-rays, CT scans, MRI, or other procedures This medicine may also interact with the following medications: -acetazolamide -certain antiviral medicines for HIV or AIDS or for hepatitis, like adefovir, dolutegravir, emtricitabine, entecavir, lamivudine, paritaprevir, or tenofovir -cimetidine -cobicistat -crizotinib -dichlorphenamide -digoxin -diuretics -female hormones, like estrogens or progestins and birth control pills -glycopyrrolate -isoniazid -lamotrigine -medicines for blood pressure, heart disease, irregular heart  beat -memantine -midodrine -methazolamide -morphine -niacin -phenothiazines like chlorpromazine, mesoridazine, prochlorperazine, thioridazine -phenytoin -procainamide -propantheline -quinidine -quinine -ranitidine -ranolazine -steroid medicines like prednisone or cortisone -stimulant medicines for attention disorders, weight loss, or to stay awake -thyroid medicines -topiramate -trimethoprim -trospium -vancomycin -vandetanib -zonisamide This list may not describe all possible interactions. Give your health care provider a list of all the medicines, herbs, non-prescription drugs, or dietary supplements you use. Also tell them if you smoke, drink alcohol, or use illegal drugs. Some items may interact with your medicine. What should I watch for while using this medicine? Visit your doctor or health care professional for regular checks on your progress. A test called the HbA1C (A1C) will be monitored. This is a simple blood test. It measures your blood sugar control over the last 2 to 3 months. You will receive this test every 3 to 6 months. Learn how to check your blood sugar. Learn the symptoms of low and high blood sugar and how to manage them. Always carry a quick-source of sugar with you in case you have symptoms of low blood sugar. Examples include hard sugar candy or glucose tablets. Make sure others know that you can choke if you eat or drink when you develop serious symptoms of low blood sugar, such as seizures or unconsciousness. They must get medical help at once. Tell your doctor or health care professional if you have high blood sugar. You might need to change the dose of your medicine. If you are sick or exercising more than usual, you might need to change the dose of your medicine. Do not skip meals. Ask your doctor or health care professional if you should avoid alcohol. Many nonprescription cough and cold products contain sugar or alcohol. These can affect blood  sugar. This medicine may cause ovulation in premenopausal  women who do not have regular monthly periods. This may increase your chances of becoming pregnant. You should not take this medicine if you become pregnant or think you may be pregnant. Talk with your doctor or health care professional about your birth control options while taking this medicine. Contact your doctor or health care professional right away if think you are pregnant. If you are going to need surgery, a MRI, CT scan, or other procedure, tell your doctor that you are taking this medicine. You may need to stop taking this medicine before the procedure. Wear a medical ID bracelet or chain, and carry a card that describes your disease and details of your medicine and dosage times. What side effects may I notice from receiving this medicine? Side effects that you should report to your doctor or health care professional as soon as possible: -allergic reactions like skin rash, itching or hives, swelling of the face, lips, or tongue -breathing problems -feeling faint or lightheaded, falls -muscle aches or pains -signs and symptoms of low blood sugar such as feeling anxious, confusion, dizziness, increased hunger, unusually weak or tired, sweating, shakiness, cold, irritable, headache, blurred vision, fast heartbeat, loss of consciousness -slow or irregular heartbeat -unusual stomach pain or discomfort -unusually tired or weak Side effects that usually do not require medical attention (report to your doctor or health care professional if they continue or are bothersome): -diarrhea -headache -heartburn -metallic taste in mouth -nausea -stomach gas, upset This list may not describe all possible side effects. Call your doctor for medical advice about side effects. You may report side effects to FDA at 1-800-FDA-1088. Where should I keep my medicine? Keep out of the reach of children. Store at room temperature between 15 and 30 degrees  C (59 and 86 degrees F). Protect from moisture and light. Throw away any unused medicine after the expiration date. NOTE: This sheet is a summary. It may not cover all possible information. If you have questions about this medicine, talk to your doctor, pharmacist, or health care provider.  2018 Elsevier/[employer] Standard (2016-02-06 15:34:19)

## 2018-05-13 NOTE — Progress Notes (Signed)
BH MD OP Progress Note  05/13/2018 11:51 AM [name] [name]  MRN:  [id number]  Chief Complaint: ' I am here for follow up." Chief Complaint    Follow-up; Medication Refill     HPI: [name] [name] is a 37 yr old Caucasian female divorced, lives in [place], on [place], has a history of depression, anxiety, ADD, GI issues like achalasia, presented to the clinic today for a follow-up visit.  Patient today reports her mood symptoms as stable.  She denies any significant depressive symptoms or anxiety symptoms.  She is compliant on her Effexor Topamax and Abilify.  Continues to struggle with weight gain.  Her weight gain is likely due to Abilify.  Patient wants to stay on this medication since it is keeping her mood symptoms stable.  However patient has gained at least 30 pounds since the past 10-12 months or so.  Discussed adding metformin.  Also discussed joining the gym, calorie counting, joining a weight loss program and so on.  Patient agrees with plan.  Patient continues to be on Adderall 30 mg.  Patient denies any side effects.  She reports sleep as fair.  Denies any appetite suppression from the Adderall.  I have reviewed [place] controlled substance database.  Patient denies any suicidality or perceptual disturbances.  Patient continues to have good social support system. Visit Diagnosis:    ICD-10-CM   1. MDD (major depressive disorder), recurrent episode, moderate (HCC) F33.1   2. GAD (generalized anxiety disorder) F41.1   3. Attention deficit hyperactivity disorder (ADHD), predominantly inattentive type F90.0 amphetamine-dextroamphetamine (ADDERALL) 30 MG tablet    amphetamine-dextroamphetamine (ADDERALL) 30 MG tablet  4. Weight gain due to medication R63.5 metFORMIN (GLUCOPHAGE) 500 MG tablet   T50.905A     Past Psychiatric History: Have reviewed past psychiatric history from my progress note on 04/14/2018.  Past trials of Cymbalta, Celexa, Wellbutrin, Lamictal, Adderall, Ritalin  Past Medical  History:  Past Medical History:  Diagnosis Date  . ADHD (attention deficit hyperactivity disorder)   . Anxiety   . Depression     Past Surgical History:  Procedure Laterality Date  . ABDOMINAL SURGERY    . CHOLECYSTECTOMY    . ESOPHAGECTOMY      Family Psychiatric History: Reviewed family psychiatric history from my progress note on 04/14/2018  Family History:  Family History  Problem Relation Age of Onset  . Anxiety disorder Mother   . Anxiety disorder Father   . ADD / ADHD Brother   . Anxiety disorder Brother   . Anxiety disorder Maternal Grandmother     Social History: Reviewed social history from my progress note on 09/30/2017 Social History   Socioeconomic History  . Marital status: Single    Spouse name: Not on file  . Number of children: Not on file  . Years of education: Not on file  . Highest education level: Not on file  Occupational History  . Not on file  Social Needs  . Financial resource strain: Not hard at all  . Food insecurity:    Worry: Never true    Inability: Never true  . Transportation needs:    Medical: No    Non-medical: No  Tobacco Use  . Smoking status: Never Smoker  . Smokeless tobacco: Never Used  Substance and Sexual Activity  . Alcohol use: No  . Drug use: Yes    Types: Marijuana    Comment: last used 6 mths ago  . Sexual activity: Not Currently  Lifestyle  . Physical  activity:    Days per week: 0 days    Minutes per session: 0 min  . Stress: Not on file  Relationships  . Social connections:    Talks on phone: Not on file    Gets together: Not on file    Attends religious service: Never    Active member of club or organization: No    Attends meetings of clubs or organizations: Never    Relationship status: Not on file  Other Topics Concern  . Not on file  Social History Narrative  . Not on file    Allergies:  Allergies  Allergen Reactions  . Fentanyl Hives  . Metoclopramide Other (See Comments)  . Morphine Hives   . Butalbital-Apap-Caffeine Other (See Comments)    Paranoia  . Diazepam Other (See Comments)  . Penicillins   . Promethazine     seizures  . Latex Rash  . Lorazepam Other (See Comments)    combative    Metabolic Disorder Labs: No results found for: HGBA1C, MPG No results found for: PROLACTIN No results found for: CHOL, TRIG, HDL, CHOLHDL, VLDL, LDLCALC Lab Results  Component Value Date   TSH 0.733 06/19/2010   TSH 0.875 09/27/2009    Therapeutic Level Labs: No results found for: LITHIUM No results found for: VALPROATE No components found for:  CBMZ  Current Medications: Current Outpatient Medications  Medication Sig Dispense Refill  . amphetamine-dextroamphetamine (ADDERALL) 30 MG tablet Take 1 tablet by mouth daily. 30 tablet 0  . [START ON 06/11/2018] amphetamine-dextroamphetamine (ADDERALL) 30 MG tablet Take 1 tablet by mouth daily. 30 tablet 0  . hydrOXYzine (VISTARIL) 25 MG capsule Take 1 capsule (25 mg total) by mouth 3 (three) times daily as needed for anxiety. 75 capsule 1  . ondansetron (ZOFRAN-ODT) 4 MG disintegrating tablet Take 8 mg by mouth.    . pantoprazole (PROTONIX) 20 MG tablet Take 40 mg by mouth.    . pantoprazole (PROTONIX) 40 MG tablet   0  . topiramate (TOPAMAX) 50 MG tablet Take 1.5 tablets (75 mg total) by mouth at bedtime. 135 tablet 0  . venlafaxine XR (EFFEXOR XR) 75 MG 24 hr capsule Take 3 capsules (225 mg total) by mouth daily with breakfast. 270 capsule 0  . ARIPiprazole (ABILIFY) 15 MG tablet 15 mg.  0  . metFORMIN (GLUCOPHAGE) 500 MG tablet Take 1 tablet (500 mg total) by mouth daily with supper. 90 tablet 0  . sucralfate (CARAFATE) 1 g tablet Take 1 g by mouth 4 (four) times daily.  0   No current facility-administered medications for this visit.      Musculoskeletal: Strength & Muscle Tone: within normal limits Gait & Station: normal Patient leans: N/A  Psychiatric Specialty Exam: Review of Systems  Psychiatric/Behavioral: The  patient is nervous/anxious.   All other systems reviewed and are negative.   Blood pressure 109/73, pulse 73, temperature 98.8 F (37.1 C), temperature source Oral, height 5\' 3"  (1.6 m), weight 202 lb 6.4 oz (91.8 kg), last menstrual period 04/13/2018.Body mass index is 35.85 kg/m.  General Appearance: Casual  Eye Contact:  Fair  Speech:  Normal Rate  Volume:  Normal  Mood:  Anxious  Affect:  Appropriate  Thought Process:  Goal Directed and Descriptions of Associations: Intact  Orientation:  Full (Time, Place, and Person)  Thought Content: Logical   Suicidal Thoughts:  No  Homicidal Thoughts:  No  Memory:  Immediate;   Fair Recent;   Fair Remote;   Fair  Judgement:  Fair  Insight:  Fair  Psychomotor Activity:  Normal  Concentration:  Concentration: Fair and Attention Span: Fair  Recall:  [employer] of Knowledge: Fair  Language: Fair  Akathisia:  No  Handed:  Right  AIMS (if indicated): denies tremors, rigidity,stiffness  Assets:  Communication Skills Desire for Improvement Social Support  ADL's:  Intact  Cognition: WNL  Sleep:  Fair   Screenings:   Assessment and Plan: Mariabella is a 37 year old Caucasian female who has a history of depression, anxiety, ADD, esophageal achalasia, presented to the clinic today for a follow-up visit.  Patient today reports her mood symptoms are stable.  She continues to have weight gain side effects from the Abilify however reports she wants to stay on the Abilify since it has been keeping her mood symptoms stable.  She denies any suicidality or perceptual disturbances.  Will continue plan as noted below.  Plan for depression Continue Effexor XR 225 mg p.o. daily Continue Abilify 15 mg p.o. daily Topamax 75 mg p.o. nightly  For anxiety disorder Effexor XR 225 mg p.o. Daily Hydroxyzine 25 mg p.o. 3 times daily as needed  For ADD Continue Adderall 30 mg p.o. daily.  I have reviewed [place] controlled substance database. I have provided her  with 2 prescriptions with date specified the last one to be filled on or after 06/11/2018.  For weight gain due to antipsychotic therapy. Discussed calorie counting, exercise, joining a weight loss program with patient. We will start metformin 500 mg p.o. daily.  Will order the following labs-vitamin B12, TSH, CBC, CMP, lipid panel, prolactin, hemoglobin A1c.  Follow-up in clinic in 4 weeks or sooner if needed.  More than 50 % of the time was spent for psychoeducation and supportive psychotherapy and care coordination.  This note was generated in part or whole with voice recognition software. Voice recognition is usually quite accurate but there are transcription errors that can and very often do occur. I apologize for any typographical errors that were not detected and corrected.       Jomarie Longs, MD 05/13/2018, 11:51 AM

## 2018-07-07 ENCOUNTER — Encounter: Payer: Self-pay | Admitting: Psychiatry

## 2018-07-07 ENCOUNTER — Ambulatory Visit (INDEPENDENT_AMBULATORY_CARE_PROVIDER_SITE_OTHER): Payer: Medicare Other | Admitting: Psychiatry

## 2018-07-07 ENCOUNTER — Other Ambulatory Visit: Payer: Self-pay

## 2018-07-07 VITALS — BP 107/73 | HR 114 | Temp 97.9°F | Wt 205.4 lb

## 2018-07-07 DIAGNOSIS — F411 Generalized anxiety disorder: Secondary | ICD-10-CM | POA: Diagnosis not present

## 2018-07-07 DIAGNOSIS — F331 Major depressive disorder, recurrent, moderate: Secondary | ICD-10-CM | POA: Diagnosis not present

## 2018-07-07 DIAGNOSIS — R635 Abnormal weight gain: Secondary | ICD-10-CM

## 2018-07-07 DIAGNOSIS — T50905A Adverse effect of unspecified drugs, medicaments and biological substances, initial encounter: Secondary | ICD-10-CM

## 2018-07-07 DIAGNOSIS — F900 Attention-deficit hyperactivity disorder, predominantly inattentive type: Secondary | ICD-10-CM | POA: Diagnosis not present

## 2018-07-07 DIAGNOSIS — F5105 Insomnia due to other mental disorder: Secondary | ICD-10-CM

## 2018-07-07 MED ORDER — AMPHETAMINE-DEXTROAMPHETAMINE 30 MG PO TABS: 30.0000 mg | ORAL_TABLET | Freq: Every day | ORAL | 0 refills | Status: DC

## 2018-07-07 MED ORDER — TOPIRAMATE 50 MG PO TABS: 75.0000 mg | ORAL_TABLET | Freq: Every day | ORAL | 0 refills | Status: DC

## 2018-07-07 MED ORDER — AMPHETAMINE-DEXTROAMPHETAMINE 10 MG PO TABS: 10.0000 mg | ORAL_TABLET | Freq: Every day | ORAL | 0 refills | Status: DC

## 2018-07-07 MED ORDER — ARIPIPRAZOLE 20 MG PO TABS: 20.0000 mg | ORAL_TABLET | Freq: Every day | ORAL | 0 refills | Status: DC

## 2018-07-07 MED ORDER — VENLAFAXINE HCL ER 75 MG PO CP24: 225.0000 mg | ORAL_CAPSULE | Freq: Every day | ORAL | 0 refills | Status: DC

## 2018-07-07 NOTE — Progress Notes (Signed)
BH MD OP Progress Note  07/07/2018 2:29 PM [name] [name]  MRN:  [id number]  Chief Complaint: ' I am here for follow up.' Chief Complaint    Follow-up; Medication Refill     HPI: [name] [name] is a 37 year old Caucasian female, divorced, lives in [place], on [place], has a history of depression, anxiety, ADD, GI issues like achalasia, presented to the clinic today for a follow-up visit.  Patient today reports that she has noticed some depressive symptoms.  She reports lack of motivation, fatigue, low energy and so on.  She continues to be compliant with her Effexor and Abilify as prescribed.  Patient does report significant psychosocial stressors.  She reports her ex-husband took her to court for shared custody of her 60 yr old  daughter.  She  also reports that her 49 year old daughter was recently diagnosed with diabetes.  She hence has been  worried about the same.  She also reports she has noticed the effect of Adderall is wearing off towards the end of the day which is also affecting her.  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  Discussed readjusting the dosage of Adderall.  Reports she has been noncompliant with her Metformin as well as Topamax however she plans to restart the medications again soon.  Patient denies any suicidality.  Patient denies any perceptual disturbances.  Patient reports she is interested in genesight testing today.  Patient reports she forgot to get the labs done as ordered and hence will need a new lab slip.  Patient looks forward to Thanksgiving holidays and plans to spend it with her family. Visit Diagnosis:    ICD-10-CM   1. MDD (major depressive disorder), recurrent episode, moderate (HCC) F33.1 venlafaxine XR (EFFEXOR XR) 75 MG 24 hr capsule    topiramate (TOPAMAX) 50 MG tablet  2. GAD (generalized anxiety disorder) F41.1 venlafaxine XR (EFFEXOR XR) 75 MG 24 hr capsule    topiramate (TOPAMAX)  50 MG tablet  3. Attention deficit hyperactivity disorder (ADHD), predominantly inattentive type F90.0 amphetamine-dextroamphetamine (ADDERALL) 30 MG tablet    amphetamine-dextroamphetamine (ADDERALL) 10 MG tablet  4. Weight gain due to medication R63.5    T50.905A   5. Insomnia due to mental disorder F51.05     Past Psychiatric History: Reviewed past psychiatric history from my progress note on 04/14/2018.  Past trials of Cymbalta, Celexa, Wellbutrin, Lamictal, Adderall, Ritalin.  Past Medical History:  Past Medical History:  Diagnosis Date  . ADHD (attention deficit hyperactivity disorder)   . Anxiety   . Depression     Past Surgical History:  Procedure Laterality Date  . ABDOMINAL SURGERY    . CHOLECYSTECTOMY    . ESOPHAGECTOMY      Family Psychiatric History: Reviewed family psychiatric history from my progress note on 04/14/2018  Family History:  Family History  Problem Relation Age of Onset  . Anxiety disorder Mother   . Anxiety disorder Father   . ADD / ADHD Brother   . Anxiety disorder Brother   . Anxiety disorder Maternal Grandmother     Social History: Reviewed social history from my progress note on 04/14/2018 Social History   Socioeconomic History  . Marital status: Single    Spouse name: Not on file  . Number of children: Not on file  . Years of education: Not on file  . Highest education level: Not on file  Occupational History  . Not on file  Social Needs  .  Financial resource strain: Not hard at all  . Food insecurity:    Worry: Never true    Inability: Never true  . Transportation needs:    Medical: No    Non-medical: No  Tobacco Use  . Smoking status: Never Smoker  . Smokeless tobacco: Never Used  Substance and Sexual Activity  . Alcohol use: No  . Drug use: Yes    Types: Marijuana    Comment: last used 6 mths ago  . Sexual activity: Not Currently  Lifestyle  . Physical activity:    Days per week: 0 days    Minutes per session: 0 min  .  Stress: Not on file  Relationships  . Social connections:    Talks on phone: Not on file    Gets together: Not on file    Attends religious service: Never    Active member of club or organization: No    Attends meetings of clubs or organizations: Never    Relationship status: Not on file  Other Topics Concern  . Not on file  Social History Narrative  . Not on file    Allergies:  Allergies  Allergen Reactions  . Fentanyl Hives  . Metoclopramide Other (See Comments)  . Morphine Hives  . Butalbital-Apap-Caffeine Other (See Comments)    Paranoia  . Diazepam Other (See Comments)  . Penicillins   . Promethazine     seizures  . Latex Rash  . Lorazepam Other (See Comments)    combative    Metabolic Disorder Labs: No results found for: HGBA1C, MPG No results found for: PROLACTIN No results found for: CHOL, TRIG, HDL, CHOLHDL, VLDL, LDLCALC Lab Results  Component Value Date   TSH 0.733 06/19/2010   TSH 0.875 09/27/2009    Therapeutic Level Labs: No results found for: LITHIUM No results found for: VALPROATE No components found for:  CBMZ  Current Medications: Current Outpatient Medications  Medication Sig Dispense Refill  . amphetamine-dextroamphetamine (ADDERALL) 10 MG tablet Take 1 tablet (10 mg total) by mouth daily after lunch. 30 tablet 0  . amphetamine-dextroamphetamine (ADDERALL) 30 MG tablet Take 1 tablet by mouth daily. 30 tablet 0  . [START ON 07/11/2018] amphetamine-dextroamphetamine (ADDERALL) 30 MG tablet Take 1 tablet by mouth daily. 30 tablet 0  . ARIPiprazole (ABILIFY) 20 MG tablet Take 1 tablet (20 mg total) by mouth daily. 90 tablet 0  . hydrOXYzine (VISTARIL) 25 MG capsule Take 1 capsule (25 mg total) by mouth 3 (three) times daily as needed for anxiety. 75 capsule 1  . metFORMIN (GLUCOPHAGE) 500 MG tablet Take 1 tablet (500 mg total) by mouth daily with supper. 90 tablet 0  . ondansetron (ZOFRAN-ODT) 4 MG disintegrating tablet Take 8 mg by mouth.    .  pantoprazole (PROTONIX) 20 MG tablet Take 40 mg by mouth.    . pantoprazole (PROTONIX) 40 MG tablet   0  . sucralfate (CARAFATE) 1 g tablet Take 1 g by mouth 4 (four) times daily.  0  . topiramate (TOPAMAX) 50 MG tablet Take 1.5 tablets (75 mg total) by mouth at bedtime. 135 tablet 0  . venlafaxine XR (EFFEXOR XR) 75 MG 24 hr capsule Take 3 capsules (225 mg total) by mouth daily with breakfast. 270 capsule 0   No current facility-administered medications for this visit.      Musculoskeletal: Strength & Muscle Tone: within normal limits Gait & Station: normal Patient leans: N/A  Psychiatric Specialty Exam: Review of Systems  Psychiatric/Behavioral: Positive for depression. The  patient is nervous/anxious.   All other systems reviewed and are negative.   Blood pressure 107/73, pulse (!) 114, temperature 97.9 F (36.6 C), temperature source Oral, weight 205 lb 6.4 oz (93.2 kg).Body mass index is 36.38 kg/m.  General Appearance: Casual  Eye Contact:  Fair  Speech:  Clear and Coherent  Volume:  Normal  Mood:  Anxious and Depressed  Affect:  Congruent  Thought Process:  Goal Directed and Descriptions of Associations: Intact  Orientation:  Full (Time, Place, and Person)  Thought Content: Logical   Suicidal Thoughts:  No  Homicidal Thoughts:  No  Memory:  Immediate;   Fair Recent;   Fair Remote;   Fair  Judgement:  Fair  Insight:  Fair  Psychomotor Activity:  Normal  Concentration:  Concentration: Fair and Attention Span: Fair  Recall:  [employer]  Fund of Knowledge: Fair  Language: Fair  Akathisia:  No  Handed:  Right  AIMS (if indicated): 0  Assets:  Communication Skills Desire for Improvement Social Support  ADL's:  Intact  Cognition: WNL  Sleep:  Fair   Screenings:   Assessment and Plan: [name] is a 37 year old Caucasian female who has a history of depression, anxiety, ADD, esophageal achalasia, presented to the clinic today for a follow-up visit.  Patient continues to  struggle with depression and anxiety symptoms.  She does have several psychosocial stressors.  She also has attention and concentration problems and reports her current dosage of Adderall is ineffective.  We will continue to make medication changes as noted below.  Plan For depression Continue Effexor XR 225 mg p.o. daily Increase Abilify to 20 mg p.o. daily Topamax 75 mg p.o. daily  For anxiety disorder Effexor XR as prescribed Hydroxyzine 25 mg p.o. 3 times daily as needed  For ADD Continue Adderall 30 mg p.o. daily. Start Adderall 10 mg after lunch. I have reviewed [place] controlled substance database  Weight gain due to antipsychotic therapy. Continue metformin 500 mg p.o. daily-she has been noncompliant.  We will order the following labs-vitamin B12, TSH, CBC, CMP, lipid panel, prolactin, hemoglobin A1c.  Patient will continue psychotherapy sessions with [facility] a week.  We will order genesight testing today.  Follow-up in clinic in 4 weeks or sooner if needed.  More than 50 % of the time was spent for psychoeducation and supportive psychotherapy and care coordination.  This note was generated in part or whole with voice recognition software. Voice recognition is usually quite accurate but there are transcription errors that can and very often do occur. I apologize for any typographical errors that were not detected and corrected.        Jomarie LongsSaramma Keni Elison, MD 07/07/2018, 2:29 PM

## 2018-08-09 ENCOUNTER — Telehealth: Payer: Self-pay

## 2018-08-09 DIAGNOSIS — F900 Attention-deficit hyperactivity disorder, predominantly inattentive type: Secondary | ICD-10-CM

## 2018-08-09 MED ORDER — AMPHETAMINE-DEXTROAMPHETAMINE 10 MG PO TABS: 10.0000 mg | ORAL_TABLET | Freq: Every day | ORAL | 0 refills | Status: DC

## 2018-08-09 MED ORDER — AMPHETAMINE-DEXTROAMPHETAMINE 30 MG PO TABS: 30.0000 mg | ORAL_TABLET | Freq: Every day | ORAL | 0 refills | Status: DC

## 2018-08-09 NOTE — Telephone Encounter (Signed)
pt called states the abilify is working great but she needs refills on her adderall 10mg  and on the 30mg 

## 2018-08-09 NOTE — Telephone Encounter (Signed)
Sent adderall to pharmacy 

## 2018-08-23 ENCOUNTER — Ambulatory Visit (INDEPENDENT_AMBULATORY_CARE_PROVIDER_SITE_OTHER): Payer: Medicare Other | Admitting: Psychiatry

## 2018-08-23 ENCOUNTER — Encounter: Payer: Self-pay | Admitting: Psychiatry

## 2018-08-23 ENCOUNTER — Other Ambulatory Visit: Payer: Self-pay

## 2018-08-23 VITALS — BP 95/68 | HR 92 | Temp 99.0°F | Wt 200.4 lb

## 2018-08-23 DIAGNOSIS — F900 Attention-deficit hyperactivity disorder, predominantly inattentive type: Secondary | ICD-10-CM

## 2018-08-23 DIAGNOSIS — T50905A Adverse effect of unspecified drugs, medicaments and biological substances, initial encounter: Secondary | ICD-10-CM

## 2018-08-23 DIAGNOSIS — R635 Abnormal weight gain: Secondary | ICD-10-CM | POA: Diagnosis not present

## 2018-08-23 DIAGNOSIS — F331 Major depressive disorder, recurrent, moderate: Secondary | ICD-10-CM | POA: Diagnosis not present

## 2018-08-23 DIAGNOSIS — F411 Generalized anxiety disorder: Secondary | ICD-10-CM | POA: Diagnosis not present

## 2018-08-23 DIAGNOSIS — F5105 Insomnia due to other mental disorder: Secondary | ICD-10-CM

## 2018-08-23 MED ORDER — DESVENLAFAXINE SUCCINATE ER 25 MG PO TB24: 25.0000 mg | ORAL_TABLET | Freq: Every day | ORAL | 0 refills | Status: DC

## 2018-08-23 MED ORDER — VENLAFAXINE HCL 25 MG PO TABS: 25.0000 mg | ORAL_TABLET | ORAL | 0 refills | Status: DC

## 2018-08-23 NOTE — Patient Instructions (Signed)
Serotonin Syndrome  Serotonin is a chemical in your body (neurotransmitter) that helps to control several functions, such as:  · Brain and nerve cell function.  · Mood and emotions.  · Memory.  · Eating.  · Sleeping.  · Sexual activity.  · Stress response.  Having too much serotonin in your body can cause serotonin syndrome. This condition can be harmful to your brain and nerve cells. This can be a life-threatening condition.  What are the causes?  This condition may be caused by taking medicines or drugs that increase the level of serotonin in your body, such as:  · Antidepressant medicines.  · Migraine medicines.  · Certain pain medicines.  · Certain drugs, including ecstasy, LSD, cocaine, and amphetamines.  · Over-the-counter cough or cold medicines that contain dextromethorphan.  · Certain herbal supplements, including St. John's wort, ginseng, and nutmeg.  This condition usually occurs when you take these medicines or drugs in combination, but it can also happen with a high dose of a single medicine or drug.  What increases the risk?  You are more likely to develop this condition if:  · You just started taking a medicine or drug that increases the level of serotonin in the body.  · You recently increased the dose of a medicine or drug that increases the level of serotonin in the body.  · You take more than one medicine or drug that increases the level of serotonin in the body.  What are the signs or symptoms?  Symptoms of this condition usually start within several hours of taking a medicine or drug. Symptoms may be mild or severe. Mild symptoms include:  · Sweating.  · Restlessness or agitation.  · Muscle twitching or stiffness.  · Rapid heart rate.  · Nausea and vomiting.  · Diarrhea.  · Headache.  · Shivering or goose bumps.  · Confusion.  Severe symptoms include:  · Irregular heartbeat.  · Seizures.  · Loss of consciousness.  · High fever.  How is this diagnosed?  This condition may be diagnosed based  on:  · Your medical history.   · A physical exam.  · Your prior use of drugs and medicines.  · Blood or urine tests. These may be used to rule out other causes of your symptoms.  How is this treated?  The treatment for this condition depends on the severity of your symptoms.  · For mild cases, stopping the medicine or drug that caused your condition is usually all that is needed.  · For moderate to severe cases, treatment in a hospital may be needed to prevent or manage life-threatening symptoms. This may include medicines to control your symptoms, IV fluids, interventions to support your breathing, and treatments to control your body temperature.  Follow these instructions at home:  Medicines    · Take over-the-counter and prescription medicines only as told by your health care provider. This is important.  · Check with your health care provider before you start taking any new prescriptions, over-the-counter medicines, herbs, or supplements.  · Avoid combining any medicines that can cause this condition to occur.  Lifestyle    · Maintain a healthy lifestyle.  ? Eat a healthy diet that includes plenty of vegetables, fruits, whole grains, low-fat dairy products, and lean protein. Do not eat a lot of foods that are high in fat, added sugars, or salt.  ? Get the right amount and quality of sleep. Most adults need 7-9 hours of sleep each night.  ?   or tobacco, such as cigarettes and e-cigarettes. If you need help quitting, ask your health care provider.  Keep all follow-up visits as told by your health care provider. This is important. Contact a health care provider if:  Your symptoms do not  improve or they get worse. Get help right away if you:  Have worsening confusion, severe headache, chest pain, high fever, seizures, or loss of consciousness.  Experience serious side effects of medicine, such as swelling of your face, lips, tongue, or throat.  Have serious thoughts about hurting yourself or others. These symptoms may represent a serious problem that is an emergency. Do not wait to see if the symptoms will go away. Get medical help right away. Call your local emergency services (911 in the [place] Do not drive yourself to the hospital. If you ever feel like you may hurt yourself or others, or have thoughts about taking your own life, get help right away. You can go to your nearest emergency department or call:  Your local emergency services (911 in the [place]  A suicide crisis helpline, such as the National Suicide Prevention Lifeline at (682)342-62231-646 276 8487. This is open 24 hours a day. Summary  Serotonin is a brain chemical that helps to regulate the nervous system. High levels of serotonin in the body can cause serotonin syndrome, which is a very dangerous condition.  This condition may be caused by taking medicines or drugs that increase the level of serotonin in your body.  Treatment depends on the severity of your symptoms. For mild cases, stopping the medicine or drug that caused your condition is usually all that is needed.  Check with your health care provider before you start taking any new prescriptions, over-the-counter medicines, herbs, or supplements. This information is not intended to replace advice given to you by your health care provider. Make sure you discuss any questions you have with your health care provider. Document Released: 09/04/2004 Document Revised: 09/04/2017 Document Reviewed: 09/04/2017 Elsevier Interactive Patient Education  2019 Else[employer] Inc. Desvenlafaxine extended-release tablets What is this medicine? DESVENLAFAXINE (des VEN la Southern CompanyFAX een)  is used to treat depression. This medicine may be used for other purposes; ask your health care provider or pharmacist if you have questions. COMMON BRAND NAME(S): Bland SpanKhedezla, Pristiq What should I tell my health care provider before I take this medicine? They need to know if you have any of these conditions: -glaucoma -high blood pressure -kidney disease -liver disease -mania or bipolar disorder -suicidal thoughts or a previous suicide attempt -an unusual reaction to desvenlafaxine, venlafaxine, other medicines, foods, dyes, or preservatives -pregnant or trying to get pregnant -breast-feeding How should I use this medicine? Take this medicine by mouth with a drink of water. Do not crush, cut or chew. Follow the directions on the prescription label. Take your doses at regular intervals. Do not take your medicine more often than directed. Do not stop taking this medicine suddenly except upon the advice of your doctor. Stopping this medicine too quickly may cause serious side effects or your condition may worsen. Contact your pediatrician or health care professional regarding the use of this medicine in children. Special care may be needed. A special MedGuide will be given to you by the pharmacist with each prescription and refill. Be sure to read this information carefully each time. Overdosage: If you think you have taken too much of this medicine contact a poison control center or emergency room at once. NOTE: This medicine is only for you. Do not share  this medicine with others. What if I miss a dose? If you miss a dose, take it as soon as you can. If it is almost time for your next dose, take only that dose. Do not take double or extra doses. What may interact with this medicine? Do not take this medicine with any of the following medications: -duloxetine -levomilnacipran -linezolid -MAOIs like Carbex, Eldepryl, Marplan, Nardil, and Parnate -methylene blue (injected into a  vein) -milnacipran -venlafaxine This medicine may also interact with the following medications: -alcohol -amphetamines -aspirin and aspirin-like medicines -certain migraine headache medicines (almotriptan, eletriptan, frovatriptan, naratriptan, rizatriptan, sumatriptan, zolmitriptan) -dexfenfluramine or fenfluramine -furazolidone -isoniazid -lithium -medicines for heart rhythm or blood pressure -medicines that treat or prevent blood clots like warfarin, enoxaparin, and dalteparin -methylphenidate -metoclopramide -NSAIDS, medicines for pain and inflammation, like ibuprofen or naproxen -pentazocine -phentermine -procarbazine -protriptyline -rasagiline -sibutramine -St. John's wort, Hypericum perforatum -tramadol -tryptophan -zolpidem This list may not describe all possible interactions. Give your health care provider a list of all the medicines, herbs, non-prescription drugs, or dietary supplements you use. Also tell them if you smoke, drink alcohol, or use illegal drugs. Some items may interact with your medicine. What should I watch for while using this medicine? Tell your doctor if your symptoms do not get better or if they get worse. Visit your doctor or health care professional for regular checks on your progress. Because it may take several weeks to see the full effects of this medicine, it is important to continue your treatment as prescribed by your doctor. Patients and their families should watch out for new or worsening thoughts of suicide or depression. Also watch out for sudden changes in feelings such as feeling anxious, agitated, panicky, irritable, hostile, aggressive, impulsive, severely restless, overly excited and hyperactive, or not being able to sleep. If this happens, especially at the beginning of treatment or after a change in dose, call your health care professional. This medicine can cause an increase in blood pressure. Check with your doctor for instructions on  monitoring your blood pressure while taking this medicine. You may get drowsy or dizzy. Do not drive, use machinery, or do anything that needs mental alertness until you know how this medicine affects you. Do not stand or sit up quickly, especially if you are an older patient. This reduces the risk of dizzy or fainting spells. Alcohol may interfere with the effect of this medicine. Avoid alcoholic drinks. Your mouth may get dry. Chewing sugarless gum, sucking hard candy and drinking plenty of water will help. Contact your doctor if the problem does not go away or is severe. What side effects may I notice from receiving this medicine? Side effects that you should report to your doctor or health care professional as soon as possible: -allergic reactions like skin rash, itching or hives, swelling of the face, lips, or tongue -anxious -breathing problems -confusion -changes in vision -chest pain -confusion -elevated mood, decreased need for sleep, racing thoughts, impulsive behavior -eye pain -fast, irregular heartbeat -feeling faint or lightheaded, falls -feeling agitated, angry, or irritable -hallucination, loss of contact with reality -high blood pressure -loss of balance or coordination -palpitations -redness, blistering, peeling or loosening of the skin, including inside the mouth -restlessness, pacing, inability to keep still -seizures -stiff muscles -suicidal thoughts or other mood changes -trouble passing urine or change in the amount of urine -trouble sleeping -unusual bleeding or bruising -unusually weak or tired -vomiting Side effects that usually do not require medical attention (report to your  doctor or health care professional if they continue or are bothersome): -change in sex drive or performance -change in appetite or weight -constipation -dizziness -dry mouth -headache -increased sweating -nausea -tired This list may not describe all possible side effects. Call  your doctor for medical advice about side effects. You may report side effects to FDA at 1-800-FDA-1088. Where should I keep my medicine? Keep out of the reach of children. Store at room temperature between 15 and 30 degrees C (59 and 86 degrees F). Throw away any unused medicine after the expiration date. NOTE: This sheet is a summary. It may not cover all possible information. If you have questions about this medicine, talk to your doctor, pharmacist, or health care provider.  2019 Elsevier/Gold Standard (2015-12-27 17:35:48)

## 2018-08-23 NOTE — Progress Notes (Signed)
[facility] MD OP Progress Note  08/23/2018 5:53 PM [name]  MRN:  [id number]  Chief Complaint: ' I am here for follow up/" Chief Complaint    Follow-up; Medication Problem; Anxiety; Depression     HPI: [name] [name] is a 38 year old Caucasian female, divorced, [place] in [place], has a history of depression, anxiety, ADD, GI issues like achalasia, presented to the clinic today for a follow-up visit.  Patient today reports worsening depressive symptoms.  She reports lack of motivation, fatigue, low energy.  She reports she has been compliant with her Effexor and Abilify however does not think the medications as helpful.  Patient reports sleep is excessive.  Patient reports she is compliant on her Adderall which helps.  Patient reports she does have psychosocial stressors like legal issues, custody battle of her [id number] year old daughter.  She however reports she is not worried too much about it since she knows its going to go well.  She continues to have good support system from her boyfriend.  Patient has not been seeing her therapist since her therapist relocated.  Discussed sending a referral to our therapist here in clinic.  She will start seeing her.   Visit Diagnosis:    ICD-10-CM   1. MDD (major depressive disorder), recurrent episode, moderate (HCC) F33.1   2. GAD (generalized anxiety disorder) F41.1   3. Attention deficit hyperactivity disorder (ADHD), predominantly inattentive type F90.0   4. Weight gain due to medication R63.5    T50.905A   5. Insomnia due to mental disorder F51.05     Past Psychiatric History: I have reviewed past psychiatric history from my progress note on 04/14/2018.  Past trials of Cymbalta, Celexa, Wellbutrin, Lamictal, Adderall, Ritalin  Past Medical History:  Past Medical History:  Diagnosis Date  . ADHD (attention deficit hyperactivity disorder)   . Anxiety   . Depression     Past Surgical History:  Procedure Laterality Date  . ABDOMINAL  SURGERY    . CHOLECYSTECTOMY    . ESOPHAGECTOMY      Family Psychiatric History: Reviewed family psychiatric history from my progress note on 04/14/2018  Family History:  Family History  Problem Relation Age of Onset  . Anxiety disorder Mother   . Anxiety disorder Father   . ADD / ADHD Brother   . Anxiety disorder Brother   . Anxiety disorder Maternal Grandmother     Social History: Reviewed social history from my progress note on 04/14/2018. Social History   Socioeconomic History  . Marital status: Single    Spouse name: Not on file  . Number of children: Not on file  . Years of education: Not on file  . Highest education level: Not on file  Occupational History  . Not on file  Social Needs  . Financial resource strain: Not hard at all  . Food insecurity:    Worry: Never true    Inability: Never true  . Transportation needs:    Medical: No    Non-medical: No  Tobacco Use  . Smoking status: Never Smoker  . Smokeless tobacco: Never Used  Substance and Sexual Activity  . Alcohol use: No  . Drug use: Yes    Types: Marijuana    Comment: last used 6 mths ago  . Sexual activity: Not Currently  Lifestyle  . Physical activity:    Days per week: 0 days    Minutes per session: 0 min  . Stress: Not on file  Relationships  . Social connections:  Talks on phone: Not on file    Gets together: Not on file    Attends religious service: Never    Active member of club or organization: No    Attends meetings of clubs or organizations: Never    Relationship status: Not on file  Other Topics Concern  . Not on file  Social History Narrative  . Not on file    Allergies:  Allergies  Allergen Reactions  . Fentanyl Hives  . Metoclopramide Other (See Comments)  . Morphine Hives  . Butalbital-Apap-Caffeine Other (See Comments)    Paranoia  . Diazepam Other (See Comments)  . Penicillins   . Promethazine     seizures  . Latex Rash  . Lorazepam Other (See Comments)     combative    Metabolic Disorder Labs: No results found for: HGBA1C, MPG No results found for: PROLACTIN No results found for: CHOL, TRIG, HDL, CHOLHDL, VLDL, LDLCALC Lab Results  Component Value Date   TSH 0.733 06/19/2010   TSH 0.875 09/27/2009    Therapeutic Level Labs: No results found for: LITHIUM No results found for: VALPROATE No components found for:  CBMZ  Current Medications: Current Outpatient Medications  Medication Sig Dispense Refill  . amphetamine-dextroamphetamine (ADDERALL) 10 MG tablet Take 1 tablet (10 mg total) by mouth daily after lunch. 30 tablet 0  . amphetamine-dextroamphetamine (ADDERALL) 30 MG tablet Take 1 tablet by mouth daily. 30 tablet 0  . amphetamine-dextroamphetamine (ADDERALL) 30 MG tablet Take 1 tablet by mouth daily. 30 tablet 0  . ARIPiprazole (ABILIFY) 20 MG tablet Take 1 tablet (20 mg total) by mouth daily. 90 tablet 0  . hydrOXYzine (VISTARIL) 25 MG capsule Take 1 capsule (25 mg total) by mouth 3 (three) times daily as needed for anxiety. 75 capsule 1  . metFORMIN (GLUCOPHAGE) 500 MG tablet Take 1 tablet (500 mg total) by mouth daily with supper. 90 tablet 0  . ondansetron (ZOFRAN-ODT) 4 MG disintegrating tablet Take 8 mg by mouth.    . pantoprazole (PROTONIX) 20 MG tablet Take 40 mg by mouth.    . pantoprazole (PROTONIX) 40 MG tablet   0  . sucralfate (CARAFATE) 1 g tablet Take 1 g by mouth 4 (four) times daily.  0  . topiramate (TOPAMAX) 50 MG tablet Take 1.5 tablets (75 mg total) by mouth at bedtime. 135 tablet 0  . Desvenlafaxine Succinate ER (PRISTIQ) 25 MG TB24 Take 25 mg by mouth at bedtime. 30 tablet 0  . venlafaxine (EFFEXOR) 25 MG tablet Take 1 tablet (25 mg total) by mouth as directed. Take it every other day and stop. 5 tablet 0   No current facility-administered medications for this visit.      Musculoskeletal: Strength & Muscle Tone: within normal limits Gait & Station: normal Patient leans: N/A  Psychiatric Specialty  Exam: Review of Systems  Psychiatric/Behavioral: Positive for depression. The patient is nervous/anxious.   All other systems reviewed and are negative.   Blood pressure 95/68, pulse 92, temperature 99 F (37.2 C), temperature source Oral, weight 200 lb 6.4 oz (90.9 kg).Body mass index is 35.5 kg/m.  General Appearance: Casual  Eye Contact:  Fair  Speech:  Clear and Coherent  Volume:  Normal  Mood:  Anxious and Depressed  Affect:  Appropriate  Thought Process:  Goal Directed and Descriptions of Associations: Intact  Orientation:  Full (Time, Place, and Person)  Thought Content: Logical   Suicidal Thoughts:  No  Homicidal Thoughts:  No  Memory:  Immediate;  Fair Recent;   Fair Remote;   Fair  Judgement:  Fair  Insight:  Fair  Psychomotor Activity:  Normal  Concentration:  Concentration: Fair and Attention Span: Fair  Recall:  [employer] of Knowledge: Fair  Language: Fair  Akathisia:  No  Handed:  Right  AIMS (if indicated): 0  Assets:  Communication Skills Desire for Improvement Social Support  ADL's:  Intact  Cognition: WNL  Sleep:  excessive   Screenings:   Assessment and Plan: Shalla is a 38 year old Caucasian female who has a history of depression, anxiety, ADD, esophageal achalasia, presented to the clinic today for a follow-up visit.  Patient today reports worsening depressive symptoms.  She denies any suicidality.  Discussed medication changes as noted below.  Plan For depression-worsening Taper of Effexor extended release. Start Pristiq 25 mg p.o. at bedtime. Topamax 75 mg p.o. daily.  For anxiety disorder-worsening Discontinue Effexor. Continue hydroxyzine 25 mg p.o. 3 times daily as needed Restart Pristiq.  For ADD Continue Adderall 30 mg p.o. daily Adderall 10 mg after lunch. I have reviewed [place] controlled substance database  For weight gain due to antipsychotic therapy. She is noncompliant with metformin. She will continue Topamax which is  helpful.  I will refer patient to our therapist here in clinic-Ms. Heidi Dach.  I have reviewed [employer] testing results today.  I have discussed [employer] testing with patient.  [employer] testing report was made use of while making medication changes.  Pending labs-vitamin B12, TSH, CBC, CMP, lipid panel, prolactin, hemoglobin A1c.  Follow-up in clinic in 1 to 2 weeks or sooner if needed.  I have spent atleast 25 minutes face to face with patient today. More than 50 % of the time was spent for psychoeducation and supportive psychotherapy and care coordination.  This note was generated in part or whole with voice recognition software. Voice recognition is usually quite accurate but there are transcription errors that can and very often do occur. I apologize for any typographical errors that were not detected and corrected.        Jomarie Longs, MD 08/23/2018, 5:53 PM

## 2018-08-31 ENCOUNTER — Ambulatory Visit (INDEPENDENT_AMBULATORY_CARE_PROVIDER_SITE_OTHER): Payer: Medicare Other | Admitting: Psychiatry

## 2018-08-31 ENCOUNTER — Encounter: Payer: Self-pay | Admitting: Psychiatry

## 2018-08-31 ENCOUNTER — Other Ambulatory Visit: Payer: Self-pay

## 2018-08-31 VITALS — BP 128/85 | HR 80 | Temp 97.6°F | Wt 199.0 lb

## 2018-08-31 DIAGNOSIS — F411 Generalized anxiety disorder: Secondary | ICD-10-CM

## 2018-08-31 DIAGNOSIS — F331 Major depressive disorder, recurrent, moderate: Secondary | ICD-10-CM | POA: Diagnosis not present

## 2018-08-31 DIAGNOSIS — F900 Attention-deficit hyperactivity disorder, predominantly inattentive type: Secondary | ICD-10-CM

## 2018-08-31 MED ORDER — DESVENLAFAXINE SUCCINATE ER 50 MG PO TB24: 50.0000 mg | ORAL_TABLET | Freq: Every day | ORAL | 0 refills | Status: DC

## 2018-08-31 MED ORDER — ARIPIPRAZOLE 5 MG PO TABS: 5.0000 mg | ORAL_TABLET | Freq: Every day | ORAL | 0 refills | Status: DC

## 2018-08-31 MED ORDER — HYDROXYZINE HCL 25 MG PO TABS: 12.5000 mg | ORAL_TABLET | Freq: Two times a day (BID) | ORAL | 2 refills | Status: DC | PRN

## 2018-08-31 NOTE — Progress Notes (Signed)
BH MD OP Progress Note  08/31/2018 5:45 PM [name] [name]  MRN:  [id number]  Chief Complaint: ' I am here for follow up." Chief Complaint    Follow-up; Medication Problem; Depression; Fatigue     HPI: [name] [name] is a 38 year old Caucasian female, divorced, lives in [place], on [place], has a history of depression, anxiety, ADD, GI issues like achalasia, presented to the clinic today for a follow-up visit.  Patient reports she is completely off of the Effexor.  She reports the past few days she struggled with withdrawal symptoms like restlessness, memory problems and so on.  She also had some sweats on and off.  She reports she is taking the Pristiq every day denies any significant side effects to it.  She however has not noticed any good benefits from the medication yet.  Discussed with patient that her Pristiq can be readjusted today.  She reports she sleeps excessive during the day.  There are times when she cannot get out of the bed.  She reports she has to push herself to do the chores and take care of the children.  She does have social support from her aunt.  Patient denies any suicidality.  Patient continues to be compliant on her Adderall it helps.  Patient has not started psychotherapy sessions yet.  Discussed with her again to reach out to our therapist here in clinic to make an appointment.  She agrees with plan. Visit Diagnosis:    ICD-10-CM   1. MDD (major depressive disorder), recurrent episode, moderate (HCC) F33.1 desvenlafaxine (PRISTIQ) 50 MG 24 hr tablet  2. GAD (generalized anxiety disorder) F41.1 desvenlafaxine (PRISTIQ) 50 MG 24 hr tablet  3. Attention deficit hyperactivity disorder (ADHD), predominantly inattentive type F90.0     Past Psychiatric History: Have reviewed past psychiatric history from my progress note on 04/14/2018.  Past trials of Cymbalta, Celexa, Wellbutrin, Lamictal, Adderall, Ritalin  Past Medical History:  Past Medical History:  Diagnosis Date  .  ADHD (attention deficit hyperactivity disorder)   . Anxiety   . Depression     Past Surgical History:  Procedure Laterality Date  . ABDOMINAL SURGERY    . CHOLECYSTECTOMY    . ESOPHAGECTOMY      Family Psychiatric History: I have reviewed family psychiatric history from my progress note on 04/14/2018.  Family History:  Family History  Problem Relation Age of Onset  . Anxiety disorder Mother   . Anxiety disorder Father   . ADD / ADHD Brother   . Anxiety disorder Brother   . Anxiety disorder Maternal Grandmother     Social History: I have reviewed social history from my progress note on 04/14/2018. Social History   Socioeconomic History  . Marital status: Single    Spouse name: Not on file  . Number of children: Not on file  . Years of education: Not on file  . Highest education level: Not on file  Occupational History  . Not on file  Social Needs  . Financial resource strain: Not hard at all  . Food insecurity:    Worry: Never true    Inability: Never true  . Transportation needs:    Medical: No    Non-medical: No  Tobacco Use  . Smoking status: Never Smoker  . Smokeless tobacco: Never Used  Substance and Sexual Activity  . Alcohol use: No  . Drug use: Yes    Types: Marijuana    Comment: last used 6 mths ago  . Sexual activity: Not Currently  Lifestyle  . Physical activity:    Days per week: 0 days    Minutes per session: 0 min  . Stress: Not on file  Relationships  . Social connections:    Talks on phone: Not on file    Gets together: Not on file    Attends religious service: Never    Active member of club or organization: No    Attends meetings of clubs or organizations: Never    Relationship status: Not on file  Other Topics Concern  . Not on file  Social History Narrative  . Not on file    Allergies:  Allergies  Allergen Reactions  . Fentanyl Hives  . Metoclopramide Other (See Comments)  . Morphine Hives  . Butalbital-Apap-Caffeine Other (See  Comments)    Paranoia  . Diazepam Other (See Comments)  . Penicillins   . Promethazine     seizures  . Latex Rash  . Lorazepam Other (See Comments)    combative    Metabolic Disorder Labs: No results found for: HGBA1C, MPG No results found for: PROLACTIN No results found for: CHOL, TRIG, HDL, CHOLHDL, VLDL, LDLCALC Lab Results  Component Value Date   TSH 0.733 06/19/2010   TSH 0.875 09/27/2009    Therapeutic Level Labs: No results found for: LITHIUM No results found for: VALPROATE No components found for:  CBMZ  Current Medications: Current Outpatient Medications  Medication Sig Dispense Refill  . amphetamine-dextroamphetamine (ADDERALL) 10 MG tablet Take 1 tablet (10 mg total) by mouth daily after lunch. 30 tablet 0  . amphetamine-dextroamphetamine (ADDERALL) 30 MG tablet Take 1 tablet by mouth daily. 30 tablet 0  . amphetamine-dextroamphetamine (ADDERALL) 30 MG tablet Take 1 tablet by mouth daily. 30 tablet 0  . ARIPiprazole (ABILIFY) 20 MG tablet Take 1 tablet (20 mg total) by mouth daily. 90 tablet 0  . metFORMIN (GLUCOPHAGE) 500 MG tablet Take 1 tablet (500 mg total) by mouth daily with supper. 90 tablet 0  . ondansetron (ZOFRAN-ODT) 4 MG disintegrating tablet Take 8 mg by mouth.    . pantoprazole (PROTONIX) 20 MG tablet Take 40 mg by mouth.    . pantoprazole (PROTONIX) 40 MG tablet   0  . sucralfate (CARAFATE) 1 g tablet Take 1 g by mouth 4 (four) times daily.  0  . topiramate (TOPAMAX) 50 MG tablet Take 1.5 tablets (75 mg total) by mouth at bedtime. 135 tablet 0  . ARIPiprazole (ABILIFY) 5 MG tablet Take 1 tablet (5 mg total) by mouth daily. To be added to 20 mg 30 tablet 0  . desvenlafaxine (PRISTIQ) 50 MG 24 hr tablet Take 1 tablet (50 mg total) by mouth at bedtime. 30 tablet 0  . hydrOXYzine (ATARAX/VISTARIL) 25 MG tablet Take 0.5-1 tablets (12.5-25 mg total) by mouth 2 (two) times daily as needed for anxiety. 60 tablet 2   No current facility-administered  medications for this visit.      Musculoskeletal: Strength & Muscle Tone: within normal limits Gait & Station: normal Patient leans: N/A  Psychiatric Specialty Exam: Review of Systems  Psychiatric/Behavioral: Positive for depression. The patient is nervous/anxious.   All other systems reviewed and are negative.   Blood pressure 128/85, pulse 80, temperature 97.6 F (36.4 C), temperature source Oral, weight 199 lb (90.3 kg).Body mass index is 35.25 kg/m.  General Appearance: Casual  Eye Contact:  Fair  Speech:  Clear and Coherent  Volume:  Normal  Mood:  Anxious and Dysphoric  Affect:  Appropriate  Thought Process:  Goal Directed and Descriptions of Associations: Intact  Orientation:  Full (Time, Place, and Person)  Thought Content: Logical   Suicidal Thoughts:  No  Homicidal Thoughts:  No  Memory:  Immediate;   Fair Recent;   Fair Remote;   Fair  Judgement:  Fair  Insight:  Fair  Psychomotor Activity:  Normal  Concentration:  Concentration: Fair and Attention Span: Fair  Recall:  [employer]  Fund of Knowledge: Fair  Language: Fair  Akathisia:  No  Handed:  Right  AIMS (if indicated): denies tremors,rigidity,stiffness  Assets:  Communication Skills Desire for Improvement Social Support  ADL's:  Intact  Cognition: WNL  Sleep:  Fair   Screenings:   Assessment and Plan: [name] is a 38 year old Caucasian female who has a history of depression, anxiety, ADD, esophageal achalasia, presented to the clinic today for a follow-up visit.  Patient continues to struggle with depression and anxiety symptoms.  We will continue to make medication readjustment.  We will also recommend psychotherapy.  Plan For depression-unstable Increase Pristiq to 50 mg at bedtime Topamax 75 mg p.o. daily Increase Abilify 25 mg p.o. daily.  For anxiety disorder-unstable Continue hydroxyzine 12.5 to 25 mg daily as needed. Increase Pristiq to 50 mg at bedtime.  For ADD-stable Adderall 30 mg  p.o. daily Adderall 10 mg after lunch.   For weight gain due to antipsychotic therapy. Continue Topamax which is helpful.  Will refer patient again to Ms. Heidi DachKelsey Craig psychotherapist.  I have reviewed Gene[employer]ight testing results while making medication changes.  Pending labs-vitamin B12, TSH, CBC, CMP, lipid panel, prolactin, hemoglobin A1c.  Follow-up in clinic in 1 week or sooner if needed.  I have spent atleast 25 minutes face to face with patient today. More than 50 % of the time was spent for psychoeducation and supportive psychotherapy and care coordination.        Jomarie LongsSaramma Korine Winton, MD 08/31/2018, 5:45 PM

## 2018-09-02 ENCOUNTER — Telehealth: Payer: Self-pay

## 2018-09-02 NOTE — Telephone Encounter (Signed)
pt desvenlafaxine suc er 50mg  was approved until  08-11-19

## 2018-09-08 ENCOUNTER — Ambulatory Visit (HOSPITAL_BASED_OUTPATIENT_CLINIC_OR_DEPARTMENT_OTHER): Payer: Medicare Other | Admitting: Psychiatry

## 2018-09-08 ENCOUNTER — Encounter: Payer: Self-pay | Admitting: Psychiatry

## 2018-09-08 VITALS — BP 100/69 | HR 85 | Ht 63.75 in | Wt 199.0 lb

## 2018-09-08 DIAGNOSIS — F411 Generalized anxiety disorder: Secondary | ICD-10-CM | POA: Diagnosis not present

## 2018-09-08 DIAGNOSIS — F5105 Insomnia due to other mental disorder: Secondary | ICD-10-CM

## 2018-09-08 DIAGNOSIS — F331 Major depressive disorder, recurrent, moderate: Secondary | ICD-10-CM | POA: Diagnosis not present

## 2018-09-08 DIAGNOSIS — T50905A Adverse effect of unspecified drugs, medicaments and biological substances, initial encounter: Secondary | ICD-10-CM

## 2018-09-08 DIAGNOSIS — F900 Attention-deficit hyperactivity disorder, predominantly inattentive type: Secondary | ICD-10-CM

## 2018-09-08 DIAGNOSIS — R635 Abnormal weight gain: Secondary | ICD-10-CM | POA: Diagnosis not present

## 2018-09-08 MED ORDER — AMPHETAMINE-DEXTROAMPHETAMINE 30 MG PO TABS: 30.0000 mg | ORAL_TABLET | Freq: Every day | ORAL | 0 refills | Status: DC

## 2018-09-08 MED ORDER — DESVENLAFAXINE SUCCINATE ER 25 MG PO TB24: 25.0000 mg | ORAL_TABLET | Freq: Every day | ORAL | 1 refills | Status: DC

## 2018-09-08 MED ORDER — AMPHETAMINE-DEXTROAMPHETAMINE 10 MG PO TABS: 10.0000 mg | ORAL_TABLET | Freq: Every day | ORAL | 0 refills | Status: DC

## 2018-09-08 MED ORDER — DESVENLAFAXINE SUCCINATE ER 50 MG PO TB24: 50.0000 mg | ORAL_TABLET | Freq: Every day | ORAL | 0 refills | Status: DC

## 2018-09-08 NOTE — Progress Notes (Signed)
BH MD OP Progress Note  09/08/2018 5:36 PM ZANILAH DRAPKIN  MRN:  [id number]  Chief Complaint:  ' I am here for follow up." Chief Complaint    Follow-up     HPI: [name] [name] is a 38 year-old Caucasian female, divorced, lives in [place], on [place], has a history of depression, anxiety, ADD, GI issues like achalasia, presented to clinic today for a follow-up visit.  Patient today reports that she continues to struggle with depressive symptoms.  She reports anhedonia, lack of motivation, low energy, excessive sleep during the day, inability to take care of her children and so on.  Patient today reports she has not been honest with Clinical research associate during her previous visits.  Patient's mother Ms. Letitia Neri was present today and provided collateral information.  Per mother patient has not been functioning well since the past several months.  She has not been able to feed the children or help them with their homework and assignments. Hence mother has been taking care of the children every day from 9 AM to at least 2 PM or 3 PM in the afternoon.  Patient just sits on her couch all day and placed on her phone and does not take care of herself or her children.  Patient also has not been eating well and survives on a snack bar most days.  Mother reports ever since the Abilify was increased, she has been more depressed and struggling.  Patient denies any suicidality.  Patient denies any perceptual disturbances.  Discussed inpatient admission versus intensive outpatient program referral.  Patient reports she wants to wait at least for a week and then go further inpatient appointment if possible and if her medications are not effective by then.  Patient's Pristiq dosage was increased a week ago and she has not noticed much benefit from the same.      Visit Diagnosis:    ICD-10-CM   1. MDD (major depressive disorder), recurrent episode, moderate (HCC) F33.1 desvenlafaxine (PRISTIQ) 50 MG 24 hr tablet   Desvenlafaxine Succinate ER (PRISTIQ) 25 MG TB24  2. GAD (generalized anxiety disorder) F41.1 desvenlafaxine (PRISTIQ) 50 MG 24 hr tablet  3. Attention deficit hyperactivity disorder (ADHD), predominantly inattentive type F90.0 amphetamine-dextroamphetamine (ADDERALL) 30 MG tablet    amphetamine-dextroamphetamine (ADDERALL) 10 MG tablet  4. Weight gain due to medication R63.5    T50.905A   5. Insomnia due to mental disorder F51.05    improved     Past Psychiatric History: Reviewed past psychiatric history from my progress note on 04/14/2018.  Past trials of Cymbalta, Celexa, Wellbutrin, Lamictal, Adderall, Ritalin.      Past Medical History:  Past Medical History:  Diagnosis Date  . ADHD (attention deficit hyperactivity disorder)   . Anxiety   . Depression     Past Surgical History:  Procedure Laterality Date  . ABDOMINAL SURGERY    . CHOLECYSTECTOMY    . ESOPHAGECTOMY      Family Psychiatric History: Reviewed family psychiatric history from my progress note on 04/14/2018.  Family History:  Family History  Problem Relation Age of Onset  . Anxiety disorder Mother   . Anxiety disorder Father   . ADD / ADHD Brother   . Anxiety disorder Brother   . Anxiety disorder Maternal Grandmother     Social History: Reviewed social history from my progress note on 04/14/2018 Social History   Socioeconomic History  . Marital status: Single    Spouse name: Not on file  . Number of children: Not  on file  . Years of education: Not on file  . Highest education level: Not on file  Occupational History  . Not on file  Social Needs  . Financial resource strain: Not hard at all  . Food insecurity:    Worry: Never true    Inability: Never true  . Transportation needs:    Medical: No    Non-medical: No  Tobacco Use  . Smoking status: Never Smoker  . Smokeless tobacco: Never Used  Substance and Sexual Activity  . Alcohol use: No  . Drug use: Not Currently    Types: Marijuana     Comment: last used 6 mths ago  . Sexual activity: Yes    Partners: Male    Birth control/protection: Condom  Lifestyle  . Physical activity:    Days per week: 0 days    Minutes per session: 0 min  . Stress: Not on file  Relationships  . Social connections:    Talks on phone: Not on file    Gets together: Not on file    Attends religious service: Never    Active member of club or organization: No    Attends meetings of clubs or organizations: Never    Relationship status: Not on file  Other Topics Concern  . Not on file  Social History Narrative  . Not on file    Allergies:  Allergies  Allergen Reactions  . Fentanyl Hives  . Metoclopramide Other (See Comments)  . Morphine Hives  . Butalbital-Apap-Caffeine Other (See Comments)    Paranoia  . Diazepam Other (See Comments)  . Penicillins   . Promethazine     seizures  . Latex Rash  . Lorazepam Other (See Comments)    combative    Metabolic Disorder Labs: No results found for: HGBA1C, MPG No results found for: PROLACTIN No results found for: CHOL, TRIG, HDL, CHOLHDL, VLDL, LDLCALC Lab Results  Component Value Date   TSH 0.733 06/19/2010   TSH 0.875 09/27/2009    Therapeutic Level Labs: No results found for: LITHIUM No results found for: VALPROATE No components found for:  CBMZ  Current Medications: Current Outpatient Medications  Medication Sig Dispense Refill  . amphetamine-dextroamphetamine (ADDERALL) 10 MG tablet Take 1 tablet (10 mg total) by mouth daily after lunch. 30 tablet 0  . amphetamine-dextroamphetamine (ADDERALL) 30 MG tablet Take 1 tablet by mouth daily. 30 tablet 0  . amphetamine-dextroamphetamine (ADDERALL) 30 MG tablet Take 1 tablet by mouth daily. 30 tablet 0  . ARIPiprazole (ABILIFY) 5 MG tablet Take 1 tablet (5 mg total) by mouth daily. To be added to 20 mg 30 tablet 0  . desvenlafaxine (PRISTIQ) 50 MG 24 hr tablet Take 1 tablet (50 mg total) by mouth at bedtime. To be added to 25 mg 30  tablet 0  . Desvenlafaxine Succinate ER (PRISTIQ) 25 MG TB24 Take 25 mg by mouth at bedtime. To be added with 50 mg 30 tablet 1  . hydrOXYzine (ATARAX/VISTARIL) 25 MG tablet Take 0.5-1 tablets (12.5-25 mg total) by mouth 2 (two) times daily as needed for anxiety. 60 tablet 2  . metFORMIN (GLUCOPHAGE) 500 MG tablet Take 1 tablet (500 mg total) by mouth daily with supper. 90 tablet 0  . ondansetron (ZOFRAN-ODT) 4 MG disintegrating tablet Take 8 mg by mouth.    . pantoprazole (PROTONIX) 20 MG tablet Take 40 mg by mouth.    . pantoprazole (PROTONIX) 40 MG tablet   0  . sucralfate (CARAFATE) 1 g tablet  Take 1 g by mouth 4 (four) times daily.  0  . topiramate (TOPAMAX) 50 MG tablet Take 1.5 tablets (75 mg total) by mouth at bedtime. 135 tablet 0   No current facility-administered medications for this visit.      Musculoskeletal: Strength & Muscle Tone: within normal limits Gait & Station: normal Patient leans: N/A  Psychiatric Specialty Exam: Review of Systems  Constitutional: Positive for malaise/fatigue.  Psychiatric/Behavioral: Positive for depression. The patient is nervous/anxious.   All other systems reviewed and are negative.   Blood pressure 100/69, pulse 85, height 5' 3.75" (1.619 m), weight 199 lb (90.3 kg).Body mass index is 34.43 kg/m.  General Appearance: Casual  Eye Contact:  Fair  Speech:  Clear and Coherent  Volume:  Normal  Mood:  Depressed and Dysphoric  Affect:  Depressed  Thought Process:  Goal Directed and Descriptions of Associations: Intact  Orientation:  Full (Time, Place, and Person)  Thought Content: Logical   Suicidal Thoughts:  No  Homicidal Thoughts:  No  Memory:  Immediate;   Fair Recent;   Fair Remote;   Fair  Judgement:  Fair  Insight:  Fair  Psychomotor Activity:  Decreased  Concentration:  Concentration: Fair and Attention Span: Fair  Recall:  [employer]  Fund of Knowledge: Fair  Language: Fair  Akathisia:  No  Handed:  Right  AIMS (if  indicated): denies tremors, rigidity  Assets:  Communication Skills Desire for Improvement Social Support  ADL's:  Intact  Cognition: WNL  Sleep:  excessive   Screenings:   Assessment and Plan: [name] is a 38 year old Caucasian female who has a history of depression, anxiety, ADD, esophageal achalasia, presented to clinic today for a follow-up visit.  Patient continues to struggle with depressive symptoms.  Patient today presented with mother who provided collateral information stating that patient may not have been honest about her symptoms since the past several months.  Patient will benefit from medication changes as well as psychotherapy sessions.     Plan For depression-unstable Increase Pristiq to 75 mg p.o. nightly Reduce Abilify to 5 mg p.o. daily Topamax 75 mg p.o. daily Starting lithium or another medication in the class of Abilify however patient as well as mother declines  For anxiety disorder-unstable Hydroxyzine 12.5 to 25 mg p.o. daily as needed Pristiq 75 mg p.o. nightly  For ADD-stable Adderall 30 mg p.o. daily Adderall 10 mg p.o. after lunch.  For weight gain due to antipsychotic therapy Continue Topamax.  She will start psychotherapy sessions with Ms. Heidi DachKelsey Craig here in clinic.  I have reviewed GeneSight testing results while making medication changes.  I have obtained collateral information from Ms. Letitia NeriSherry Adams at [phone].  Some time was spent educating patient about medications.  Also discussed possible inpatient mental health admission if continues to decompensate.  Follow-up in clinic in 1 week or sooner if needed.  I have spent atleast 25 minutes face to face with patient today. More than 50 % of the time was spent for psychoeducation and supportive psychotherapy and care coordination.  This note was generated in part or whole with voice recognition software. Voice recognition is usually quite accurate but there are transcription  errors that can and very often do occur. I apologize for any typographical errors that were not detected and corrected.         Jomarie LongsSaramma Rishon Thilges, MD 09/08/2018, 5:36 PM

## 2018-09-16 ENCOUNTER — Ambulatory Visit (INDEPENDENT_AMBULATORY_CARE_PROVIDER_SITE_OTHER): Payer: Medicare Other | Admitting: Psychiatry

## 2018-09-16 ENCOUNTER — Other Ambulatory Visit: Payer: Self-pay

## 2018-09-16 ENCOUNTER — Encounter: Payer: Self-pay | Admitting: Psychiatry

## 2018-09-16 VITALS — BP 97/67 | HR 84 | Temp 97.7°F | Wt 199.8 lb

## 2018-09-16 DIAGNOSIS — F411 Generalized anxiety disorder: Secondary | ICD-10-CM | POA: Diagnosis not present

## 2018-09-16 DIAGNOSIS — F5105 Insomnia due to other mental disorder: Secondary | ICD-10-CM

## 2018-09-16 DIAGNOSIS — F331 Major depressive disorder, recurrent, moderate: Secondary | ICD-10-CM

## 2018-09-16 DIAGNOSIS — F900 Attention-deficit hyperactivity disorder, predominantly inattentive type: Secondary | ICD-10-CM

## 2018-09-16 MED ORDER — DESVENLAFAXINE SUCCINATE ER 25 MG PO TB24: 25.0000 mg | ORAL_TABLET | Freq: Every day | ORAL | 0 refills | Status: DC

## 2018-09-16 NOTE — Progress Notes (Signed)
BH MD OP Progress Note  09/16/2018 2:09 PM [name] [name]  MRN:  [id number]  Chief Complaint: ' I am here for follow up.' Chief Complaint    Follow-up     HPI: [name] [name] is a 38 yr old Caucasian female, divorced, lives in [place], has a history of depression, anxiety, ADD, GI issues like achalasia, presented to the clinic today for a follow-up visit.  Patient today presented along with her mother who provided collateral information.  Patient reports she continues to struggle with depressive symptoms like lack of motivation, low energy, excessive sleep, anhedonia, inability to take care of herself and her children and so on.  Her mother hence has been stepping in helping her and her children on a regular basis.  Patient reports she has not noticed much benefit from the Pristiq and hence wants to get off of all medications if possible.She has been on the Pristiq since the past 3 weeks.  Patient reports she would like to get off of the Abilify the Pristiq and all the other medications.  She reports she wants to go on a drug holiday and then start all over again if needed.  Patient reports she wants to continue the Adderall for her ADD symptoms.  She denies any suicidality, homicidality or perceptual disturbances.  Per collateral information obtained from mother patient used to be high functioning several years ago and at that time all she took was the Adderall.  She however has not done well with any of the antidepressants and has been getting worse and worse with antidepressants and mood stabilizers.  Discussed with patient about referral for TMS or ECT.  Patient reports she is interested in TMS.  Hence will make the referral today.     Visit Diagnosis:    ICD-10-CM   1. MDD (major depressive disorder), recurrent episode, moderate (HCC) F33.1 Desvenlafaxine Succinate ER (PRISTIQ) 25 MG TB24  2. GAD (generalized anxiety disorder) F41.1   3. Attention deficit hyperactivity  disorder (ADHD), predominantly inattentive type F90.0   4. Insomnia due to mental disorder F51.05     Past Psychiatric History: Reviewed past psychiatric history from my progress note on 04/14/2018.  Past trials of Cymbalta, Celexa, Wellbutrin, Pristiq, Effexor, Lamictal, Adderall, Ritalin, Abilify,BZD,Prozac.  Past Medical History:  Past Medical History:  Diagnosis Date  . ADHD (attention deficit hyperactivity disorder)   . Anxiety   . Depression     Past Surgical History:  Procedure Laterality Date  . ABDOMINAL SURGERY    . CHOLECYSTECTOMY    . ESOPHAGECTOMY      Family Psychiatric History: I have reviewed family psychiatric history from my progress note on 04/14/2018.  Family History:  Family History  Problem Relation Age of Onset  . Anxiety disorder Mother   . Anxiety disorder Father   . ADD / ADHD Brother   . Anxiety disorder Brother   . Anxiety disorder Maternal Grandmother     Social History: Reviewed social history from my progress note on 04/14/2018. Social History   Socioeconomic History  . Marital status: Single    Spouse name: Not on file  . Number of children: Not on file  . Years of education: Not on file  . Highest education level: Not on file  Occupational History  . Not on file  Social Needs  . Financial resource strain: Not hard at all  . Food insecurity:    Worry: Never true    Inability: Never true  . Transportation needs:  Medical: No    Non-medical: No  Tobacco Use  . Smoking status: Never Smoker  . Smokeless tobacco: Never Used  Substance and Sexual Activity  . Alcohol use: No  . Drug use: Not Currently    Types: Marijuana    Comment: last used 6 mths ago  . Sexual activity: Yes    Partners: Male    Birth control/protection: Condom  Lifestyle  . Physical activity:    Days per week: 0 days    Minutes per session: 0 min  . Stress: Not on file  Relationships  . Social connections:    Talks on phone: Not on file    Gets together:  Not on file    Attends religious service: Never    Active member of club or organization: No    Attends meetings of clubs or organizations: Never    Relationship status: Not on file  Other Topics Concern  . Not on file  Social History Narrative  . Not on file    Allergies:  Allergies  Allergen Reactions  . Fentanyl Hives  . Metoclopramide Other (See Comments)  . Morphine Hives  . Butalbital-Apap-Caffeine Other (See Comments)    Paranoia  . Diazepam Other (See Comments)  . Penicillins   . Promethazine     seizures  . Latex Rash  . Lorazepam Other (See Comments)    combative    Metabolic Disorder Labs: No results found for: HGBA1C, MPG No results found for: PROLACTIN No results found for: CHOL, TRIG, HDL, CHOLHDL, VLDL, LDLCALC Lab Results  Component Value Date   TSH 0.733 06/19/2010   TSH 0.875 09/27/2009    Therapeutic Level Labs: No results found for: LITHIUM No results found for: VALPROATE No components found for:  CBMZ  Current Medications: Current Outpatient Medications  Medication Sig Dispense Refill  . amphetamine-dextroamphetamine (ADDERALL) 10 MG tablet Take 1 tablet (10 mg total) by mouth daily after lunch. 30 tablet 0  . amphetamine-dextroamphetamine (ADDERALL) 30 MG tablet Take 1 tablet by mouth daily. 30 tablet 0  . amphetamine-dextroamphetamine (ADDERALL) 30 MG tablet Take 1 tablet by mouth daily. 30 tablet 0  . Desvenlafaxine Succinate ER (PRISTIQ) 25 MG TB24 Take 25 mg by mouth at bedtime. 10 tablet 0  . hydrOXYzine (ATARAX/VISTARIL) 25 MG tablet Take 0.5-1 tablets (12.5-25 mg total) by mouth 2 (two) times daily as needed for anxiety. 60 tablet 2  . ondansetron (ZOFRAN-ODT) 4 MG disintegrating tablet Take 8 mg by mouth.    . pantoprazole (PROTONIX) 20 MG tablet Take 40 mg by mouth.    . pantoprazole (PROTONIX) 40 MG tablet   0  . sucralfate (CARAFATE) 1 g tablet Take 1 g by mouth 4 (four) times daily.  0   No current facility-administered  medications for this visit.      Musculoskeletal: Strength & Muscle Tone: within normal limits Gait & Station: normal Patient leans: N/A  Psychiatric Specialty Exam: Review of Systems  Psychiatric/Behavioral: Positive for depression.  All other systems reviewed and are negative.   Blood pressure 97/67, pulse 84, temperature 97.7 F (36.5 C), temperature source Oral, weight 199 lb 12.8 oz (90.6 kg).Body mass index is 34.57 kg/m.  General Appearance: Casual  Eye Contact:  Fair  Speech:  Clear and Coherent  Volume:  Normal  Mood:  Depressed and Dysphoric  Affect:  Congruent  Thought Process:  Goal Directed and Descriptions of Associations: Intact  Orientation:  Full (Time, Place, and Person)  Thought Content: Logical  Suicidal Thoughts:  No  Homicidal Thoughts:  No  Memory:  Immediate;   Fair Recent;   Fair Remote;   Fair  Judgement:  Fair  Insight:  Fair  Psychomotor Activity:  Normal  Concentration:  Concentration: Fair and Attention Span: Fair  Recall:  [employer] of Knowledge: Fair  Language: Fair  Akathisia:  No  Handed:  Right  AIMS (if indicated): denies tremors, rigidity,stiffness  Assets:  Communication Skills Desire for Improvement  ADL's:  Intact  Cognition: WNL  Sleep:  Fair   Screenings:   Assessment and Plan: Dorean is a 38 yr old patient female who has a history of depression, anxiety, ADD, esophageal achalasia, presented to the clinic today for a follow-up visit.  Patient continues to struggle with depressive symptoms.  Patient does not think any of her medications as helpful and would like to go on a drug holiday.  Discussed referral for TMS or ECT.  Patient is agreeable to TMS.  Will refer her for the same.  Plan For depression-unstable Taper of Pristiq.  She will start taking 25 mg for the next 10 days and stop taking it. Discontinue Abilify. Discontinue Topamax-she reports she has been noncompliant.  For  anxiety  disorder-unstable Hydroxyzine as needed.  For ADD-improving Adderall 30 mg p.o. daily. Adderall 10 mg p.o. after lunch  Advised to start psychotherapy session with Ms. Heidi Dach here in clinic.  She has her first appointment on 10 February.  Patient had Gene[employer] testing done and medication changes were made based on testing however she reports none of the medications as beneficial.  Will refer patient for TMS therapy-referred her to Ms. Edmonia [facility] at [facility].  Follow-up in clinic in 10 days or sooner if needed.  I have spent atleast 15 minutes  face to face with patient today. More than 50 % of the time was spent for psychoeducation and supportive psychotherapy and care coordination.  This note was generated in part or whole with voice recognition software. Voice recognition is usually quite accurate but there are transcription errors that can and very often do occur. I apologize for any typographical errors that were not detected and corrected.      Jomarie Longs, MD 09/16/2018, 2:09 PM

## 2018-09-20 ENCOUNTER — Ambulatory Visit: Payer: Medicare Other | Admitting: Licensed Clinical Social Worker

## 2018-09-23 ENCOUNTER — Other Ambulatory Visit: Payer: Self-pay | Admitting: Psychiatry

## 2018-09-29 ENCOUNTER — Encounter: Payer: Self-pay | Admitting: Psychiatry

## 2018-09-29 ENCOUNTER — Other Ambulatory Visit: Payer: Self-pay

## 2018-09-29 ENCOUNTER — Ambulatory Visit (INDEPENDENT_AMBULATORY_CARE_PROVIDER_SITE_OTHER): Payer: Medicare Other | Admitting: Psychiatry

## 2018-09-29 VITALS — BP 102/73 | HR 76 | Temp 97.7°F | Wt 195.8 lb

## 2018-09-29 DIAGNOSIS — F331 Major depressive disorder, recurrent, moderate: Secondary | ICD-10-CM | POA: Diagnosis not present

## 2018-09-29 DIAGNOSIS — F411 Generalized anxiety disorder: Secondary | ICD-10-CM | POA: Diagnosis not present

## 2018-09-29 DIAGNOSIS — F5105 Insomnia due to other mental disorder: Secondary | ICD-10-CM | POA: Diagnosis not present

## 2018-09-29 DIAGNOSIS — F900 Attention-deficit hyperactivity disorder, predominantly inattentive type: Secondary | ICD-10-CM | POA: Diagnosis not present

## 2018-09-29 MED ORDER — MELATONIN 3 MG PO TABS: 3.0000 mg | ORAL_TABLET | Freq: Every day | ORAL | 0 refills | Status: DC

## 2018-09-29 NOTE — Progress Notes (Signed)
BH MD OP Progress Note  09/29/2018 5:17 PM [name] [name]  MRN:  [id number]  Chief Complaint: ' I am here for follow up." Chief Complaint    Follow-up; Medication Refill     HPI: [name] [name] is a 38 year old Caucasian female, divorced, lives in [place], on [place], has a history of depression, anxiety, ADD, GI issues like achalasia, presented to clinic today for a follow-up visit.   Patient today appeared to be pleasant.  Patient reports she stopped the Pristiq cold [place] and did not wean it off.  Patient reports she feels much better without her Abilify and Pristiq, does not feel foggy anymore.  She reports she feels motivated and is able to take a shower, do the chores around the house and take care of her children better than before.  Patient however reports she has sleep issues .  She reports it started after she stopped the Pristiq and she had mild withdrawal symptoms from stopping the Pristiq for a few days.  Her sleep problems started then and it has continued.  She has been taking hydroxyzine as needed which helps to get at least 3 to 4 hours at night.  Discussed adding melatonin along with the hydroxyzine and she agrees with plan.  Discussed starting psychotherapy sessions and patient agrees with plan.  Patient has upcoming appointment with [name] [name].  She continues to be compliant with her ADHD medications.  Called patient's mother Ms. [name] [name] to get collateral information.  Per mother patient definitely is making a lot of progress since being off of the Abilify and Pristiq.  Visit Diagnosis:    ICD-10-CM   1. MDD (major depressive disorder), recurrent episode, moderate (HCC) F33.1 Melatonin 3 MG TABS   improving  2. GAD (generalized anxiety disorder) F41.1   3. Attention deficit hyperactivity disorder (ADHD), predominantly inattentive type F90.0   4. Insomnia due to mental condition F51.05     Past Psychiatric History:   Have reviewed past psychiatric history from my progress  note on 04/14/2018.  Past trials of Cymbalta, Celexa, Wellbutrin, Pristiq, Effexor, Lamictal, Adderall, Ritalin, Abilify, benzodiazepine, Prozac.      Past Medical History:  Past Medical History:  Diagnosis Date  . ADHD (attention deficit hyperactivity disorder)   . Anxiety   . Depression     Past Surgical History:  Procedure Laterality Date  . ABDOMINAL SURGERY    . CHOLECYSTECTOMY    . ESOPHAGECTOMY      Family Psychiatric History: Reviewed family psychiatric history from my progress note on 04/14/2018  Family History:  Family History  Problem Relation Age of Onset  . Anxiety disorder Mother   . Anxiety disorder Father   . ADD / ADHD Brother   . Anxiety disorder Brother   . Anxiety disorder Maternal Grandmother     Social History: Reviewed social history from my progress note on 04/14/2018. Social History   Socioeconomic History  . Marital status: Single    Spouse name: Not on file  . Number of children: Not on file  . Years of education: Not on file  . Highest education level: Not on file  Occupational History  . Not on file  Social Needs  . Financial resource strain: Not hard at all  . Food insecurity:    Worry: Never true    Inability: Never true  . Transportation needs:    Medical: No    Non-medical: No  Tobacco Use  . Smoking status: Never Smoker  . Smokeless tobacco: Never Used  Substance and Sexual Activity  . Alcohol use: No  . Drug use: Not Currently    Types: Marijuana    Comment: last used 6 mths ago  . Sexual activity: Yes    Partners: Male    Birth control/protection: Condom  Lifestyle  . Physical activity:    Days per week: 0 days    Minutes per session: 0 min  . Stress: Not on file  Relationships  . Social connections:    Talks on phone: Not on file    Gets together: Not on file    Attends religious service: Never    Active member of club or organization: No    Attends meetings of clubs or organizations: Never    Relationship status:  Not on file  Other Topics Concern  . Not on file  Social History Narrative  . Not on file    Allergies:  Allergies  Allergen Reactions  . Fentanyl Hives  . Metoclopramide Other (See Comments)  . Morphine Hives  . Butalbital-Apap-Caffeine Other (See Comments)    Paranoia  . Diazepam Other (See Comments)  . Penicillins   . Promethazine     seizures  . Latex Rash  . Lorazepam Other (See Comments)    combative    Metabolic Disorder Labs: No results found for: HGBA1C, MPG No results found for: PROLACTIN No results found for: CHOL, TRIG, HDL, CHOLHDL, VLDL, LDLCALC Lab Results  Component Value Date   TSH 0.733 06/19/2010   TSH 0.875 09/27/2009    Therapeutic Level Labs: No results found for: LITHIUM No results found for: VALPROATE No components found for:  CBMZ  Current Medications: Current Outpatient Medications  Medication Sig Dispense Refill  . amphetamine-dextroamphetamine (ADDERALL) 10 MG tablet Take 1 tablet (10 mg total) by mouth daily after lunch. 30 tablet 0  . amphetamine-dextroamphetamine (ADDERALL) 30 MG tablet Take 1 tablet by mouth daily. 30 tablet 0  . amphetamine-dextroamphetamine (ADDERALL) 30 MG tablet Take 1 tablet by mouth daily. 30 tablet 0  . hydrOXYzine (ATARAX/VISTARIL) 25 MG tablet Take 0.5-1 tablets (12.5-25 mg total) by mouth 2 (two) times daily as needed for anxiety. 60 tablet 2  . ondansetron (ZOFRAN-ODT) 4 MG disintegrating tablet Take 8 mg by mouth.    . pantoprazole (PROTONIX) 20 MG tablet Take 40 mg by mouth.    . pantoprazole (PROTONIX) 40 MG tablet   0  . sucralfate (CARAFATE) 1 g tablet Take 1 g by mouth 4 (four) times daily.  0  . Melatonin 3 MG TABS Take 1 tablet (3 mg total) by mouth at bedtime. 100 tablet 0   No current facility-administered medications for this visit.      Musculoskeletal: Strength & Muscle Tone: within normal limits Gait & Station: normal Patient leans: N/A  Psychiatric Specialty Exam: Review of Systems   Psychiatric/Behavioral: The patient has insomnia.   All other systems reviewed and are negative.   Blood pressure 102/73, pulse 76, temperature 97.7 F (36.5 C), temperature source Oral, weight 195 lb 12.8 oz (88.8 kg).Body mass index is 33.87 kg/m.  General Appearance: Casual  Eye Contact:  Fair  Speech:  Clear and Coherent  Volume:  Normal  Mood:  Dysphoric improving  Affect:  Congruent  Thought Process:  Goal Directed and Descriptions of Associations: Intact  Orientation:  Full (Time, Place, and Person)  Thought Content: Logical   Suicidal Thoughts:  No  Homicidal Thoughts:  No  Memory:  Immediate;   Fair Recent;   Fair Remote;  Fair  Judgement:  Fair  Insight:  Fair  Psychomotor Activity:  Normal  Concentration:  Concentration: Fair and Attention Span: Fair  Recall:  [employer]  Fund of Knowledge: Fair  Language: Fair  Akathisia:  No  Handed:  Right  AIMS (if indicated):na  Assets:  Communication Skills Desire for Improvement Social Support  ADL's:  Intact  Cognition: WNL  Sleep:  Poor   Screenings:   Assessment and Plan: Lara MulchShellis is a 38 yr old female who has a history of depression, anxiety, ADD, esophageal achalasia, presented to clinic today for a follow-up visit.  Patient is currently off of all medications except for her Adderall.  Pt today reports she is making progress on the current medication regimen.  She however has sleep problems.  Will continue plan as noted below.  Plan For depression-improving Will not make any changes today. Refer for CBT.  For insomnia- unstable Continue hydroxyzine as needed Add melatonin 3 mg p.o. nightly Discussed sleep hygiene techniques   For anxiety disorder-improving Hydroxyzine as needed.  For ADHD-improving Adderall 30 mg p.o. daily Adderall 10 mg p.o. after lunch.  Patient was referred for TMS- patient declined  Patient has been referred for CBT with [name] [name], has upcoming appointment.  I have obtained  collateral information from patient's mother-[name] [name] as summarized above.  Patient to follow-up in clinic in 2 weeks or sooner if needed.  I have spent atleast 15 minutes face to face with patient today. More than 50 % of the time was spent for psychoeducation and supportive psychotherapy and care coordination.  This note was generated in part or whole with voice recognition software. Voice recognition is usually quite accurate but there are transcription errors that can and very often do occur. I apologize for any typographical errors that were not detected and corrected.        Jomarie LongsSaramma Rithy Mandley, MD 09/29/2018, 5:17 PM

## 2018-10-07 ENCOUNTER — Ambulatory Visit: Payer: Medicare Other | Admitting: Licensed Clinical Social Worker

## 2018-10-13 ENCOUNTER — Telehealth: Payer: Self-pay

## 2018-10-13 ENCOUNTER — Ambulatory Visit: Payer: Medicare Other | Admitting: Psychiatry

## 2018-10-13 DIAGNOSIS — F900 Attention-deficit hyperactivity disorder, predominantly inattentive type: Secondary | ICD-10-CM

## 2018-10-13 NOTE — Telephone Encounter (Signed)
pt called states she needs enought medication sent to pharmacy to get to next appt for adderall. hydroxyzine.

## 2018-10-15 MED ORDER — AMPHETAMINE-DEXTROAMPHETAMINE 10 MG PO TABS: 10.0000 mg | ORAL_TABLET | Freq: Every day | ORAL | 0 refills | Status: DC

## 2018-10-15 MED ORDER — HYDROXYZINE HCL 25 MG PO TABS: 12.5000 mg | ORAL_TABLET | Freq: Two times a day (BID) | ORAL | 2 refills | Status: DC | PRN

## 2018-10-15 MED ORDER — AMPHETAMINE-DEXTROAMPHETAMINE 30 MG PO TABS: 30.0000 mg | ORAL_TABLET | Freq: Every day | ORAL | 0 refills | Status: DC

## 2018-10-15 NOTE — Telephone Encounter (Signed)
Sent adderall and vistaril to pharmacy

## 2018-10-19 ENCOUNTER — Ambulatory Visit (INDEPENDENT_AMBULATORY_CARE_PROVIDER_SITE_OTHER): Payer: Medicare Other | Admitting: Psychiatry

## 2018-10-19 ENCOUNTER — Encounter: Payer: Self-pay | Admitting: Psychiatry

## 2018-10-19 VITALS — BP 93/68 | HR 86 | Temp 98.8°F | Wt 197.0 lb

## 2018-10-19 DIAGNOSIS — F900 Attention-deficit hyperactivity disorder, predominantly inattentive type: Secondary | ICD-10-CM | POA: Diagnosis not present

## 2018-10-19 DIAGNOSIS — F411 Generalized anxiety disorder: Secondary | ICD-10-CM

## 2018-10-19 DIAGNOSIS — F5105 Insomnia due to other mental disorder: Secondary | ICD-10-CM

## 2018-10-19 DIAGNOSIS — F331 Major depressive disorder, recurrent, moderate: Secondary | ICD-10-CM | POA: Diagnosis not present

## 2018-10-19 NOTE — Progress Notes (Signed)
BH MD OP Progress Note  10/19/2018 5:18 PM [name] [name]  MRN:  [id number]  Chief Complaint: ' I am here for follow up." Chief Complaint    Follow-up     HPI: [name] [name] is a 38 year old Caucasian female, divorced, lives in [place], on [place], has a history of depression, anxiety, ADD, GI issues like achalasia, presented to clinic today for a follow-up visit.  Patient today presented 15 minutes late for her appointment.  And reports she missed her last appointment because she had the flu.  Patient today reports she is only taking her ADHD medication and is currently doing well.  She reports there are times when she feels overwhelmed because of all the situational stressors.  She however has been able to cope better.  Patient reports sleep is better even without the melatonin.  Patient reports she looks forward to starting psychotherapy sessions with Joni Reining.  She has upcoming appointment scheduled.  She reports her mother continues to be very supportive.  Patient denies any suicidality, homicidality or perceptual disturbances. Visit Diagnosis:    ICD-10-CM   1. MDD (major depressive disorder), recurrent episode, moderate (HCC) F33.1   2. GAD (generalized anxiety disorder) F41.1   3. Attention deficit hyperactivity disorder (ADHD), predominantly inattentive type F90.0   4. Insomnia due to mental condition F51.05     Past Psychiatric History: I have reviewed past psychiatric history from my progress note on 04/14/2018.  Past trials of Cymbalta, Celexa, Wellbutrin, Pristiq, Effexor, Lamictal, Adderall, Ritalin, Abilify, benzodiazepines, Prozac.  Past Medical History:  Past Medical History:  Diagnosis Date  . ADHD (attention deficit hyperactivity disorder)   . Anxiety   . Depression     Past Surgical History:  Procedure Laterality Date  . ABDOMINAL SURGERY    . CHOLECYSTECTOMY    . ESOPHAGECTOMY      Family Psychiatric History: I have reviewed family psychiatric history from my  progress note on 04/14/2018.  Family History:  Family History  Problem Relation Age of Onset  . Anxiety disorder Mother   . Anxiety disorder Father   . ADD / ADHD Brother   . Anxiety disorder Brother   . Anxiety disorder Maternal Grandmother     Social History: Reviewed social history from my progress note on 04/14/2018. Social History   Socioeconomic History  . Marital status: Single    Spouse name: Not on file  . Number of children: Not on file  . Years of education: Not on file  . Highest education level: Not on file  Occupational History  . Not on file  Social Needs  . Financial resource strain: Not hard at all  . Food insecurity:    Worry: Never true    Inability: Never true  . Transportation needs:    Medical: No    Non-medical: No  Tobacco Use  . Smoking status: Never Smoker  . Smokeless tobacco: Never Used  Substance and Sexual Activity  . Alcohol use: No  . Drug use: Not Currently    Types: Marijuana    Comment: last used 6 mths ago  . Sexual activity: Yes    Partners: Male    Birth control/protection: Condom  Lifestyle  . Physical activity:    Days per week: 0 days    Minutes per session: 0 min  . Stress: Not on file  Relationships  . Social connections:    Talks on phone: Not on file    Gets together: Not on file    Attends religious  service: Never    Active member of club or organization: No    Attends meetings of clubs or organizations: Never    Relationship status: Not on file  Other Topics Concern  . Not on file  Social History Narrative  . Not on file    Allergies:  Allergies  Allergen Reactions  . Fentanyl Hives  . Metoclopramide Other (See Comments)  . Morphine Hives  . Butalbital-Apap-Caffeine Other (See Comments)    Paranoia  . Diazepam Other (See Comments)  . Penicillins   . Promethazine     seizures  . Latex Rash  . Lorazepam Other (See Comments)    combative    Metabolic Disorder Labs: No results found for: HGBA1C,  MPG No results found for: PROLACTIN No results found for: CHOL, TRIG, HDL, CHOLHDL, VLDL, LDLCALC Lab Results  Component Value Date   TSH 0.733 06/19/2010   TSH 0.875 09/27/2009    Therapeutic Level Labs: No results found for: LITHIUM No results found for: VALPROATE No components found for:  CBMZ  Current Medications: Current Outpatient Medications  Medication Sig Dispense Refill  . amphetamine-dextroamphetamine (ADDERALL) 10 MG tablet Take 1 tablet (10 mg total) by mouth daily after lunch. 30 tablet 0  . amphetamine-dextroamphetamine (ADDERALL) 30 MG tablet Take 1 tablet by mouth daily. 30 tablet 0  . amphetamine-dextroamphetamine (ADDERALL) 30 MG tablet Take 1 tablet by mouth daily. 30 tablet 0  . hydrOXYzine (ATARAX/VISTARIL) 25 MG tablet Take 0.5-1 tablets (12.5-25 mg total) by mouth 2 (two) times daily as needed for anxiety. 60 tablet 2  . Melatonin 3 MG TABS Take 1 tablet (3 mg total) by mouth at bedtime. 100 tablet 0  . ondansetron (ZOFRAN-ODT) 4 MG disintegrating tablet Take 8 mg by mouth.    . oseltamivir (TAMIFLU) 75 MG capsule TAKE 1 CAPSULE BY MOUTH FOR 10 DAYS    . pantoprazole (PROTONIX) 20 MG tablet Take 40 mg by mouth.    . pantoprazole (PROTONIX) 40 MG tablet   0  . sucralfate (CARAFATE) 1 g tablet Take 1 g by mouth 4 (four) times daily.  0   No current facility-administered medications for this visit.      Musculoskeletal: Strength & Muscle Tone: within normal limits Gait & Station: normal Patient leans: N/A  Psychiatric Specialty Exam: Review of Systems  Psychiatric/Behavioral: The patient is not nervous/anxious.   All other systems reviewed and are negative.   Blood pressure 93/68, pulse 86, temperature 98.8 F (37.1 C), temperature source Oral, weight 197 lb (89.4 kg).Body mass index is 34.08 kg/m.  General Appearance: Casual  Eye Contact:  Fair  Speech:  Normal Rate  Volume:  Normal  Mood:  Euthymic  Affect:  Appropriate  Thought Process:  Goal  Directed and Descriptions of Associations: Intact  Orientation:  Full (Time, Place, and Person)  Thought Content: Logical   Suicidal Thoughts:  No  Homicidal Thoughts:  No  Memory:  Immediate;   Fair Recent;   Fair Remote;   Fair  Judgement:  Fair  Insight:  Good  Psychomotor Activity:  Normal  Concentration:  Concentration: Fair and Attention Span: Fair  Recall:  [employer] of Knowledge: Fair  Language: Fair  Akathisia:  No  Handed:  Right  AIMS (if indicated): Denies tremors, rigidity, stiffness  Assets:  Communication Skills Desire for Improvement Social Support  ADL's:  Intact  Cognition: WNL  Sleep:  Fair   Screenings:   Assessment and Plan: [name] [name] is a 38 year old Caucasian female  who has a history of depression, anxiety, ADD, esophageal achalasia, presented to clinic today for a follow-up visit.  Patient is currently making progress on the Adderall with regards to her ADHD symptoms.  She is wanting to stay off of antidepressants and antianxiety agents and wants to pursue psychotherapy sessions.  Plan as noted below.  Plan For depression-improving Referred for CBT  For insomnia-stable Continue melatonin as needed.  For anxiety disorder-improving Hydroxyzine as needed  For ADHD-improving Adderall 30 mg p.o. daily Adderall 10 mg p.o. after lunch.  Follow-up in clinic in 1 month or sooner if needed.  I have spent atleast 15 minutes face to face with patient today. More than 50 % of the time was spent for psychoeducation and supportive psychotherapy and care coordination.  This note was generated in part or whole with voice recognition software. Voice recognition is usually quite accurate but there are transcription errors that can and very often do occur. I apologize for any typographical errors that were not detected and corrected.        Jomarie Longs, MD 10/20/2018, 9:03 AM

## 2018-10-20 ENCOUNTER — Encounter: Payer: Self-pay | Admitting: Psychiatry

## 2018-11-04 ENCOUNTER — Ambulatory Visit: Payer: Medicare Other | Admitting: Licensed Clinical Social Worker

## 2018-11-16 ENCOUNTER — Other Ambulatory Visit: Payer: Self-pay

## 2018-11-16 ENCOUNTER — Ambulatory Visit (INDEPENDENT_AMBULATORY_CARE_PROVIDER_SITE_OTHER): Payer: Medicare Other | Admitting: Psychiatry

## 2018-11-16 ENCOUNTER — Encounter: Payer: Self-pay | Admitting: Psychiatry

## 2018-11-16 DIAGNOSIS — F5105 Insomnia due to other mental disorder: Secondary | ICD-10-CM

## 2018-11-16 DIAGNOSIS — F331 Major depressive disorder, recurrent, moderate: Secondary | ICD-10-CM | POA: Diagnosis not present

## 2018-11-16 DIAGNOSIS — F900 Attention-deficit hyperactivity disorder, predominantly inattentive type: Secondary | ICD-10-CM | POA: Diagnosis not present

## 2018-11-16 DIAGNOSIS — F411 Generalized anxiety disorder: Secondary | ICD-10-CM | POA: Diagnosis not present

## 2018-11-16 MED ORDER — AMPHETAMINE-DEXTROAMPHETAMINE 10 MG PO TABS: 10.0000 mg | ORAL_TABLET | Freq: Every day | ORAL | 0 refills | Status: DC

## 2018-11-16 MED ORDER — AMPHETAMINE-DEXTROAMPHETAMINE 30 MG PO TABS: 30.0000 mg | ORAL_TABLET | Freq: Every day | ORAL | 0 refills | Status: DC

## 2018-11-16 NOTE — Progress Notes (Signed)
Virtual Visit via Telephone Note  I connected with [name] OrisShelli N [name] on 11/16/18 at  2:15 PM EDT by telephone and verified that I am speaking with the correct person using two identifiers.   I discussed the limitations, risks, security and privacy concerns of performing an evaluation and management service by telephone and the availability of in person appointments. I also discussed with the patient that there may be a patient responsible charge related to this service. The patient expressed understanding and agreed to proceed.    I discussed the assessment and treatment plan with the patient. The patient was provided an opportunity to ask questions and all were answered. The patient agreed with the plan and demonstrated an understanding of the instructions.   The patient was advised to call back or seek an in-person evaluation if the symptoms worsen or if the condition fails to improve as anticipated.  I provided 15 minutes of non-face-to-face time during this encounter.   Jomarie LongsSaramma Waymon Laser, MD  BH MD  OP Progress Note  11/16/2018 2:32 PM [name] [name]  MRN:  [id number]  Chief Complaint:  Chief Complaint    Follow-up; Medication Refill     HPI: [name] [name] is a 38 year old Caucasian female, divorced, lives in [place], [place], has a history of depression, anxiety, ADD, GI issues like achalasia was evaluated by telemedicine today.  Patient today was offered a video consult however had difficulty connecting and preferred to do a telephone consult.  Patient reports she is staying home with her children majority of time due to the COVID-19 outbreak.  Her children are currently being homeschooled.  She reports she is able to take care of that and manage things by self.  Her mother does help out and checks on her regularly.  Her mother also helps with grocery shopping.  Her mother is a huge support system for her.  Patient reports her sleep has improved since her last visit here.  She reports she  also has noticed more motivation and ability to concentrate.  She has been taking hydroxyzine as needed for anxiety symptoms which helps.  Patient was referred for psychotherapy sessions however reports due to the coronavirus outbreak she had to cancel her initial appointment.  Discussed with patient to call the front desk to schedule an appointment as soon as possible since her therapist is currently offering telemedicine consults.  She agrees with plan.  Patient denies any suicidality, homicidality or perceptual disturbances.  Visit Diagnosis:    ICD-10-CM   1. MDD (major depressive disorder), recurrent episode, moderate (HCC) F33.1    improved  2. GAD (generalized anxiety disorder) F41.1    improved  3. Attention deficit hyperactivity disorder (ADHD), predominantly inattentive type F90.0 amphetamine-dextroamphetamine (ADDERALL) 30 MG tablet    amphetamine-dextroamphetamine (ADDERALL) 30 MG tablet    amphetamine-dextroamphetamine (ADDERALL) 10 MG tablet    amphetamine-dextroamphetamine (ADDERALL) 10 MG tablet  4. Insomnia due to mental condition F51.05    improved    Past Psychiatric History: I have reviewed past psychiatric history from my progress note on 04/14/2018.  Past trials of Cymbalta, Celexa, Wellbutrin, Pristiq, Effexor, Lamictal, Adderall, Ritalin, Abilify, benzodiazepines, Prozac  Past Medical History:  Past Medical History:  Diagnosis Date  . ADHD (attention deficit hyperactivity disorder)   . Anxiety   . Depression     Past Surgical History:  Procedure Laterality Date  . ABDOMINAL SURGERY    . CHOLECYSTECTOMY    . ESOPHAGECTOMY      Family Psychiatric History: I have  reviewed family psychiatric history from my progress note on 04/14/2018  Family History:  Family History  Problem Relation Age of Onset  . Anxiety disorder Mother   . Anxiety disorder Father   . ADD / ADHD Brother   . Anxiety disorder Brother   . Anxiety disorder Maternal Grandmother      Social History: Reviewed social history from my progress note on 04/14/2018 Social History   Socioeconomic History  . Marital status: Single    Spouse name: Not on file  . Number of children: Not on file  . Years of education: Not on file  . Highest education level: Not on file  Occupational History  . Not on file  Social Needs  . Financial resource strain: Not hard at all  . Food insecurity:    Worry: Never true    Inability: Never true  . Transportation needs:    Medical: No    Non-medical: No  Tobacco Use  . Smoking status: Never Smoker  . Smokeless tobacco: Never Used  Substance and Sexual Activity  . Alcohol use: No  . Drug use: Not Currently    Types: Marijuana    Comment: last used 6 mths ago  . Sexual activity: Yes    Partners: Male    Birth control/protection: Condom  Lifestyle  . Physical activity:    Days per week: 0 days    Minutes per session: 0 min  . Stress: Not on file  Relationships  . Social connections:    Talks on phone: Not on file    Gets together: Not on file    Attends religious service: Never    Active member of club or organization: No    Attends meetings of clubs or organizations: Never    Relationship status: Not on file  Other Topics Concern  . Not on file  Social History Narrative  . Not on file    Allergies:  Allergies  Allergen Reactions  . Fentanyl Hives  . Metoclopramide Other (See Comments)  . Morphine Hives  . Butalbital-Apap-Caffeine Other (See Comments)    Paranoia  . Diazepam Other (See Comments)  . Penicillins   . Promethazine     seizures  . Latex Rash  . Lorazepam Other (See Comments)    combative    Metabolic Disorder Labs: No results found for: HGBA1C, MPG No results found for: PROLACTIN No results found for: CHOL, TRIG, HDL, CHOLHDL, VLDL, LDLCALC Lab Results  Component Value Date   TSH 0.733 06/19/2010   TSH 0.875 09/27/2009    Therapeutic Level Labs: No results found for: LITHIUM No  results found for: VALPROATE No components found for:  CBMZ  Current Medications: Current Outpatient Medications  Medication Sig Dispense Refill  . amphetamine-dextroamphetamine (ADDERALL) 10 MG tablet Take 1 tablet (10 mg total) by mouth daily after lunch. 30 tablet 0  . amphetamine-dextroamphetamine (ADDERALL) 30 MG tablet Take 1 tablet by mouth daily. 30 tablet 0  . [START ON 12/15/2018] amphetamine-dextroamphetamine (ADDERALL) 30 MG tablet Take 1 tablet by mouth daily. 30 tablet 0  . hydrOXYzine (ATARAX/VISTARIL) 25 MG tablet Take 0.5-1 tablets (12.5-25 mg total) by mouth 2 (two) times daily as needed for anxiety. 60 tablet 2  . Melatonin 3 MG TABS Take 1 tablet (3 mg total) by mouth at bedtime. 100 tablet 0  . ondansetron (ZOFRAN-ODT) 4 MG disintegrating tablet Take 8 mg by mouth.    . oseltamivir (TAMIFLU) 75 MG capsule TAKE 1 CAPSULE BY MOUTH FOR 10 DAYS    .  pantoprazole (PROTONIX) 20 MG tablet Take 40 mg by mouth.    . pantoprazole (PROTONIX) 40 MG tablet   0  . sucralfate (CARAFATE) 1 g tablet Take 1 g by mouth 4 (four) times daily.  0  . [START ON 12/15/2018] amphetamine-dextroamphetamine (ADDERALL) 10 MG tablet Take 1 tablet (10 mg total) by mouth daily. After lunch 30 tablet 0   No current facility-administered medications for this visit.      Musculoskeletal: Strength & Muscle Tone: UTA Gait & Station: UTA Patient leans: N/A  Psychiatric Specialty Exam: Review of Systems  Psychiatric/Behavioral: The patient is nervous/anxious.   All other systems reviewed and are negative.   There were no vitals taken for this visit.There is no height or weight on file to calculate BMI.  General Appearance: UTA  Eye Contact:  UTA  Speech:  Clear and Coherent  Volume:  Normal  Mood:  Anxious  Affect:  UTA  Thought Process:  Goal Directed and Descriptions of Associations: Intact  Orientation:  Full (Time, Place, and Person)  Thought Content: Logical   Suicidal Thoughts:  No   Homicidal Thoughts:  No  Memory:  Immediate;   Fair Recent;   Fair Remote;   Fair  Judgement:  Fair  Insight:  Fair  Psychomotor Activity:  UTA  Concentration:  Concentration: Fair and Attention Span: Fair  Recall:  [employer] of Knowledge: Fair  Language: Fair  Akathisia:  No  Handed:  Right  AIMS (if indicated): Denies tremors, rigidity,stiffness  Assets:  Communication Skills Desire for Improvement Housing Social Support  ADL's:  Intact  Cognition: WNL  Sleep:  Fair   Screenings:   Assessment and Plan: Damyah is a 38 year old Caucasian female who has a history of depression, anxiety, ADD, esophageal achalasia was evaluated by telemedicine today.  Patient is currently making progress on the current medication regimen.  Discussed with patient to continue medications as well as to have more frequent psychotherapy sessions.  Plan as noted below.  Plan For depression-improved Referred for CBT, advised to have more frequent sessions.  She will call to make an appointment with Ms. Peacock  For insomnia- improving Continue melatonin as needed.  For ADHD-stable Adderall 30 mg p.o. daily Adderall 10 mg p.o. after lunch. Reviewed [place] controlled substance database.  I have given 2 prescriptions with date specified last one to be filled on 12/15/2018.  For anxiety disorder-improving Hydroxyzine as needed Continue CBT.  Follow-up in clinic in 1 to 2 months or sooner if needed.  I have spent atleast 15 minutes non face to face with patient today. More than 50 % of the time was spent for psychoeducation and supportive psychotherapy and care coordination.  This note was generated in part or whole with voice recognition software. Voice recognition is usually quite accurate but there are transcription errors that can and very often do occur. I apologize for any typographical errors that were not detected and corrected.            Jomarie Longs, MD 11/16/2018, 2:32 PM

## 2018-11-16 NOTE — Progress Notes (Signed)
11-16-18 @ 1:09. Pt allergies, and medical and surgery hx were reviewed with no changes.  Pt medications and pharmacy was reviewed with no changes. No vitals were taken because this was a phone visit.

## 2019-01-11 ENCOUNTER — Other Ambulatory Visit: Payer: Self-pay

## 2019-01-11 ENCOUNTER — Ambulatory Visit (INDEPENDENT_AMBULATORY_CARE_PROVIDER_SITE_OTHER): Payer: Medicare Other | Admitting: Psychiatry

## 2019-01-11 ENCOUNTER — Encounter: Payer: Self-pay | Admitting: Psychiatry

## 2019-01-11 DIAGNOSIS — F900 Attention-deficit hyperactivity disorder, predominantly inattentive type: Secondary | ICD-10-CM | POA: Diagnosis not present

## 2019-01-11 DIAGNOSIS — F411 Generalized anxiety disorder: Secondary | ICD-10-CM | POA: Diagnosis not present

## 2019-01-11 DIAGNOSIS — F5105 Insomnia due to other mental disorder: Secondary | ICD-10-CM

## 2019-01-11 DIAGNOSIS — F3341 Major depressive disorder, recurrent, in partial remission: Secondary | ICD-10-CM

## 2019-01-11 DIAGNOSIS — Z9114 Patient's other noncompliance with medication regimen: Secondary | ICD-10-CM

## 2019-01-11 DIAGNOSIS — Z91148 Patient's other noncompliance with medication regimen for other reason: Secondary | ICD-10-CM

## 2019-01-11 MED ORDER — AMPHETAMINE-DEXTROAMPHETAMINE 10 MG PO TABS: 10.0000 mg | ORAL_TABLET | Freq: Every day | ORAL | 0 refills | Status: DC

## 2019-01-11 MED ORDER — HYDROXYZINE HCL 25 MG PO TABS: 12.5000 mg | ORAL_TABLET | Freq: Two times a day (BID) | ORAL | 2 refills | Status: DC | PRN

## 2019-01-11 NOTE — Progress Notes (Signed)
Virtual Visit via Video Note  I connected with [name] [name] on 01/11/19 at  4:00 PM EDT by a video enabled telemedicine application and verified that I am speaking with the correct person using two identifiers.   I discussed the limitations of evaluation and management by telemedicine and the availability of in person appointments. The patient expressed understanding and agreed to proceed.   I discussed the assessment and treatment plan with the patient. The patient was provided an opportunity to ask questions and all were answered. The patient agreed with the plan and demonstrated an understanding of the instructions.   The patient was advised to call back or seek an in-person evaluation if the symptoms worsen or if the condition fails to improve as anticipated.   BH MD  OP Progress Note  01/11/2019 5:46 PM [name] [name]  MRN:  [id number]  Chief Complaint:  Chief Complaint    Follow-up     HPI: [name] [name] is a 38 year old CF, divorced [place] , has a history of ADHD, Anxiety disorder, MDD GI problems like achalasia , was evaluated by telemedicine today.  Attempted to do a video call however due to connection problem had to change it into a phone call.  Patient today reports that she is currently doing well on the current medications.  She  takes Adderall for her ADHD symptoms.  She is not on any medications except for the hydroxyzine for anxiety symptoms.    She reports her children staying home and she continues to homeschool them.  She reports she has been coping okay with the pandemic outbreak.  Her mother continues to be supportive.  Patient denies any suicidality, homicidality or perceptual disturbances.  Patient reports she was unable to make an appointment with the therapist as discussed the last few sessions.  She reports she will try to make an appointment with Joni ReiningNicole.  She otherwise denies any other concerns today.   Visit Diagnosis:    ICD-10-CM   1.  Attention deficit hyperactivity disorder (ADHD), predominantly inattentive type F90.0 amphetamine-dextroamphetamine (ADDERALL) 10 MG tablet    DISCONTINUED: amphetamine-dextroamphetamine (ADDERALL) 10 MG tablet  2. Insomnia due to mental condition F51.05   3. MDD (major depressive disorder), recurrent, in partial remission (HCC) F33.41   4. GAD (generalized anxiety disorder) F41.1 hydrOXYzine (ATARAX/VISTARIL) 25 MG tablet   stable  5. Noncompliance with medications Z91.14     Past Psychiatric History: I have reviewed past psychiatric history from my progress note on 04/14/2018. Past trials of cymbalta, celexa, wellbutrin, pristiq, effexor , lamictal, adderall, ritalin, abilify, benzodiazepines, prozac .  Past Medical History:  Past Medical History:  Diagnosis Date  . ADHD (attention deficit hyperactivity disorder)   . Anxiety   . Depression     Past Surgical History:  Procedure Laterality Date  . ABDOMINAL SURGERY    . CHOLECYSTECTOMY    . ESOPHAGECTOMY      Family Psychiatric History: I have reviewed family psychiatric history from my progress note on 04/14/2018.  Family History:  Family History  Problem Relation Age of Onset  . Anxiety disorder Mother   . Anxiety disorder Father   . ADD / ADHD Brother   . Anxiety disorder Brother   . Anxiety disorder Maternal Grandmother     Social History: I have reviewed social history from my progress notes on 04/14/2018. Social History   Socioeconomic History  . Marital status: Single    Spouse name: Not on file  .  Number of children: Not on file  . Years of education: Not on file  . Highest education level: Not on file  Occupational History  . Not on file  Social Needs  . Financial resource strain: Not hard at all  . Food insecurity:    Worry: Never true    Inability: Never true  . Transportation needs:    Medical: No    Non-medical: No  Tobacco Use  . Smoking status: Never Smoker  . Smokeless tobacco: Never Used   Substance and Sexual Activity  . Alcohol use: No  . Drug use: Not Currently    Types: Marijuana    Comment: last used 6 mths ago  . Sexual activity: Yes    Partners: Male    Birth control/protection: Condom  Lifestyle  . Physical activity:    Days per week: 0 days    Minutes per session: 0 min  . Stress: Not on file  Relationships  . Social connections:    Talks on phone: Not on file    Gets together: Not on file    Attends religious service: Never    Active member of club or organization: No    Attends meetings of clubs or organizations: Never    Relationship status: Not on file  Other Topics Concern  . Not on file  Social History Narrative  . Not on file    Allergies:  Allergies  Allergen Reactions  . Fentanyl Hives  . Metoclopramide Other (See Comments)  . Morphine Hives  . Butalbital-Apap-Caffeine Other (See Comments)    Paranoia  . Diazepam Other (See Comments)  . Penicillins   . Promethazine     seizures  . Latex Rash  . Lorazepam Other (See Comments)    combative    Metabolic Disorder Labs: No results found for: HGBA1C, MPG No results found for: PROLACTIN No results found for: CHOL, TRIG, HDL, CHOLHDL, VLDL, LDLCALC Lab Results  Component Value Date   TSH 0.733 06/19/2010   TSH 0.875 09/27/2009    Therapeutic Level Labs: No results found for: LITHIUM No results found for: VALPROATE No components found for:  CBMZ  Current Medications: Current Outpatient Medications  Medication Sig Dispense Refill  . amphetamine-dextroamphetamine (ADDERALL) 10 MG tablet Take 1 tablet (10 mg total) by mouth daily after lunch. 30 tablet 0  . [START ON 01/21/2019] amphetamine-dextroamphetamine (ADDERALL) 10 MG tablet Take 1 tablet (10 mg total) by mouth daily. After lunch 30 tablet 0  . amphetamine-dextroamphetamine (ADDERALL) 30 MG tablet Take 1 tablet by mouth daily. 30 tablet 0  . amphetamine-dextroamphetamine (ADDERALL) 30 MG tablet Take 1 tablet by mouth daily.  30 tablet 0  . hydrOXYzine (ATARAX/VISTARIL) 25 MG tablet Take 0.5-1 tablets (12.5-25 mg total) by mouth 2 (two) times daily as needed for anxiety. 60 tablet 2  . Melatonin 3 MG TABS Take 1 tablet (3 mg total) by mouth at bedtime. 100 tablet 0  . ondansetron (ZOFRAN-ODT) 4 MG disintegrating tablet Take 8 mg by mouth.    . oseltamivir (TAMIFLU) 75 MG capsule TAKE 1 CAPSULE BY MOUTH FOR 10 DAYS    . pantoprazole (PROTONIX) 20 MG tablet Take 40 mg by mouth.    . pantoprazole (PROTONIX) 40 MG tablet   0  . sucralfate (CARAFATE) 1 g tablet Take 1 g by mouth 4 (four) times daily.  0   No current facility-administered medications for this visit.      Musculoskeletal: Strength & Muscle Tone: reports WNL Gait & Station:  Reports WNL Patient leans: N/A  Psychiatric Specialty Exam: Review of Systems  Psychiatric/Behavioral: Negative for depression. The patient is not nervous/anxious.   All other systems reviewed and are negative.   There were no vitals taken for this visit.There is no height or weight on file to calculate BMI.  General Appearance: UTA  Eye Contact:  UTA  Speech:  Clear and Coherent  Volume:  Normal  Mood:  Euthymic  Affect:  UTA  Thought Process:  Goal Directed and Descriptions of Associations: Intact  Orientation:  Full (Time, Place, and Person)  Thought Content: Logical   Suicidal Thoughts:  No  Homicidal Thoughts:  No  Memory:  Immediate;   Fair Recent;   Fair Remote;   Fair  Judgement:  Fair  Insight:  Fair  Psychomotor Activity:  UTA  Concentration:  Concentration: Fair and Attention Span: Fair  Recall:  [employer] of Knowledge: Fair  Language: Fair  Akathisia:  No  Handed:  Right  AIMS (if indicated): Denies tremors, rigidity or tremors  Assets:  Communication Skills Desire for Improvement Housing Social Support Talents/Skills Transportation  ADL's:  Intact  Cognition: WNL  Sleep:  Fair   Screenings:   Assessment and Plan: [name] [name] is a 38 year  old CF who has a history of depression, anxiety, ADD, esophageal achalasia was evaluated by telemedicine today. Patient denies any concerns today.  However based on review of [place] controlled substance database as well as discussion with pharmacist patient has not picked up her most recent Adderall 30 mg prescription.  Called patient back to discuss this and patient appeared to be very vague about it and did not elaborate much.  During the session she had reported she was compliant on all her medications.  Patient likely with noncompliance with medications.  We will continue to monitor closely.  Plan as noted below.  Plan  For ADD- stable Adderall 30 mg po daily- she has pending script at pharmacy. Adderall 10 mg po daily- provided 1 script. Will provide scripts with date specified.[place].  For MDD -in remission Will monitor closely.  For GAD- stable Patient was referred fro CBT - noncompliant  For noncompliance with treatment-unstable Based on [place] controlled substance database patient has not been taking her Adderall 30 mg.  However denied this when Clinical research associate discussed this during session.  However verified with pharmacist and call patient back to discuss.  Will monitor closely.  Follow-up in clinic in 2 months or sooner if needed.  August 4 at 2 PM  I have spent atleast 15 minutes non face to face with patient today. More than 50 % of the time was spent for psychoeducation and supportive psychotherapy and care coordination.  This note was generated in part or whole with voice recognition software. Voice recognition is usually quite accurate but there are transcription errors that can and very often do occur. I apologize for any typographical errors that were not detected and corrected.        Jomarie Longs, MD 01/11/2019, 5:46 PM

## 2019-03-02 ENCOUNTER — Telehealth: Payer: Self-pay

## 2019-03-02 ENCOUNTER — Other Ambulatory Visit (HOSPITAL_COMMUNITY): Payer: Self-pay | Admitting: Psychiatry

## 2019-03-02 DIAGNOSIS — F900 Attention-deficit hyperactivity disorder, predominantly inattentive type: Secondary | ICD-10-CM

## 2019-03-02 MED ORDER — AMPHETAMINE-DEXTROAMPHETAMINE 30 MG PO TABS: 30.0000 mg | ORAL_TABLET | Freq: Every day | ORAL | 0 refills | Status: DC

## 2019-03-02 MED ORDER — AMPHETAMINE-DEXTROAMPHETAMINE 10 MG PO TABS: 10.0000 mg | ORAL_TABLET | Freq: Every day | ORAL | 0 refills | Status: DC

## 2019-03-02 NOTE — Telephone Encounter (Signed)
pt called wanted to make sure she gets her medication today.

## 2019-03-02 NOTE — Telephone Encounter (Signed)
ordered

## 2019-03-02 NOTE — Telephone Encounter (Signed)
pt has appt for  03-15-19 and needs refills on both adderalls.

## 2019-03-15 ENCOUNTER — Encounter: Payer: Self-pay | Admitting: Psychiatry

## 2019-03-15 ENCOUNTER — Ambulatory Visit (INDEPENDENT_AMBULATORY_CARE_PROVIDER_SITE_OTHER): Payer: Medicare Other | Admitting: Psychiatry

## 2019-03-15 ENCOUNTER — Other Ambulatory Visit: Payer: Self-pay

## 2019-03-15 DIAGNOSIS — Z91148 Patient's other noncompliance with medication regimen for other reason: Secondary | ICD-10-CM | POA: Insufficient documentation

## 2019-03-15 DIAGNOSIS — F900 Attention-deficit hyperactivity disorder, predominantly inattentive type: Secondary | ICD-10-CM | POA: Diagnosis not present

## 2019-03-15 DIAGNOSIS — Z9114 Patient's other noncompliance with medication regimen: Secondary | ICD-10-CM | POA: Diagnosis not present

## 2019-03-15 DIAGNOSIS — F3341 Major depressive disorder, recurrent, in partial remission: Secondary | ICD-10-CM | POA: Diagnosis not present

## 2019-03-15 DIAGNOSIS — F5105 Insomnia due to other mental disorder: Secondary | ICD-10-CM

## 2019-03-15 MED ORDER — AMPHETAMINE-DEXTROAMPHETAMINE 10 MG PO TABS: 10.0000 mg | ORAL_TABLET | Freq: Every day | ORAL | 0 refills | Status: DC

## 2019-03-15 MED ORDER — AMPHETAMINE-DEXTROAMPHETAMINE 30 MG PO TABS: 30.0000 mg | ORAL_TABLET | Freq: Every day | ORAL | 0 refills | Status: DC
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
## 2019-04-25 ENCOUNTER — Ambulatory Visit (INDEPENDENT_AMBULATORY_CARE_PROVIDER_SITE_OTHER): Payer: Medicare Other | Admitting: Psychiatry

## 2019-04-25 ENCOUNTER — Encounter: Payer: Self-pay | Admitting: Psychiatry

## 2019-04-25 ENCOUNTER — Other Ambulatory Visit: Payer: Self-pay

## 2019-04-25 DIAGNOSIS — F900 Attention-deficit hyperactivity disorder, predominantly inattentive type: Secondary | ICD-10-CM

## 2019-04-25 DIAGNOSIS — F411 Generalized anxiety disorder: Secondary | ICD-10-CM

## 2019-04-25 DIAGNOSIS — F3341 Major depressive disorder, recurrent, in partial remission: Secondary | ICD-10-CM | POA: Diagnosis not present

## 2019-04-25 NOTE — Progress Notes (Signed)
Virtual Visit via Video Note  I connected with [name] [name] July on 04/25/19 at  2:00 PM EDT by a video enabled telemedicine application and verified that I am speaking with the correct person using two identifiers.   I discussed the limitations of evaluation and management by telemedicine and the availability of in person appointments. The patient expressed understanding and agreed to proceed.  I discussed the assessment and treatment plan with the patient. The patient was provided an opportunity to ask questions and all were answered. The patient agreed with the plan and demonstrated an understanding of the instructions.   The patient was advised to call back or seek an in-person evaluation if the symptoms worsen or if the condition fails to improve as anticipated.   BH MD OP Progress Note  04/25/2019 5:01 PM [name] [name] Mini  MRN:  [id number]  Chief Complaint:  Chief Complaint    Follow-up     HPI: [name] [name] is a 38 year old Caucasian female, divorced, lives in [place], on [place] was evaluated by telemedicine today.  Patient with history of ADHD, MDD in remission, GI problems like achalasia.  Patient today reports she is compliant on her medications as prescribed.  She takes Adderall for ADHD symptoms.  She denies any side effects.  She reports she is able to focus and concentrate.  She is able to do chores around the house and also help children with home schooling.  She reports she is sleeping okay.  Patient denies any suicidality, homicidality or perceptual disturbances.  She reports she otherwise is doing okay at this time.  Patient has one pending prescription at the pharmacy.  She will pick it up and let writer know when she is due for refills.  She denies any other concerns today. Visit Diagnosis:    ICD-10-CM   1. Attention deficit hyperactivity disorder (ADHD), predominantly inattentive type  F90.0   2. MDD (major depressive disorder), recurrent, in partial remission (HCC)   F33.41   3. GAD (generalized anxiety disorder)  F41.1     Past Psychiatric History: I have reviewed past psychiatric history from my progress note on 04/14/2018.  Past trials of Cymbalta, Celexa, Pristiq, Effexor, Lamictal, Adderall, Ritalin, Abilify, benzodiazepines, Prozac  Past Medical History:  Past Medical History:  Diagnosis Date  . ADHD (attention deficit hyperactivity disorder)   . Anxiety   . Depression     Past Surgical History:  Procedure Laterality Date  . ABDOMINAL SURGERY    . CHOLECYSTECTOMY    . ESOPHAGECTOMY      Family Psychiatric History: I have reviewed family psychiatric history from my progress note on 04/14/2018 Family History:  Family History  Problem Relation Age of Onset  . Anxiety disorder Mother   . Anxiety disorder Father   . ADD / ADHD Brother   . Anxiety disorder Brother   . Anxiety disorder Maternal Grandmother     Social History: Reviewed social history from my progress note on 04/14/2018 Social History   Socioeconomic History  . Marital status: Single    Spouse name: Not on file  . Number of children: Not on file  . Years of education: Not on file  . Highest education level: Not on file  Occupational History  . Not on file  Social Needs  . Financial resource strain: Not hard at all  . Food insecurity    Worry: Never true    Inability: Never true  . Transportation needs    Medical: No    Non-medical: No  Tobacco Use  . Smoking status: Never Smoker  . Smokeless tobacco: Never Used  Substance and Sexual Activity  . Alcohol use: No  . Drug use: Not Currently    Types: Marijuana    Comment: last used 6 mths ago  . Sexual activity: Yes    Partners: Male    Birth control/protection: Condom  Lifestyle  . Physical activity    Days per week: 0 days    Minutes per session: 0 min  . Stress: Not on file  Relationships  . Social Herbalist on phone: Not on file    Gets together: Not on file    Attends religious service:  Never    Active member of club or organization: No    Attends meetings of clubs or organizations: Never    Relationship status: Not on file  Other Topics Concern  . Not on file  Social History Narrative  . Not on file    Allergies:  Allergies  Allergen Reactions  . Fentanyl Hives  . Metoclopramide Other (See Comments)  . Morphine Hives  . Butalbital-Apap-Caffeine Other (See Comments)    Paranoia  . Diazepam Other (See Comments)  . Penicillins   . Promethazine     seizures  . Latex Rash  . Lorazepam Other (See Comments)    combative    Metabolic Disorder Labs: No results found for: HGBA1C, MPG No results found for: PROLACTIN No results found for: CHOL, TRIG, HDL, CHOLHDL, VLDL, LDLCALC Lab Results  Component Value Date   TSH 0.733 06/19/2010   TSH 0.875 09/27/2009    Therapeutic Level Labs: No results found for: LITHIUM No results found for: VALPROATE No components found for:  CBMZ  Current Medications: Current Outpatient Medications  Medication Sig Dispense Refill  . amphetamine-dextroamphetamine (ADDERALL) 10 MG tablet Take 1 tablet (10 mg total) by mouth daily after lunch. 30 tablet 0  . [START ON 04/29/2019] amphetamine-dextroamphetamine (ADDERALL) 10 MG tablet Take 1 tablet (10 mg total) by mouth daily. After lunch 30 tablet 0  . amphetamine-dextroamphetamine (ADDERALL) 30 MG tablet Take 1 tablet by mouth daily. 30 tablet 0  . [START ON 04/29/2019] amphetamine-dextroamphetamine (ADDERALL) 30 MG tablet Take 1 tablet by mouth daily. 30 tablet 0  . hydrOXYzine (ATARAX/VISTARIL) 25 MG tablet Take 0.5-1 tablets (12.5-25 mg total) by mouth 2 (two) times daily as needed for anxiety. 60 tablet 2  . Melatonin 3 MG TABS Take 1 tablet (3 mg total) by mouth at bedtime. 100 tablet 0  . ondansetron (ZOFRAN-ODT) 4 MG disintegrating tablet Take 8 mg by mouth.    . oseltamivir (TAMIFLU) 75 MG capsule TAKE 1 CAPSULE BY MOUTH FOR 10 DAYS    . pantoprazole (PROTONIX) 20 MG tablet  Take 40 mg by mouth.    . pantoprazole (PROTONIX) 40 MG tablet   0  . sucralfate (CARAFATE) 1 g tablet Take 1 g by mouth 4 (four) times daily.  0   No current facility-administered medications for this visit.      Musculoskeletal: Strength & Muscle Tone: UTA Gait & Station: normal Patient leans: [name]/A  Psychiatric Specialty Exam: Review of Systems  Psychiatric/Behavioral: Negative for hallucinations, substance abuse and suicidal ideas. The patient is not nervous/anxious.   All other systems reviewed and are negative.   There were no vitals taken for this visit.There is no height or weight on file to calculate BMI.  General Appearance: Casual  Eye Contact:  Fair  Speech:  Clear and Coherent  Volume:  Normal  Mood:  Euthymic  Affect:  Congruent  Thought Process:  Goal Directed and Descriptions of Associations: Intact  Orientation:  Full (Time, Place, and Person)  Thought Content: Logical   Suicidal Thoughts:  No  Homicidal Thoughts:  No  Memory:  Immediate;   Fair Recent;   Fair Remote;   Fair  Judgement:  Fair  Insight:  Fair  Psychomotor Activity:  Normal  Concentration:  Concentration: Fair and Attention Span: Fair  Recall:  [employer] of Knowledge: Fair  Language: Fair  Akathisia:  No  Handed:  Right  AIMS (if indicated): denies tremors, rigidity  Assets:  Communication Skills Desire for Improvement Social Support  ADL's:  Intact  Cognition: WNL  Sleep:  Fair   Screenings:   Assessment and Plan: Burnett Harry is a 38 year old Caucasian female who has a history of depression, anxiety, ADD, esophageal achalasia was evaluated by telemedicine today.  Patient is currently doing well on the current medication regimen.  Plan ADD-stable Adderall 30 mg p.o. daily Adderall 10 mg p.o. daily I have reviewed [place] controlled substance database. She has one prescription pending at the pharmacy.  She will pick it up and let writer know when she is due for further refills.  For  history of MDD/GAD-she is currently not on medications.  We will continue to monitor closely.  Follow-up in clinic in 3 months or sooner if needed.  December 9 at 2 PM  I have spent atleast 10 minutes non face to face with patient today. More than 50 % of the time was spent for psychoeducation and supportive psychotherapy and care coordination. This note was generated in part or whole with voice recognition software. Voice recognition is usually quite accurate but there are transcription errors that can and very often do occur. I apologize for any typographical errors that were not detected and corrected.       Jomarie Longs, MD 04/25/2019, 5:01 PM

## 2019-05-16 DIAGNOSIS — A6000 Herpesviral infection of urogenital system, unspecified: Secondary | ICD-10-CM | POA: Insufficient documentation

## 2019-05-25 ENCOUNTER — Other Ambulatory Visit: Payer: Self-pay

## 2019-05-25 ENCOUNTER — Ambulatory Visit (INDEPENDENT_AMBULATORY_CARE_PROVIDER_SITE_OTHER): Payer: Medicare Other | Admitting: Psychiatry

## 2019-05-25 ENCOUNTER — Encounter: Payer: Self-pay | Admitting: Psychiatry

## 2019-05-25 DIAGNOSIS — F3341 Major depressive disorder, recurrent, in partial remission: Secondary | ICD-10-CM

## 2019-05-25 DIAGNOSIS — F411 Generalized anxiety disorder: Secondary | ICD-10-CM

## 2019-05-25 DIAGNOSIS — F900 Attention-deficit hyperactivity disorder, predominantly inattentive type: Secondary | ICD-10-CM

## 2019-05-25 MED ORDER — AMPHETAMINE-DEXTROAMPHETAMINE 30 MG PO TABS: 30.0000 mg | ORAL_TABLET | Freq: Every day | ORAL | 0 refills | Status: DC

## 2019-05-25 MED ORDER — AMPHETAMINE-DEXTROAMPHETAMINE 10 MG PO TABS: 10.0000 mg | ORAL_TABLET | Freq: Every day | ORAL | 0 refills | Status: DC

## 2019-05-25 NOTE — Progress Notes (Signed)
Virtual Visit via Telephone Note  I connected with [name] [name] on 05/25/19 at  2:00 PM EDT by telephone and verified that I am speaking with the correct person using two identifiers.   I discussed the limitations, risks, security and privacy concerns of performing an evaluation and management service by telephone and the availability of in person appointments. I also discussed with the patient that there may be a patient responsible charge related to this service. The patient expressed understanding and agreed to proceed.   I discussed the assessment and treatment plan with the patient. The patient was provided an opportunity to ask questions and all were answered. The patient agreed with the plan and demonstrated an understanding of the instructions.   The patient was advised to call back or seek an in-person evaluation if the symptoms worsen or if the condition fails to improve as anticipated.   BH MD OP Progress Note  05/25/2019 5:26 PM [name] OrisShelli N Thome  MRN:  [id number]  Chief Complaint:  Chief Complaint    Follow-up     HPI: [name] [name] is a 38 year old Caucasian female, divorced, lives in [place], [place], was evaluated by telemedicine today.  Patient has a history of ADHD, MDD in remission, GI problems like achalasia.  Patient preferred to do a phone call today.  She today reports she is currently doing well on the current medication regimen.  She reports she continues to be compliant with her Adderall.  She denies any problems with her attention or focus.  She is able to get the chores around the house done.  She is also able to help her children with home schooling.  She reports sleep is good.  She reports she is eating healthy and actually have lost weight because she is more active.  She however denies any appetite suppression from the Adderall.  She reports she is interested in psychotherapy sessions however has not been able to find a therapist yet.  Provided her information  for therapist in the community.  Patient denies any suicidality, homicidality or perceptual disturbances.  Patient denies any substance abuse problems.  Patient denies any other concerns today.   Visit Diagnosis:    ICD-10-CM   1. Attention deficit hyperactivity disorder (ADHD), predominantly inattentive type  F90.0 amphetamine-dextroamphetamine (ADDERALL) 10 MG tablet    amphetamine-dextroamphetamine (ADDERALL) 30 MG tablet    amphetamine-dextroamphetamine (ADDERALL) 10 MG tablet    amphetamine-dextroamphetamine (ADDERALL) 30 MG tablet  2. MDD (major depressive disorder), recurrent, in partial remission (HCC)  F33.41   3. GAD (generalized anxiety disorder)  F41.1     Past Psychiatric History: I have reviewed past psychiatric history from my progress note on 04/14/2018.  Past trials of Cymbalta, Celexa, Pristiq, Effexor, Lamictal, Adderall, Ritalin, Abilify, benzodiazepines, Prozac  Past Medical History:  Past Medical History:  Diagnosis Date  . ADHD (attention deficit hyperactivity disorder)   . Anxiety   . Depression     Past Surgical History:  Procedure Laterality Date  . ABDOMINAL SURGERY    . CHOLECYSTECTOMY    . ESOPHAGECTOMY      Family Psychiatric History: I have reviewed family psychiatric history from my progress note on 04/14/2018  Family History:  Family History  Problem Relation Age of Onset  . Anxiety disorder Mother   . Anxiety disorder Father   . ADD / ADHD Brother   . Anxiety disorder Brother   . Anxiety disorder Maternal Grandmother     Social History: Reviewed social history from my progress  note on 04/14/2018. Social History   Socioeconomic History  . Marital status: Single    Spouse name: Not on file  . Number of children: Not on file  . Years of education: Not on file  . Highest education level: Not on file  Occupational History  . Not on file  Social Needs  . Financial resource strain: Not hard at all  . Food insecurity    Worry: Never  true    Inability: Never true  . Transportation needs    Medical: No    Non-medical: No  Tobacco Use  . Smoking status: Never Smoker  . Smokeless tobacco: Never Used  Substance and Sexual Activity  . Alcohol use: No  . Drug use: Not Currently    Types: Marijuana    Comment: last used 6 mths ago  . Sexual activity: Yes    Partners: Male    Birth control/protection: Condom  Lifestyle  . Physical activity    Days per week: 0 days    Minutes per session: 0 min  . Stress: Not on file  Relationships  . Social Musician on phone: Not on file    Gets together: Not on file    Attends religious service: Never    Active member of club or organization: No    Attends meetings of clubs or organizations: Never    Relationship status: Not on file  Other Topics Concern  . Not on file  Social History Narrative  . Not on file    Allergies:  Allergies  Allergen Reactions  . Fentanyl Hives  . Metoclopramide Other (See Comments)  . Morphine Hives  . Butalbital-Apap-Caffeine Other (See Comments)    Paranoia  . Diazepam Other (See Comments)  . Penicillins   . Promethazine     seizures  . Latex Rash  . Lorazepam Other (See Comments)    combative    Metabolic Disorder Labs: No results found for: HGBA1C, MPG No results found for: PROLACTIN No results found for: CHOL, TRIG, HDL, CHOLHDL, VLDL, LDLCALC Lab Results  Component Value Date   TSH 0.733 06/19/2010   TSH 0.875 09/27/2009    Therapeutic Level Labs: No results found for: LITHIUM No results found for: VALPROATE No components found for:  CBMZ  Current Medications: Current Outpatient Medications  Medication Sig Dispense Refill  . [START ON 06/05/2019] amphetamine-dextroamphetamine (ADDERALL) 10 MG tablet Take 1 tablet (10 mg total) by mouth daily after lunch. 30 tablet 0  . [START ON 07/04/2019] amphetamine-dextroamphetamine (ADDERALL) 10 MG tablet Take 1 tablet (10 mg total) by mouth daily. After lunch 30  tablet 0  . [START ON 06/05/2019] amphetamine-dextroamphetamine (ADDERALL) 30 MG tablet Take 1 tablet by mouth daily. 30 tablet 0  . [START ON 07/04/2019] amphetamine-dextroamphetamine (ADDERALL) 30 MG tablet Take 1 tablet by mouth daily. 30 tablet 0  . clindamycin (CLEOCIN) 300 MG capsule TAKE 1 CAPSULE 3 TIMES A DAY    . hydrOXYzine (ATARAX/VISTARIL) 25 MG tablet Take 0.5-1 tablets (12.5-25 mg total) by mouth 2 (two) times daily as needed for anxiety. 60 tablet 2  . Melatonin 3 MG TABS Take 1 tablet (3 mg total) by mouth at bedtime. 100 tablet 0  . ondansetron (ZOFRAN-ODT) 4 MG disintegrating tablet Take 8 mg by mouth.    . oseltamivir (TAMIFLU) 75 MG capsule TAKE 1 CAPSULE BY MOUTH FOR 10 DAYS    . pantoprazole (PROTONIX) 20 MG tablet Take 40 mg by mouth.    . pantoprazole (  PROTONIX) 40 MG tablet   0  . sucralfate (CARAFATE) 1 g tablet Take 1 g by mouth 4 (four) times daily.  0   No current facility-administered medications for this visit.      Musculoskeletal: Strength & Muscle Tone: UTA Gait & Station: Reports as WNL Patient leans: N/A  Psychiatric Specialty Exam: Review of Systems  Psychiatric/Behavioral: Negative for depression, hallucinations, substance abuse and suicidal ideas. The patient is not nervous/anxious.   All other systems reviewed and are negative.   There were no vitals taken for this visit.There is no height or weight on file to calculate BMI.  General Appearance: UTA  Eye Contact:  UTA  Speech:  Clear and Coherent  Volume:  Normal  Mood:  Euthymic  Affect:  UTA  Thought Process:  Goal Directed and Descriptions of Associations: Intact  Orientation:  Full (Time, Place, and Person)  Thought Content: Logical   Suicidal Thoughts:  No  Homicidal Thoughts:  No  Memory:  Immediate;   Fair Recent;   Fair Remote;   Fair  Judgement:  Fair  Insight:  Fair  Psychomotor Activity:  UTA  Concentration:  Concentration: Fair and Attention Span: Fair  Recall:  [employer] of Knowledge: Fair  Language: Fair  Akathisia:  No  Handed:  Right  AIMS (if indicated): Denies tremors, rigidity  Assets:  Communication Skills Desire for Improvement Housing Intimacy Social Support Talents/Skills Transportation Vocational/Educational  ADL's:  Intact  Cognition: WNL  Sleep:  Fair   Screenings:   Assessment and Plan: [name] [name] is a 38 year old Caucasian female who has a history of depression, anxiety, ADD, esophageal achalasia was evaluated by telemedicine today.  Patient is currently doing well on current medication regimen.  Continue plan as noted below.  Plan ADD-stable Adderall 30 mg p.o. daily Adderall 10 mg p.o. daily Provided 2 prescriptions with date specified last 1 to be filled on or after 07/04/2019. I have reviewed [place] controlled substance database.  History of MDD/GAD-we will continue to monitor closely.  She is currently not on medications.  Provided information for therapist Dwyane Dee in [place].  Advised her to contact to establish care.  Also advised her to call her health insurance plan for a list of therapist.   Follow-up in clinic in 2 months or sooner if needed.  December 21 at 2 PM  I have spent atleast 15 minutes non  face to face with patient today. More than 50 % of the time was spent for psychoeducation and supportive psychotherapy and care coordination. This note was generated in part or whole with voice recognition software. Voice recognition is usually quite accurate but there are transcription errors that can and very often do occur. I apologize for any typographical errors that were not detected and corrected.       Ursula Alert, MD 05/25/2019, 5:26 PM

## 2019-06-27 ENCOUNTER — Other Ambulatory Visit (HOSPITAL_COMMUNITY)
Admission: RE | Admit: 2019-06-27 | Discharge: 2019-06-27 | Disposition: A | Discharge status: Home / Self Care | Payer: Medicare Other | Source: Ambulatory Visit | Attending: Obstetrics & Gynecology | Admitting: Obstetrics & Gynecology

## 2019-06-27 ENCOUNTER — Other Ambulatory Visit: Payer: Self-pay

## 2019-06-27 ENCOUNTER — Ambulatory Visit (INDEPENDENT_AMBULATORY_CARE_PROVIDER_SITE_OTHER): Payer: Medicare Other | Admitting: Obstetrics & Gynecology

## 2019-06-27 ENCOUNTER — Encounter: Payer: Self-pay | Admitting: Obstetrics & Gynecology

## 2019-06-27 VITALS — BP 100/60 | Ht 64.0 in | Wt 155.0 lb

## 2019-06-27 DIAGNOSIS — R87610 Atypical squamous cells of undetermined significance on cytologic smear of cervix (ASC-US): Secondary | ICD-10-CM | POA: Insufficient documentation

## 2019-06-27 DIAGNOSIS — Z124 Encounter for screening for malignant neoplasm of cervix: Secondary | ICD-10-CM | POA: Diagnosis present

## 2019-06-27 DIAGNOSIS — N946 Dysmenorrhea, unspecified: Secondary | ICD-10-CM

## 2019-06-27 DIAGNOSIS — Z01419 Encounter for gynecological examination (general) (routine) without abnormal findings: Secondary | ICD-10-CM

## 2019-06-27 DIAGNOSIS — Z1231 Encounter for screening mammogram for malignant neoplasm of breast: Secondary | ICD-10-CM

## 2019-06-27 DIAGNOSIS — Z1151 Encounter for screening for human papillomavirus (HPV): Secondary | ICD-10-CM | POA: Diagnosis not present

## 2019-06-27 DIAGNOSIS — N920 Excessive and frequent menstruation with regular cycle: Secondary | ICD-10-CM

## 2019-06-27 MED ORDER — VALACYCLOVIR HCL 500 MG PO TABS: 500.0000 mg | ORAL_TABLET | Freq: Every day | ORAL | 3 refills | Status: DC

## 2019-06-27 NOTE — Progress Notes (Signed)
HPI:      [name] [name] is a 38 y.o. [id number] who LMP was Patient's last menstrual period was 05/30/2019., she presents today for her annual examination. The patient has no complaints today. The patient is not currently sexually active. Her last pap: approximate date 2019 and was normal and last mammogram: patient has never had a mammogram. The patient does perform self breast exams.  There is no notable family history of breast or ovarian cancer in her family.  The patient has regular exercise: yes.  The patient denies current symptoms of depression.  Periods are monthly, 5-7 days and heavy w associated pain, headaches, and mood irritability. NSVD x2, no desire for future pregnancy  GYN History: Contraception: abstinence  Prior HSV one episode, takes preventative Valtrex  PMHx: Past Medical History:  Diagnosis Date  . ADHD (attention deficit hyperactivity disorder)   . Anxiety   . Depression    Past Surgical History:  Procedure Laterality Date  . ABDOMINAL SURGERY    . CHOLECYSTECTOMY    . ESOPHAGECTOMY     Family History  Problem Relation Age of Onset  . Anxiety disorder Mother   . Anxiety disorder Father   . ADD / ADHD Brother   . Anxiety disorder Brother   . Anxiety disorder Maternal Grandmother    Social History   Tobacco Use  . Smoking status: Never Smoker  . Smokeless tobacco: Never Used  Substance Use Topics  . Alcohol use: No  . Drug use: Not Currently    Types: Marijuana    Comment: last used 6 mths ago    Current Outpatient Medications:  .  amphetamine-dextroamphetamine (ADDERALL) 10 MG tablet, Take 1 tablet (10 mg total) by mouth daily after lunch., Disp: 30 tablet, Rfl: 0 .  amphetamine-dextroamphetamine (ADDERALL) 30 MG tablet, Take 1 tablet by mouth daily., Disp: 30 tablet, Rfl: 0 .  [START ON 07/04/2019] amphetamine-dextroamphetamine (ADDERALL) 10 MG tablet, Take 1 tablet (10 mg total) by mouth daily. After lunch, Disp: 30 tablet, Rfl: 0 .  [START  ON 07/04/2019] amphetamine-dextroamphetamine (ADDERALL) 30 MG tablet, Take 1 tablet by mouth daily., Disp: 30 tablet, Rfl: 0 .  valACYclovir (VALTREX) 500 MG tablet, Take 1 tablet (500 mg total) by mouth daily., Disp: 90 tablet, Rfl: 3 Allergies: Fentanyl, Metoclopramide, Morphine, Butalbital-apap-caffeine, Diazepam, Penicillins, Promethazine, Latex, and Lorazepam  Review of Systems  Constitutional: Negative for chills, fever and malaise/fatigue.  HENT: Negative for congestion, sinus pain and sore throat.   Eyes: Negative for blurred vision and pain.  Respiratory: Negative for cough and wheezing.   Cardiovascular: Negative for chest pain and leg swelling.  Gastrointestinal: Negative for abdominal pain, constipation, diarrhea, heartburn, nausea and vomiting.  Genitourinary: Negative for dysuria, frequency, hematuria and urgency.  Musculoskeletal: Negative for back pain, joint pain, myalgias and neck pain.  Skin: Negative for itching and rash.  Neurological: Negative for dizziness, tremors and weakness.  Endo/Heme/Allergies: Does not bruise/bleed easily.  Psychiatric/Behavioral: Negative for depression. The patient is not nervous/anxious and does not have insomnia.     Objective: BP 100/60   Ht 5\' 4"  (1.626 m)   Wt 155 lb (70.3 kg)   LMP 05/30/2019   BMI 26.61 kg/m   Filed Weights   06/27/19 1525  Weight: 155 lb (70.3 kg)   Body mass index is 26.61 kg/m. Physical Exam Constitutional:      General: She is not in acute distress.    Appearance: She is well-developed.  Genitourinary:  Pelvic exam was performed with patient supine.     Vagina, uterus and rectum normal.     No lesions in the vagina.     No vaginal bleeding.     No cervical motion tenderness, friability, lesion or polyp.     Uterus is mobile.     Uterus is not enlarged.     No uterine mass detected.    Uterus is midaxial.     No right or left adnexal mass present.     Right adnexa not tender.     Left adnexa  not tender.  HENT:     Head: Normocephalic and atraumatic. No laceration.     Right Ear: Hearing normal.     Left Ear: Hearing normal.     Mouth/Throat:     Pharynx: Uvula midline.  Eyes:     Pupils: Pupils are equal, round, and reactive to light.  Neck:     Musculoskeletal: Normal range of motion and neck supple.     Thyroid: No thyromegaly.  Cardiovascular:     Rate and Rhythm: Normal rate and regular rhythm.     Heart sounds: No murmur. No friction rub. No gallop.   Pulmonary:     Effort: Pulmonary effort is normal. No respiratory distress.     Breath sounds: Normal breath sounds. No wheezing.  Chest:     Breasts:        Right: No mass, skin change or tenderness.        Left: No mass, skin change or tenderness.  Abdominal:     General: Bowel sounds are normal. There is no distension.     Palpations: Abdomen is soft.     Tenderness: There is no abdominal tenderness. There is no rebound.  Musculoskeletal: Normal range of motion.  Neurological:     Mental Status: She is alert and oriented to person, place, and time.     Cranial Nerves: No cranial nerve deficit.  Skin:    General: Skin is warm and dry.  Psychiatric:        Judgment: Judgment normal.  Vitals signs reviewed.     Assessment:  ANNUAL EXAM 1. Women's annual routine gynecological examination   2. Encounter for screening mammogram for malignant neoplasm of breast   3. Menorrhagia with regular cycle   4. Dysmenorrhea      Screening Plan:            1.  Cervical Screening-  Pap smear done today  2. Breast screening- Exam annually and mammogram>40 planned   3. Colonoscopy every 10 years, Hemoccult testing - after age 25  4. Labs managed by PCP  5. Counseling for contraception: no method,   6. Menorrhagia and Dysmenorrhea: pt also counseled as to ablation and hysterectomy optoions for menorrhagia and dysmenorrhea [place] first Info provided Considering ablation  7. 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  Barnett Applebaum, MD, [facility], [facility] 06/27/2019  4:15 PM

## 2019-06-27 NOTE — Patient Instructions (Signed)
PAP every three years Mammogram every year    Call [phone] to schedule at [facility]    Endometrial Ablation Endometrial ablation is a procedure that destroys the thin inner layer of the lining of the uterus (endometrium). This procedure may be done:  To stop heavy periods.  To stop bleeding that is causing anemia.  To control irregular bleeding.  To treat bleeding caused by small tumors (fibroids) in the endometrium. This procedure is often an alternative to major surgery, such as removal of the uterus and cervix (hysterectomy). As a result of this procedure:  You may not be able to have children. However, if you are premenopausal (you have not gone through menopause): ? You may still have a small chance of getting pregnant. ? You will need to use a reliable method of birth control after the procedure to prevent pregnancy.  You may stop having a menstrual period, or you may have only a small amount of bleeding during your period. Menstruation may return several years after the procedure. Tell a health care provider about:  Any allergies you have.  All medicines you are taking, including vitamins, herbs, eye drops, creams, and over-the-counter medicines.  Any problems you or family members have had with the use of anesthetic medicines.  Any blood disorders you have.  Any surgeries you have had.  Any medical conditions you have. What are the risks? Generally, this is a safe procedure. However, problems may occur, including:  A hole (perforation) in the uterus or bowel.  Infection of the uterus, bladder, or vagina.  Bleeding.  Damage to other structures or organs.  An air bubble in the lung (air embolus).  Problems with pregnancy after the procedure.  Failure of the procedure.  Decreased ability to diagnose cancer in the endometrium. What happens before the procedure?  You will have tests of your endometrium to make sure there are no pre-cancerous cells or  cancer cells present.  You may have an ultrasound of the uterus.  You may be given medicines to thin the endometrium.  Ask your health care provider about: ? Changing or stopping your regular medicines. This is especially important if you take diabetes medicines or blood thinners. ? Taking medicines such as aspirin and ibuprofen. These medicines can thin your blood. Do not take these medicines before your procedure if your doctor tells you not to.  Plan to have someone take you home from the hospital or clinic. What happens during the procedure?   You will lie on an exam table with your feet and legs supported as in a pelvic exam.  To lower your risk of infection: ? Your health care team will wash or sanitize their hands and put on germ-free (sterile) gloves. ? Your genital area will be washed with soap.  An IV tube will be inserted into one of your veins.  You will be given a medicine to help you relax (sedative).  A surgical instrument with a light and camera (resectoscope) will be inserted into your vagina and moved into your uterus. This allows your surgeon to see inside your uterus.  Endometrial tissue will be removed using one of the following methods: ? Radiofrequency. This method uses a radiofrequency-alternating electric current to remove the endometrium. ? Cryotherapy. This method uses extreme cold to freeze the endometrium. ? Heated-free liquid. This method uses a heated saltwater (saline) solution to remove the endometrium. ? Microwave. This method uses high-energy microwaves to heat up the endometrium and remove it. ? Thermal balloon.  This method involves inserting a catheter with a balloon tip into the uterus. The balloon tip is filled with heated fluid to remove the endometrium. The procedure may vary among health care providers and hospitals. What happens after the procedure?  Your blood pressure, heart rate, breathing rate, and blood oxygen level will be monitored  until the medicines you were given have worn off.  As tissue healing occurs, you may notice vaginal bleeding for 4-6 weeks after the procedure. You may also experience: ? Cramps. ? Thin, watery vaginal discharge that is light pink or brown in color. ? A need to urinate more frequently than usual. ? Nausea.  Do not drive for 24 hours if you were given a sedative.  Do not have sex or insert anything into your vagina until your health care provider approves. Summary  Endometrial ablation is done to treat the many causes of heavy menstrual bleeding.  The procedure may be done only after medications have been tried to control the bleeding.  Plan to have someone take you home from the hospital or clinic. This information is not intended to replace advice given to you by your health care provider. Make sure you discuss any questions you have with your health care provider. Document Released: 06/06/2004 Document Revised: 01/12/2018 Document Reviewed: 08/14/2016 Elsevier Patient Education  2020 [employer].

## 2019-06-30 ENCOUNTER — Telehealth: Payer: Self-pay | Admitting: Obstetrics & Gynecology

## 2019-06-30 LAB — CYTOLOGY - PAP
Comment: NEGATIVE
Diagnosis: UNDETERMINED — AB
High risk HPV: POSITIVE — AB

## 2019-06-30 NOTE — Telephone Encounter (Signed)
Patient is aware appointment status has changed to ultrasound and then Colpo. Patient is requesting medication to help with her nerves. Please advise

## 2019-06-30 NOTE — Telephone Encounter (Signed)
None required for these procedures

## 2019-06-30 NOTE — Progress Notes (Signed)
Sch Colpo, pt aware. May coordinate w 12/1 appt if possible

## 2019-06-30 NOTE — Telephone Encounter (Signed)
-----   Message from Gae Dry, MD sent at 06/30/2019  1:35 PM EST ----- Sch Colpo, pt aware. May coordinate w 12/1 appt if possible

## 2019-07-01 NOTE — Telephone Encounter (Signed)
Pt aware.

## 2019-07-12 ENCOUNTER — Other Ambulatory Visit: Payer: Self-pay

## 2019-07-12 ENCOUNTER — Encounter: Payer: Self-pay | Admitting: Obstetrics & Gynecology

## 2019-07-12 ENCOUNTER — Ambulatory Visit: Payer: Medicare Other | Admitting: Obstetrics & Gynecology

## 2019-07-12 ENCOUNTER — Ambulatory Visit (INDEPENDENT_AMBULATORY_CARE_PROVIDER_SITE_OTHER): Payer: Medicare Other | Admitting: Obstetrics & Gynecology

## 2019-07-12 ENCOUNTER — Other Ambulatory Visit (HOSPITAL_COMMUNITY)
Admission: RE | Admit: 2019-07-12 | Discharge: 2019-07-12 | Disposition: A | Discharge status: Home / Self Care | Payer: Medicare Other | Source: Ambulatory Visit | Attending: Obstetrics & Gynecology | Admitting: Obstetrics & Gynecology

## 2019-07-12 ENCOUNTER — Other Ambulatory Visit: Payer: Medicare Other

## 2019-07-12 VITALS — BP 112/78 | HR 99 | Ht 64.0 in | Wt 158.0 lb

## 2019-07-12 DIAGNOSIS — N870 Mild cervical dysplasia: Secondary | ICD-10-CM | POA: Diagnosis not present

## 2019-07-12 DIAGNOSIS — R87810 Cervical high risk human papillomavirus (HPV) DNA test positive: Secondary | ICD-10-CM | POA: Insufficient documentation

## 2019-07-12 DIAGNOSIS — R87610 Atypical squamous cells of undetermined significance on cytologic smear of cervix (ASC-US): Secondary | ICD-10-CM | POA: Insufficient documentation

## 2019-07-12 NOTE — Progress Notes (Signed)
HPI:  [name] [name] is a 38 y.o.  [phone]  who presents today for evaluation and management of abnormal cervical cytology.    Dysplasia History:  ASCUS w HPV POS Prior PAP normal Last Abnormal PAP was 12 years ago, s/p LEEP then  ROS:  Pertinent items are noted in HPI.  OB History  Gravida Para Term Preterm AB Living  4 2 2   2 2   SAB TAB Ectopic Multiple Live Births               # Outcome Date GA Lbr Len/2nd Weight Sex Delivery Anes PTL Lv  4 AB           3 AB           2 Term      Vag-Spont     1 Term      Vag-Spont       Past Medical History:  Diagnosis Date  . ADHD (attention deficit hyperactivity disorder)   . Anxiety   . Depression     Past Surgical History:  Procedure Laterality Date  . ABDOMINAL SURGERY    . CHOLECYSTECTOMY    . ESOPHAGECTOMY      SOCIAL HISTORY: Social History   Substance and Sexual Activity  Alcohol Use No   Social History   Substance and Sexual Activity  Drug Use Not Currently  . Types: Marijuana   Comment: last used 6 mths ago     Family History  Problem Relation Age of Onset  . Anxiety disorder Mother   . Anxiety disorder Father   . ADD / ADHD Brother   . Anxiety disorder Brother   . Anxiety disorder Maternal Grandmother     ALLERGIES:  Fentanyl, Metoclopramide, Morphine, Butalbital-apap-caffeine, Diazepam, Penicillins, Promethazine, Latex, and Lorazepam  Current Outpatient Medications on File Prior to Visit  Medication Sig Dispense Refill  . amphetamine-dextroamphetamine (ADDERALL) 10 MG tablet Take 1 tablet (10 mg total) by mouth daily. After lunch 30 tablet 0  . amphetamine-dextroamphetamine (ADDERALL) 30 MG tablet Take 1 tablet by mouth daily. 30 tablet 0  . valACYclovir (VALTREX) 500 MG tablet Take 1 tablet (500 mg total) by mouth daily. 90 tablet 3  . amphetamine-dextroamphetamine (ADDERALL) 10 MG tablet Take 1 tablet (10 mg total) by mouth daily after lunch. 30 tablet 0  . amphetamine-dextroamphetamine  (ADDERALL) 30 MG tablet Take 1 tablet by mouth daily. 30 tablet 0   No current facility-administered medications on file prior to visit.     Physical Exam: -Vitals:  BP 112/78 (BP Location: Right Arm, Patient Position: Sitting, Cuff Size: Normal)   Pulse 99   Ht 5\' 4"  (1.626 m)   Wt 158 lb (71.7 kg)   LMP 06/30/2019   BMI 27.12 kg/m  GEN: WD, WN, NAD.  A+ O x 3, good mood and affect. ABD:  NT, ND.  Soft, no masses.  No hernias noted.   Pelvic:   Vulva: Normal appearance.  No lesions.  Vagina: No lesions or abnormalities noted.  Support: Normal pelvic support.  Urethra No masses tenderness or scarring.  Meatus Normal size without lesions or prolapse.  Cervix: See below.  Anus: Normal exam.  No lesions.  Perineum: Normal exam.  No lesions.        Bimanual   Uterus: Normal size.  Non-tender.  Mobile.  AV.  Adnexae: No masses.  Non-tender to palpation.  Cul-de-sac: Negative for abnormality.   PROCEDURE: 1.  Urine Pregnancy Test:  not done 2.  Colposcopy performed with 4% acetic acid after verbal consent obtained                                         -Aceto-white Lesions Location(s): 12 o'clock.              -Biopsy performed at 12, 6 o'clock               -ECC indicated and performed: Yes.       -Biopsy sites made hemostatic with pressure, AgNO3, and/or Monsel's solution   -Satisfactory colposcopy: Yes.      -Evidence of Invasive cervical CA :  NO  ASSESSMENT:  [name] [name] is a 38 y.o. [id number] here for  1. ASCUS with positive high risk HPV cervical   .  PLAN: 1.  I discussed the grading system of pap smears and HPV high risk viral types.  We will discuss and base management after colpo results return. 2. Follow up PAP 6 months, vs intervention if high grade dysplasia identified 3. Treatment of persistantly abnormal PAP smears and cervical dysplasia, even mild, is discussed w pt today in detail, as well as the pros and cons of Cryo and LEEP procedures. Will consider  and discuss after results.      Barnett Applebaum, MD, [facility], [facility] 07/12/2019  4:02 PM

## 2019-07-12 NOTE — Patient Instructions (Signed)

## 2019-07-14 LAB — SURGICAL PATHOLOGY

## 2019-07-20 ENCOUNTER — Other Ambulatory Visit: Payer: Self-pay

## 2019-07-20 ENCOUNTER — Encounter: Payer: Self-pay | Admitting: Psychiatry

## 2019-07-20 ENCOUNTER — Ambulatory Visit (INDEPENDENT_AMBULATORY_CARE_PROVIDER_SITE_OTHER): Payer: Medicare Other | Admitting: Psychiatry

## 2019-07-20 DIAGNOSIS — F411 Generalized anxiety disorder: Secondary | ICD-10-CM | POA: Diagnosis not present

## 2019-07-20 DIAGNOSIS — F3342 Major depressive disorder, recurrent, in full remission: Secondary | ICD-10-CM

## 2019-07-20 DIAGNOSIS — F900 Attention-deficit hyperactivity disorder, predominantly inattentive type: Secondary | ICD-10-CM | POA: Diagnosis not present

## 2019-07-20 MED ORDER — AMPHETAMINE-DEXTROAMPHETAMINE 30 MG PO TABS: 30.0000 mg | ORAL_TABLET | Freq: Every day | ORAL | 0 refills | Status: DC

## 2019-07-20 MED ORDER — AMPHETAMINE-DEXTROAMPHETAMINE 10 MG PO TABS: 10.0000 mg | ORAL_TABLET | Freq: Every day | ORAL | 0 refills | Status: DC

## 2019-07-20 NOTE — Progress Notes (Signed)
Virtual Visit via Video Note  I connected with [name] [name] on 07/20/19 at  2:00 PM EST by a video enabled telemedicine application and verified that I am speaking with the correct person using two identifiers.   I discussed the limitations of evaluation and management by telemedicine and the availability of in person appointments. The patient expressed understanding and agreed to proceed.     I discussed the assessment and treatment plan with the patient. The patient was provided an opportunity to ask questions and all were answered. The patient agreed with the plan and demonstrated an understanding of the instructions.   The patient was advised to call back or seek an in-person evaluation if the symptoms worsen or if the condition fails to improve as anticipated.   [place] MD/PA/NP OP Progress Note  07/20/2019 3:36 PM [name] [name]  MRN:  [id number]  Chief Complaint:  Chief Complaint    Follow-up     HPI: [name] [name] is a 38 year old Caucasian female, divorced, lives in [place], on [place], has a history of ADHD, MDD in remission, GI problems like achalasia was evaluated by telemedicine today.  Patient today reports she is currently doing well with regards to her mood symptoms.  She denies any significant depression or anxiety symptoms.  She denies sleep problems.  She reports appetite is good.  She reports her focus and attention is good on the Adderall.  She has been able to do the chores around the house.  She has been able to help her children with home schooling.  Her mother is supportive.  She reports she recently had a visit with her OB/GYN and at that visit her blood pressure was within normal limits.  I have reviewed medical records in [facility] by Dr. Kenton Kingfisher dated 06/27/2019, most recent 07/12/2019-BP-112/78.HR -99."  Patient continues to be compliant on the Adderall and denies side effects.  She denies any substance abuse problems.  Patient denies any suicidality, homicidality or  perceptual disturbances.  She reports she is eating healthy and has lost up to 40 pounds since coming off of the Abilify.  She is happy about the same.  She reports she was unable to contact her therapist for psychotherapy sessions-contact information was provided last visit.  She however reports she is trying to get a family therapist for the whole family and is currently speaking with her health insurance to get a list.  Visit Diagnosis:    ICD-10-CM   1. Attention deficit hyperactivity disorder (ADHD), predominantly inattentive type  F90.0 amphetamine-dextroamphetamine (ADDERALL) 10 MG tablet    amphetamine-dextroamphetamine (ADDERALL) 30 MG tablet    amphetamine-dextroamphetamine (ADDERALL) 30 MG tablet    amphetamine-dextroamphetamine (ADDERALL) 10 MG tablet    amphetamine-dextroamphetamine (ADDERALL) 30 MG tablet    amphetamine-dextroamphetamine (ADDERALL) 10 MG tablet  2. MDD (major depressive disorder), recurrent, in full remission ([place])  F33.42   3. GAD (generalized anxiety disorder)  F41.1     Past Psychiatric History: I have reviewed past psychiatric history from my progress note on 04/14/2018.  Past trials of Cymbalta, Celexa, Pristiq, Effexor, Lamictal, Adderall, Ritalin, Abilify, benzodiazepines, Prozac.  Past Medical History:  Past Medical History:  Diagnosis Date  . ADHD (attention deficit hyperactivity disorder)   . Anxiety   . Depression     Past Surgical History:  Procedure Laterality Date  . ABDOMINAL SURGERY    . CHOLECYSTECTOMY    . ESOPHAGECTOMY      Family Psychiatric History: I have reviewed family psychiatric history from my  progress note on 04/14/2018.  Family History:  Family History  Problem Relation Age of Onset  . Anxiety disorder Mother   . Anxiety disorder Father   . ADD / ADHD Brother   . Anxiety disorder Brother   . Anxiety disorder Maternal Grandmother     Social History: I have reviewed social history from my progress note on  04/14/2018. Social History   Socioeconomic History  . Marital status: Single    Spouse name: Not on file  . Number of children: Not on file  . Years of education: Not on file  . Highest education level: Not on file  Occupational History  . Not on file  Social Needs  . Financial resource strain: Not hard at all  . Food insecurity    Worry: Never true    Inability: Never true  . Transportation needs    Medical: No    Non-medical: No  Tobacco Use  . Smoking status: Never Smoker  . Smokeless tobacco: Never Used  Substance and Sexual Activity  . Alcohol use: No  . Drug use: Not Currently    Types: Marijuana    Comment: last used 6 mths ago  . Sexual activity: Yes    Partners: Male    Birth control/protection: Condom  Lifestyle  . Physical activity    Days per week: 0 days    Minutes per session: 0 min  . Stress: Not on file  Relationships  . Social Musician on phone: Not on file    Gets together: Not on file    Attends religious service: Never    Active member of club or organization: No    Attends meetings of clubs or organizations: Never    Relationship status: Not on file  Other Topics Concern  . Not on file  Social History Narrative  . Not on file    Allergies:  Allergies  Allergen Reactions  . Fentanyl Hives  . Metoclopramide Other (See Comments)  . Morphine Hives  . Butalbital-Apap-Caffeine Other (See Comments)    Paranoia  . Diazepam Other (See Comments)  . Penicillins   . Promethazine     seizures  . Latex Rash  . Lorazepam Other (See Comments)    combative    Metabolic Disorder Labs: No results found for: HGBA1C, MPG No results found for: PROLACTIN No results found for: CHOL, TRIG, HDL, CHOLHDL, VLDL, LDLCALC Lab Results  Component Value Date   TSH 0.733 06/19/2010   TSH 0.875 09/27/2009    Therapeutic Level Labs: No results found for: LITHIUM No results found for: VALPROATE No components found for:  CBMZ  Current  Medications: Current Outpatient Medications  Medication Sig Dispense Refill  . [START ON 08/12/2019] amphetamine-dextroamphetamine (ADDERALL) 10 MG tablet Take 1 tablet (10 mg total) by mouth daily after lunch. 30 tablet 0  . [START ON 09/11/2019] amphetamine-dextroamphetamine (ADDERALL) 10 MG tablet Take 1 tablet (10 mg total) by mouth daily. After lunch 30 tablet 0  . [START ON 10/10/2019] amphetamine-dextroamphetamine (ADDERALL) 10 MG tablet Take 1 tablet (10 mg total) by mouth daily after lunch. 30 tablet 0  . [START ON 08/12/2019] amphetamine-dextroamphetamine (ADDERALL) 30 MG tablet Take 1 tablet by mouth daily. 30 tablet 0  . [START ON 09/11/2019] amphetamine-dextroamphetamine (ADDERALL) 30 MG tablet Take 1 tablet by mouth daily. 30 tablet 0  . [START ON 10/10/2019] amphetamine-dextroamphetamine (ADDERALL) 30 MG tablet Take 1 tablet by mouth daily. 30 tablet 0  . fluconazole (DIFLUCAN) 150 MG  tablet PLEASE SEE ATTACHED FOR DETAILED DIRECTIONS    . valACYclovir (VALTREX) 500 MG tablet Take 1 tablet (500 mg total) by mouth daily. 90 tablet 3   No current facility-administered medications for this visit.      Musculoskeletal: Strength & Muscle Tone: UTA Gait & Station: normal Patient leans: N/A  Psychiatric Specialty Exam: Review of Systems  Psychiatric/Behavioral: Negative for depression, hallucinations, substance abuse and suicidal ideas. The patient is not nervous/anxious and does not have insomnia.   All other systems reviewed and are negative.   Last menstrual period 06/30/2019.There is no height or weight on file to calculate BMI.  General Appearance: Casual  Eye Contact:  Fair  Speech:  Clear and Coherent  Volume:  Normal  Mood:  Euthymic  Affect:  Congruent  Thought Process:  Goal Directed and Descriptions of Associations: Intact  Orientation:  Full (Time, Place, and Person)  Thought Content: Logical   Suicidal Thoughts:  No  Homicidal Thoughts:  No  Memory:  Immediate;    Fair Recent;   Fair Remote;   Fair  Judgement:  Fair  Insight:  Fair  Psychomotor Activity:  Normal  Concentration:  Concentration: Fair and Attention Span: Fair  Recall:  FiservFair  Fund of Knowledge: Fair  Language: Fair  Akathisia:  No  Handed:  Right  AIMS (if indicated):Denies tremors, rigidity  Assets:  Communication Skills Desire for Improvement Housing Social Support  ADL's:  Intact  Cognition: WNL  Sleep:  Fair   Screenings:   Assessment and Plan: [name] [name] is a 38 year old Caucasian female who has a history of depression, anxiety, ADD, esophageal achalasia was evaluated by telemedicine today.  She is currently doing well on the Adderall.  Plan as noted below.  Plan ADD-stable Adderall 30 mg p.o. daily. Adderall 10 mg p.o. daily. I have provided 3 prescriptions with date specified last 1 to be filled on or after 10/10/2019. I have reviewed [place] controlled substance database.  History of MDD/GAD-currently stable. Patient will benefit from psychotherapy sessions and was provided information for therapist in the community last visit.  She however reports she has not been able to establish care with anyone yet.  She is currently working on getting a family therapist.  Follow-up in clinic in 3 months or sooner if needed.  March 18 at 2 PM  I have spent atleast 15 minutes non  face to face with patient today. More than 50 % of the time was spent for psychoeducation and supportive psychotherapy and care coordination. This note was generated in part or whole with voice recognition software. Voice recognition is usually quite accurate but there are transcription errors that can and very often do occur. I apologize for any typographical errors that were not detected and corrected.       Jomarie LongsSaramma Hulda Reddix, MD 07/20/2019, 3:36 PM

## 2019-07-27 ENCOUNTER — Encounter: Payer: Self-pay | Admitting: Obstetrics and Gynecology

## 2019-07-27 ENCOUNTER — Ambulatory Visit: Payer: Medicare Other | Admitting: Psychiatry

## 2019-08-01 ENCOUNTER — Ambulatory Visit: Payer: Medicare Other | Admitting: Psychiatry

## 2019-08-01 ENCOUNTER — Ambulatory Visit (INDEPENDENT_AMBULATORY_CARE_PROVIDER_SITE_OTHER): Payer: Medicare Other | Admitting: Obstetrics & Gynecology

## 2019-08-01 ENCOUNTER — Encounter: Payer: Self-pay | Admitting: Obstetrics & Gynecology

## 2019-08-01 ENCOUNTER — Other Ambulatory Visit: Payer: Self-pay

## 2019-08-01 ENCOUNTER — Other Ambulatory Visit (INDEPENDENT_AMBULATORY_CARE_PROVIDER_SITE_OTHER): Payer: Medicare Other

## 2019-08-01 VITALS — BP 100/70 | Temp 97.5°F | Ht 64.0 in | Wt 155.0 lb

## 2019-08-01 DIAGNOSIS — N946 Dysmenorrhea, unspecified: Secondary | ICD-10-CM | POA: Diagnosis not present

## 2019-08-01 DIAGNOSIS — N920 Excessive and frequent menstruation with regular cycle: Secondary | ICD-10-CM | POA: Diagnosis not present

## 2019-08-01 NOTE — Progress Notes (Signed)
HPI:Pt is a 38 yo [id number] WF with painful heavy periods. Periods are monthly, 5-7 days and heavy w associated pain, headaches, and mood irritability.  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 Prior LEEP.  PMHx: She  has a past medical history of ADHD (attention deficit hyperactivity disorder), Anxiety, Depression, and Family history of ovarian cancer. Also,  has a past surgical history that includes Esophagectomy; Cholecystectomy; and Abdominal surgery., family history includes ADD / ADHD in her brother; Anxiety disorder in her brother, father, maternal grandmother, and mother; Ovarian cancer in her maternal aunt, maternal grandmother, and mother.,  reports that she has never smoked. She has never used smokeless tobacco. She reports previous drug use. Drug: Marijuana. She reports that she does not drink alcohol.  She has a current medication list which includes the following prescription(s): [START ON 08/12/2019] amphetamine-dextroamphetamine, [START ON 09/11/2019] amphetamine-dextroamphetamine, [START ON 10/10/2019] amphetamine-dextroamphetamine, [START ON 08/12/2019] amphetamine-dextroamphetamine, [START ON 09/11/2019] amphetamine-dextroamphetamine, [START ON 10/10/2019] amphetamine-dextroamphetamine, and fluconazole. Also, is allergic to fentanyl; metoclopramide; morphine; butalbital-apap-caffeine; diazepam; penicillins; promethazine; latex; and lorazepam.  Review of Systems  All other systems reviewed and are negative.   Objective: BP 100/70   Temp (!) 97.5 F (36.4 C)   Ht 5\' 4"  (1.626 m)   Wt 155 lb (70.3 kg)   BMI 26.61 kg/m   Physical examination Constitutional NAD, Conversant  Skin No rashes, lesions or ulceration.   Extremities: Moves all appropriately.  Normal ROM for age. No lymphadenopathy.  Neuro: Grossly intact  Psych: Oriented to PPT.  Normal mood. Normal affect.   US PELVIC  COMPLETE WITH TRANSVAGINAL  Result Date: 08/01/2019 Patient Name: [name] [name] DOB: [birth year] MRN: [id number] ULTRASOUND REPORT Location: [facility] Date of Service: 08/01/2019 Indications:Abnormal Uterine Bleeding Findings: The uterus is anteverted and measures 7.8 x 5.4 x 4.2 cm. Echo texture is homogenous without evidence of focal masses. The Endometrium measures 4.8 mm. Right Ovary measures 2.3 x 2.0 x 1.4 cm. It is normal in appearance. Left Ovary measures 2.4 x 1.4 x 1.3 cm. It is normal in appearance. Survey of the adnexa demonstrates no adnexal masses. There is no free fluid in the cul de sac. Impression: 1. Normal pelvic ultrasound. Recommendations: 1.Clinical correlation with the patient's History and Physical Exam. Gweneth Dimitri, RT Review of ULTRASOUND.    I have personally reviewed images and report of recent ultrasound done at [facility].    Plan of management to be discussed with patient. Barnett Applebaum, MD, [facility], [facility] 08/01/2019  3:45 PM   Assessment:    ICD-10-CM   1. Menorrhagia with regular cycle  N92.0   2. Dysmenorrhea  N94.6   Options discussed.  Wishes to consider.  Info given.  Prior surgeries and concerned over adhesion risk w hysterectomy option.  Counseled to have dx lap and if feasible cont w TLH, and if too much adhesions convert to ablation.  She prefers hyst over ablation due to additional ongoing cervical dysplasia concerns.  Discussed TVH but not a good candidate exam-wise and also would have same concerns over adhesions to fundus/ uterus to complicate the surgery and may still need dx lap look to clear for [facility].  Options of hormones and exp mgt (no intervention) also discussed.  Will think on it for now and let us know.  A total of 15 minutes were spent face-to-face with the patient during this encounter  and over half of that time dealt with counseling and coordination of care.   Annamarie Major, MD, [facility], [facility] 08/01/2019  3:45 PM

## 2019-08-11 ENCOUNTER — Other Ambulatory Visit: Payer: Self-pay | Admitting: Obstetrics & Gynecology

## 2019-08-11 ENCOUNTER — Telehealth: Payer: Self-pay

## 2019-08-11 MED ORDER — VALACYCLOVIR HCL 500 MG PO TABS: 500.0000 mg | ORAL_TABLET | Freq: Two times a day (BID) | ORAL | 1 refills | Status: DC

## 2019-08-11 NOTE — Telephone Encounter (Signed)
Pt calling; needs to explain what's going on with her valtrex rx; can't request it on MyChart.  [phone]  Pt is not able to p/u what [place] rx'd in Nov; ins won't allow it b/c the way it was written she was to take one a day for 90d but that wasn't what she was told.  She was told to take two a day for outbreaks.  She had an outbreak in Sept.  She is now out and is asking for a rx to be sent in - just enough to get her to Jan 9th when she can p/u the Nov rx.  She currently is having an outbreak b/c she is out of medicine so need two a day from now to 1/9th.

## 2019-08-11 NOTE — Telephone Encounter (Signed)
Can you send in a valtrex rx for pt?

## 2019-09-26 ENCOUNTER — Telehealth: Payer: Self-pay

## 2019-09-26 NOTE — Telephone Encounter (Signed)
-----   Message from Nadara Mustard, MD sent at 09/22/2019  7:59 AM EST ----- Regarding: MMG Received notice she has not received MMG yet as ordered at her Annual. Please check and encourage her to do this, and document conversation.

## 2019-09-26 NOTE — Telephone Encounter (Signed)
Pt aware to schedule her mammo  

## 2019-10-19 NOTE — Telephone Encounter (Signed)
Please advise 

## 2019-10-20 ENCOUNTER — Ambulatory Visit
Admission: RE | Admit: 2019-10-20 | Discharge: 2019-10-20 | Disposition: A | Discharge status: Home / Self Care | Payer: Medicare Other | Source: Ambulatory Visit | Attending: Obstetrics & Gynecology | Admitting: Obstetrics & Gynecology

## 2019-10-20 ENCOUNTER — Other Ambulatory Visit: Payer: Self-pay | Admitting: Obstetrics & Gynecology

## 2019-10-20 DIAGNOSIS — Z1231 Encounter for screening mammogram for malignant neoplasm of breast: Secondary | ICD-10-CM | POA: Diagnosis not present

## 2019-10-20 MED ORDER — VALACYCLOVIR HCL 1 G PO TABS: 1000.0000 mg | ORAL_TABLET | Freq: Every day | ORAL | 11 refills | Status: DC

## 2019-10-27 ENCOUNTER — Ambulatory Visit (INDEPENDENT_AMBULATORY_CARE_PROVIDER_SITE_OTHER): Payer: Medicare Other | Admitting: Psychiatry

## 2019-10-27 ENCOUNTER — Other Ambulatory Visit: Payer: Self-pay

## 2019-10-27 ENCOUNTER — Encounter: Payer: Self-pay | Admitting: Psychiatry

## 2019-10-27 DIAGNOSIS — F411 Generalized anxiety disorder: Secondary | ICD-10-CM | POA: Diagnosis not present

## 2019-10-27 DIAGNOSIS — F3342 Major depressive disorder, recurrent, in full remission: Secondary | ICD-10-CM

## 2019-10-27 DIAGNOSIS — F900 Attention-deficit hyperactivity disorder, predominantly inattentive type: Secondary | ICD-10-CM | POA: Diagnosis not present

## 2019-10-27 MED ORDER — AMPHETAMINE-DEXTROAMPHETAMINE 10 MG PO TABS: 10.0000 mg | ORAL_TABLET | Freq: Every day | ORAL | 0 refills | Status: DC

## 2019-10-27 MED ORDER — AMPHETAMINE-DEXTROAMPHETAMINE 30 MG PO TABS: 30.0000 mg | ORAL_TABLET | Freq: Every day | ORAL | 0 refills | Status: DC

## 2019-10-27 NOTE — Progress Notes (Signed)
Provider Location : [name] Patient Location : Home  Virtual Visit via Video Note  I connected with [name] [name] on 10/27/19 at  2:00 PM EDT by a video enabled telemedicine application and verified that I am speaking with the correct person using two identifiers.   I discussed the limitations of evaluation and management by telemedicine and the availability of in person appointments. The patient expressed understanding and agreed to proceed.  I discussed the assessment and treatment plan with the patient. The patient was provided an opportunity to ask questions and all were answered. The patient agreed with the plan and demonstrated an understanding of the instructions.   The patient was advised to call back or seek an in-person evaluation if the symptoms worsen or if the condition fails to improve as anticipated.   [facility] MD OP Progress Note  10/27/2019 2:19 PM [name] [name]  MRN:  [id number]  Chief Complaint:  Chief Complaint    Follow-up     HPI: [name] [name] is a 39 year old Caucasian female, divorced, lives in [place], [place], has a history of ADHD, MDD in remission, GI problems likely achalasia was evaluated by telemedicine today.  Patient today reports she has had some situational anxiety since her last visit.  However overall she is able to manage it and cope with her stressors.  She reports overall she is doing okay.  Her attention and focus continues to be good.  She is sleeping well.  Her appetite is fair.  She reports she is able to manage the chores around the house as well as take care of her children.  Her mother continues to support her.  She recently broke up with her boyfriend however she reports she is coping with it okay and it does not bother her.  Patient denies any suicidality, homicidality or perceptual disturbances.  Patient denies any side effects to medications.  She is currently trying to find a therapist.  She reports she will reach out to a few  therapist and is motivated to start therapy sessions. Visit Diagnosis:    ICD-10-CM   1. Attention deficit hyperactivity disorder (ADHD), predominantly inattentive type  F90.0 amphetamine-dextroamphetamine (ADDERALL) 30 MG tablet    amphetamine-dextroamphetamine (ADDERALL) 30 MG tablet    amphetamine-dextroamphetamine (ADDERALL) 10 MG tablet    amphetamine-dextroamphetamine (ADDERALL) 10 MG tablet  2. MDD (major depressive disorder), recurrent, in full remission ([place])  F33.42   3. GAD (generalized anxiety disorder)  F41.1     Past Psychiatric History: I have reviewed past psychiatric history from my progress note on 04/14/2018.  Past trials of Cymbalta, Celexa, Pristiq, Effexor, Lamictal, Adderall, Ritalin, Abilify, benzodiazepines, Prozac  Past Medical History:  Past Medical History:  Diagnosis Date  . ADHD (attention deficit hyperactivity disorder)   . Anxiety   . Depression   . Family history of ovarian cancer    12/20 doesn't meet medicare genetic testing guidelines    Past Surgical History:  Procedure Laterality Date  . ABDOMINAL SURGERY    . CHOLECYSTECTOMY    . ESOPHAGECTOMY      Family Psychiatric History: I have reviewed family psychiatric history from my progress note on 04/14/2018  Family History:  Family History  Problem Relation Age of Onset  . Anxiety disorder Mother   . Ovarian cancer Mother   . Anxiety disorder Father   . ADD / ADHD Brother   . Anxiety disorder Brother   . Anxiety disorder Maternal Grandmother   . Ovarian cancer Maternal Grandmother   .  Ovarian cancer Maternal Aunt     Social History: I have reviewed social history from my progress note on 04/14/2018 Social History   Socioeconomic History  . Marital status: Single    Spouse name: Not on file  . Number of children: Not on file  . Years of education: Not on file  . Highest education level: Not on file  Occupational History  . Not on file  Tobacco Use  . Smoking status: Never Smoker  .  Smokeless tobacco: Never Used  Substance and Sexual Activity  . Alcohol use: No  . Drug use: Not Currently    Types: Marijuana    Comment: last used 6 mths ago  . Sexual activity: Yes    Partners: Male    Birth control/protection: Condom  Other Topics Concern  . Not on file  Social History Narrative  . Not on file   Social Determinants of Health   Financial Resource Strain:   . Difficulty of Paying Living Expenses:   Food Insecurity:   . Worried About Programme researcher, broadcasting/film/video in the Last Year:   . Barista in the Last Year:   Transportation Needs:   . Freight forwarder (Medical):   Marland Kitchen Lack of Transportation (Non-Medical):   Physical Activity:   . Days of Exercise per Week:   . Minutes of Exercise per Session:   Stress:   . Feeling of Stress :   Social Connections:   . Frequency of Communication with Friends and Family:   . Frequency of Social Gatherings with Friends and Family:   . Attends Religious Services:   . Active Member of Clubs or Organizations:   . Attends Banker Meetings:   Marland Kitchen Marital Status:     Allergies:  Allergies  Allergen Reactions  . Fentanyl Hives  . Metoclopramide Other (See Comments)  . Morphine Hives  . Butalbital-Apap-Caffeine Other (See Comments)    Paranoia  . Diazepam Other (See Comments)  . Penicillins   . Promethazine     seizures  . Latex Rash  . Lorazepam Other (See Comments)    combative    Metabolic Disorder Labs: No results found for: HGBA1C, MPG No results found for: PROLACTIN No results found for: CHOL, TRIG, HDL, CHOLHDL, VLDL, LDLCALC Lab Results  Component Value Date   TSH 0.733 06/19/2010   TSH 0.875 09/27/2009    Therapeutic Level Labs: No results found for: LITHIUM No results found for: VALPROATE No components found for:  CBMZ  Current Medications: Current Outpatient Medications  Medication Sig Dispense Refill  . amphetamine-dextroamphetamine (ADDERALL) 10 MG tablet Take 1 tablet (10 mg  total) by mouth daily after lunch. 30 tablet 0  . [START ON 11/18/2019] amphetamine-dextroamphetamine (ADDERALL) 10 MG tablet Take 1 tablet (10 mg total) by mouth daily after lunch. 30 tablet 0  . [START ON 12/16/2019] amphetamine-dextroamphetamine (ADDERALL) 10 MG tablet Take 1 tablet (10 mg total) by mouth daily. After lunch 30 tablet 0  . amphetamine-dextroamphetamine (ADDERALL) 30 MG tablet Take 1 tablet by mouth daily. 30 tablet 0  . [START ON 11/18/2019] amphetamine-dextroamphetamine (ADDERALL) 30 MG tablet Take 1 tablet by mouth daily. 30 tablet 0  . [START ON 12/16/2019] amphetamine-dextroamphetamine (ADDERALL) 30 MG tablet Take 1 tablet by mouth daily. 30 tablet 0  . fluconazole (DIFLUCAN) 150 MG tablet PLEASE SEE ATTACHED FOR DETAILED DIRECTIONS    . nitrofurantoin, macrocrystal-monohydrate, (MACROBID) 100 MG capsule Take 100 mg by mouth 2 (two) times daily.    Marland Kitchen  valACYclovir (VALTREX) 1000 MG tablet Take 1 tablet (1,000 mg total) by mouth daily. 30 tablet 11  . valACYclovir (VALTREX) 500 MG tablet Take 500 mg by mouth daily.     No current facility-administered medications for this visit.     Musculoskeletal: Strength & Muscle Tone: UTA Gait & Station: normal Patient leans: N/A  Psychiatric Specialty Exam: Review of Systems  Musculoskeletal: Positive for myalgias.  Psychiatric/Behavioral: Negative for agitation, behavioral problems, confusion, decreased concentration, dysphoric mood, hallucinations, self-injury, sleep disturbance and suicidal ideas. The patient is nervous/anxious. The patient is not hyperactive.   All other systems reviewed and are negative.   Last menstrual period 09/27/2019.There is no height or weight on file to calculate BMI.  General Appearance: Casual  Eye Contact:  Fair  Speech:  Clear and Coherent  Volume:  Normal  Mood:  Anxious  Affect:  Congruent  Thought Process:  Goal Directed and Descriptions of Associations: Intact  Orientation:  Full (Time, Place,  and Person)  Thought Content: Logical   Suicidal Thoughts:  No  Homicidal Thoughts:  No  Memory:  Immediate;   Fair Recent;   Fair Remote;   Fair  Judgement:  Fair  Insight:  Fair  Psychomotor Activity:  Normal  Concentration:  Concentration: Fair and Attention Span: Fair  Recall:  [employer] of Knowledge: Fair  Language: Fair  Akathisia:  No  Handed:  Right  AIMS (if indicated): UTA  Assets:  Communication Skills Desire for Improvement Housing Social Support  ADL's:  Intact  Cognition: WNL  Sleep:  Fair   Screenings:   Assessment and Plan: [name] [name] is a 39 year old Caucasian female who has a history of depression, anxiety, ADD, esophageal achalasia was evaluated by telemedicine today.  Patient is currently stable on the Adderall with regards to her ADD.  Patient with situational stressors however is coping okay and will benefit from psychotherapy sessions.  Plan as noted below.  Plan ADD-stable Adderall 30 mg p.o. daily Adderall 10 mg p.o. daily I have provided 2 prescriptions with date specified last 1 to be filled on or after 12/16/2019. I have reviewed [place] controlled substance database.  History of MDD/GAD-stable Patient is currently trying to find a psychotherapist.  Encouraged her to start psychotherapy sessions.  Follow-up in clinic in 8 weeks or sooner if needed.  I have spent atleast 15 minutes non face to face with patient today. More than 50 % of the time was spent for preparing to see the patient ( e.g., review of test, records ), obtaining and to review and separately obtained history , ordering medications and test ,psychoeducation and supportive psychotherapy and care coordination,as well as documenting clinical information in electronic health record. This note was generated in part or whole with voice recognition software. Voice recognition is usually quite accurate but there are transcription errors that can and very often do occur. I apologize for any  typographical errors that were not detected and corrected.       Jomarie Longs, MD 10/27/2019, 2:19 PM

## 2019-12-21 ENCOUNTER — Telehealth (INDEPENDENT_AMBULATORY_CARE_PROVIDER_SITE_OTHER): Payer: Medicare Other | Admitting: Psychiatry

## 2019-12-21 ENCOUNTER — Other Ambulatory Visit: Payer: Self-pay

## 2019-12-21 ENCOUNTER — Encounter: Payer: Self-pay | Admitting: Psychiatry

## 2019-12-21 DIAGNOSIS — F3342 Major depressive disorder, recurrent, in full remission: Secondary | ICD-10-CM | POA: Diagnosis not present

## 2019-12-21 DIAGNOSIS — F900 Attention-deficit hyperactivity disorder, predominantly inattentive type: Secondary | ICD-10-CM | POA: Diagnosis not present

## 2019-12-21 DIAGNOSIS — F411 Generalized anxiety disorder: Secondary | ICD-10-CM

## 2019-12-21 NOTE — Progress Notes (Signed)
Provider Location : [name] Patient Location : Home  Virtual Visit via Video Note  I connected with [name] on 12/21/19 at  2:00 PM EDT by a video enabled telemedicine application and verified that I am speaking with the correct person using two identifiers.   I discussed the limitations of evaluation and management by telemedicine and the availability of in person appointments. The patient expressed understanding and agreed to proceed.     I discussed the assessment and treatment plan with the patient. The patient was provided an opportunity to ask questions and all were answered. The patient agreed with the plan and demonstrated an understanding of the instructions.   The patient was advised to call back or seek an in-person evaluation if the symptoms worsen or if the condition fails to improve as anticipated.   [facility] OP Progress Note  12/21/2019 3:01 PM DALYAH PLA  MRN:  [id number]  Chief Complaint:  Chief Complaint    Follow-up     HPI: [name] [name] is a 39 year old Caucasian female, divorced, lives in [place], has a history of ADHD, MDD in remission, GI problems likely achalasia was evaluated by telemedicine today.  Patient today reports she is currently doing well.  She reports she continues to have psychosocial stressors of relationship struggles, the pandemic, her children's health issues and so on.  She however reports she has been able to cope with it better than before.  She reports she has started psychotherapy sessions with a therapist with [facility].  She reports she has been seeing her on a weekly basis since the past 2 months or so.  She reports her therapy sessions as very beneficial.  She reports sleep as good.  She continues to take the Adderall for her ADD symptoms and is currently doing well.  She denies any side effects.  She continues to not be in a relationship at this time, she had a recent break-up with her ex-boyfriend.  She  seems to be coping okay with that.  She reports she currently wants to focus on getting better as well as wants to focus on her children's mental health and is not currently interested in pursuing another relationship.  She reports her children are also currently in therapy.  The therapist have recommended that she also find a family therapist who will be able to work  with her and her children together.  She is currently working on the same.  Patient denies any suicidality, homicidality or perceptual disturbances.  Patient denies any other concerns today. Visit Diagnosis:    ICD-10-CM   1. Attention deficit hyperactivity disorder (ADHD), predominantly inattentive type  F90.0   2. MDD (major depressive disorder), recurrent, in full remission ([facility])  F33.42   3. GAD (generalized anxiety disorder)  F41.1     Past Psychiatric History: I have reviewed past psychiatric history from my progress note on 04/14/2018.  Past trials of Cymbalta, Celexa, Pristiq, Effexor, Lamictal, Adderall, Ritalin, Abilify, benzodiazepines, Prozac.  Past Medical History:  Past Medical History:  Diagnosis Date  . ADHD (attention deficit hyperactivity disorder)   . Anxiety   . Depression   . Family history of ovarian cancer    12/20 doesn't meet medicare genetic testing guidelines    Past Surgical History:  Procedure Laterality Date  . ABDOMINAL SURGERY    . CHOLECYSTECTOMY    . ESOPHAGECTOMY      Family Psychiatric History: I have reviewed family psychiatric history from my progress  note on 04/14/2018.  Family History:  Family History  Problem Relation Age of Onset  . Anxiety disorder Mother   . Ovarian cancer Mother   . Anxiety disorder Father   . ADD / ADHD Brother   . Anxiety disorder Brother   . Anxiety disorder Maternal Grandmother   . Ovarian cancer Maternal Grandmother   . Ovarian cancer Maternal Aunt     Social History: Reviewed social history from my progress note on 04/14/2018. Social History    Socioeconomic History  . Marital status: Single    Spouse name: Not on file  . Number of children: Not on file  . Years of education: Not on file  . Highest education level: Not on file  Occupational History  . Not on file  Tobacco Use  . Smoking status: Never Smoker  . Smokeless tobacco: Never Used  Substance and Sexual Activity  . Alcohol use: No  . Drug use: Not Currently    Types: Marijuana    Comment: last used 6 mths ago  . Sexual activity: Yes    Partners: Male    Birth control/protection: Condom  Other Topics Concern  . Not on file  Social History Narrative  . Not on file   Social Determinants of Health   Financial Resource Strain:   . Difficulty of Paying Living Expenses:   Food Insecurity:   . Worried About Programme researcher, broadcasting/film/video in the Last Year:   . Barista in the Last Year:   Transportation Needs:   . Freight forwarder (Medical):   Marland Kitchen Lack of Transportation (Non-Medical):   Physical Activity:   . Days of Exercise per Week:   . Minutes of Exercise per Session:   Stress:   . Feeling of Stress :   Social Connections:   . Frequency of Communication with Friends and Family:   . Frequency of Social Gatherings with Friends and Family:   . Attends Religious Services:   . Active Member of Clubs or Organizations:   . Attends Banker Meetings:   Marland Kitchen Marital Status:     Allergies:  Allergies  Allergen Reactions  . Fentanyl Hives  . Metoclopramide Other (See Comments)  . Morphine Hives  . Butalbital-Apap-Caffeine Other (See Comments)    Paranoia  . Diazepam Other (See Comments)  . Penicillins   . Promethazine     seizures  . Latex Rash  . Lorazepam Other (See Comments)    combative    Metabolic Disorder Labs: No results found for: HGBA1C, MPG No results found for: PROLACTIN No results found for: CHOL, TRIG, HDL, CHOLHDL, VLDL, LDLCALC Lab Results  Component Value Date   TSH 0.733 06/19/2010   TSH 0.875 09/27/2009     Therapeutic Level Labs: No results found for: LITHIUM No results found for: VALPROATE No components found for:  CBMZ  Current Medications: Current Outpatient Medications  Medication Sig Dispense Refill  . amphetamine-dextroamphetamine (ADDERALL) 10 MG tablet Take 1 tablet (10 mg total) by mouth daily after lunch. 30 tablet 0  . amphetamine-dextroamphetamine (ADDERALL) 10 MG tablet Take 1 tablet (10 mg total) by mouth daily after lunch. 30 tablet 0  . amphetamine-dextroamphetamine (ADDERALL) 10 MG tablet Take 1 tablet (10 mg total) by mouth daily. After lunch 30 tablet 0  . amphetamine-dextroamphetamine (ADDERALL) 30 MG tablet Take 1 tablet by mouth daily. 30 tablet 0  . amphetamine-dextroamphetamine (ADDERALL) 30 MG tablet Take 1 tablet by mouth daily. 30 tablet 0  .  amphetamine-dextroamphetamine (ADDERALL) 30 MG tablet Take 1 tablet by mouth daily. 30 tablet 0  . DEXILANT 60 MG capsule Take 1 capsule by mouth daily.    . fluconazole (DIFLUCAN) 150 MG tablet PLEASE SEE ATTACHED FOR DETAILED DIRECTIONS    . nitrofurantoin, macrocrystal-monohydrate, (MACROBID) 100 MG capsule Take 100 mg by mouth 2 (two) times daily.    . valACYclovir (VALTREX) 500 MG tablet Take 500 mg by mouth daily.     No current facility-administered medications for this visit.     Musculoskeletal: Strength & Muscle Tone: UTA Gait & Station: normal Patient leans: N/A  Psychiatric Specialty Exam: Review of Systems  Psychiatric/Behavioral: Negative for agitation, behavioral problems, confusion, decreased concentration, dysphoric mood, hallucinations, self-injury, sleep disturbance and suicidal ideas. The patient is not nervous/anxious and is not hyperactive.   All other systems reviewed and are negative.   There were no vitals taken for this visit.There is no height or weight on file to calculate BMI.  General Appearance: Casual  Eye Contact:  Fair  Speech:  Clear and Coherent  Volume:  Normal  Mood:   Euthymic  Affect:  Congruent  Thought Process:  Goal Directed and Descriptions of Associations: Intact  Orientation:  Full (Time, Place, and Person)  Thought Content: Logical   Suicidal Thoughts:  No  Homicidal Thoughts:  No  Memory:  Immediate;   Fair Recent;   Fair Remote;   Fair  Judgement:  Fair  Insight:  Fair  Psychomotor Activity:  Normal  Concentration:  Concentration: Fair and Attention Span: Fair  Recall:  [employer] of Knowledge: Fair  Language: Fair  Akathisia:  No  Handed:  Right  AIMS (if indicated): UTA  Assets:  Communication Skills Desire for Improvement Housing Social Support  ADL's:  Intact  Cognition: WNL  Sleep:  Fair   Screenings:   Assessment and Plan: [name] [name] is a 39 year old Caucasian female who has a history of depression, anxiety, ADD, esophageal achalasia was evaluated by telemedicine today.  Patient continues to remain stable on the Adderall.  Patient also is in psychotherapy sessions and reports therapy sessions as beneficial.  Patient with psychosocial stressors of relationship struggles and her own health issues and her children's health problems will continue to benefit from supportive psychotherapy/CBT.  Plan as noted below.  Plan ADD-stable Adderall 30 mg p.o. daily Adderall 10 mg p.o. daily. I have reviewed [place] controlled substance database.  History of MDD/GAD-stable She will continue to follow-up with therapist.  She has [facility] care with [facility]-Ms. Rayetta Pigg.  Patient to continue weekly psychotherapy sessions.  Patient is also currently looking for a family therapist who will be able to provide therapy for her and her children together.  Provided supportive psychotherapy, discussed to continue therapy, and to monitor herself and to continue to work on her coping skills.  Follow-up in clinic in 2 months or sooner if needed.  I have spent atleast 19 minutes non face to face with patient today. More than 50 % of  the time was spent for preparing to see the patient ( e.g., review of test, records ),ordering medications and test ,psychoeducation and supportive psychotherapy and care coordination,as well as documenting clinical information in electronic health record. This note was generated in part or whole with voice recognition software. Voice recognition is usually quite accurate but there are transcription errors that can and very often do occur. I apologize for any typographical errors that were not detected and corrected.  Ursula Alert, MD 12/21/2019, 3:01 PM

## 2020-02-02 ENCOUNTER — Telehealth: Payer: Self-pay

## 2020-02-02 DIAGNOSIS — F900 Attention-deficit hyperactivity disorder, predominantly inattentive type: Secondary | ICD-10-CM

## 2020-02-02 MED ORDER — AMPHETAMINE-DEXTROAMPHETAMINE 30 MG PO TABS: 30.0000 mg | ORAL_TABLET | Freq: Every day | ORAL | 0 refills | Status: DC

## 2020-02-02 MED ORDER — AMPHETAMINE-DEXTROAMPHETAMINE 10 MG PO TABS: 10.0000 mg | ORAL_TABLET | Freq: Every day | ORAL | 0 refills | Status: DC

## 2020-02-02 NOTE — Telephone Encounter (Signed)
pt called states she is completely out of her medications.

## 2020-02-02 NOTE — Telephone Encounter (Signed)
I have sent Adderall to pharmacy-58-month supply with date specified last 1 to be filled on or after 03/01/2020.

## 2020-02-14 ENCOUNTER — Other Ambulatory Visit: Payer: Self-pay

## 2020-02-14 ENCOUNTER — Telehealth: Payer: Medicare Other | Admitting: Psychiatry

## 2020-03-19 ENCOUNTER — Encounter: Payer: Self-pay | Admitting: Psychiatry

## 2020-03-19 ENCOUNTER — Telehealth (INDEPENDENT_AMBULATORY_CARE_PROVIDER_SITE_OTHER): Payer: Medicare Other | Admitting: Psychiatry

## 2020-03-19 ENCOUNTER — Other Ambulatory Visit: Payer: Self-pay

## 2020-03-19 DIAGNOSIS — F411 Generalized anxiety disorder: Secondary | ICD-10-CM

## 2020-03-19 DIAGNOSIS — F900 Attention-deficit hyperactivity disorder, predominantly inattentive type: Secondary | ICD-10-CM

## 2020-03-19 DIAGNOSIS — F3342 Major depressive disorder, recurrent, in full remission: Secondary | ICD-10-CM

## 2020-03-19 MED ORDER — AMPHETAMINE-DEXTROAMPHETAMINE 30 MG PO TABS: 30.0000 mg | ORAL_TABLET | Freq: Every day | ORAL | 0 refills | Status: AC

## 2020-03-19 MED ORDER — AMPHETAMINE-DEXTROAMPHETAMINE 10 MG PO TABS: 10.0000 mg | ORAL_TABLET | Freq: Every day | ORAL | 0 refills | Status: DC

## 2020-03-19 NOTE — Progress Notes (Signed)
Provider Location : [name] Patient Location : Home  Virtual Visit via Video Note  I connected with [name] on 03/19/20 at 10:30 AM EDT by a video enabled telemedicine application and verified that I am speaking with the correct person using two identifiers.   I discussed the limitations of evaluation and management by telemedicine and the availability of in person appointments. The patient expressed understanding and agreed to proceed.   I discussed the assessment and treatment plan with the patient. The patient was provided an opportunity to ask questions and all were answered. The patient agreed with the plan and demonstrated an understanding of the instructions.   The patient was advised to call back or seek an in-person evaluation if the symptoms worsen or if the condition fails to improve as anticipated.   BH MD OP Progress Note  03/19/2020 7:19 PM TIRZA SENTENO  MRN:  [id number]  Chief Complaint:  Chief Complaint    Follow-up     HPI: [name] [name] is a 39 year old Caucasian female, divorced, lives in [place], on [place], has a history of ADHD, MDD in remission, GI problems like achalasia was evaluated by telemedicine today.  Patient today reports she is currently doing well.  She denies any mood lability.  She denies any anxiety or depressive symptoms.  She reports her attention and focus is good on the Adderall.  She continues to be compliant and denies side effects.  Patient reports sleep is good.  Patient denies any side effects to any of her medications.  Patient reports she continues to follow-up with her therapist and reports therapy sessions as going well.  She has also started family therapy and that also seems to be helpful.  Patient denies any suicidality, homicidality or perceptual disturbances.  Patient denies any substance abuse problems.  Visit Diagnosis:    ICD-10-CM   1. Attention deficit hyperactivity disorder (ADHD), predominantly inattentive  type  F90.0 amphetamine-dextroamphetamine (ADDERALL) 10 MG tablet    amphetamine-dextroamphetamine (ADDERALL) 10 MG tablet    amphetamine-dextroamphetamine (ADDERALL) 10 MG tablet    amphetamine-dextroamphetamine (ADDERALL) 30 MG tablet    amphetamine-dextroamphetamine (ADDERALL) 30 MG tablet    amphetamine-dextroamphetamine (ADDERALL) 30 MG tablet  2. MDD (major depressive disorder), recurrent, in full remission (HCC)  F33.42   3. GAD (generalized anxiety disorder)  F41.1     Past Psychiatric History: I have reviewed past psychiatric history from my progress note on 04/14/2018.  Past trials of Cymbalta, Celexa, Pristiq, Effexor, Lamictal, Adderall, Ritalin, Abilify, benzodiazepines, Prozac  Past Medical History:  Past Medical History:  Diagnosis Date  . ADHD (attention deficit hyperactivity disorder)   . Anxiety   . Depression   . Family history of ovarian cancer    12/20 doesn't meet medicare genetic testing guidelines    Past Surgical History:  Procedure Laterality Date  . ABDOMINAL SURGERY    . CHOLECYSTECTOMY    . ESOPHAGECTOMY      Family Psychiatric History: I have reviewed family psychiatric history from my progress note on 04/14/2018  Family History:  Family History  Problem Relation Age of Onset  . Anxiety disorder Mother   . Ovarian cancer Mother   . Anxiety disorder Father   . ADD / ADHD Brother   . Anxiety disorder Brother   . Anxiety disorder Maternal Grandmother   . Ovarian cancer Maternal Grandmother   . Ovarian cancer Maternal Aunt     Social History: I have reviewed social history from my progress note on 04/14/2018  Social History   Socioeconomic History  . Marital status: Single    Spouse name: Not on file  . Number of children: Not on file  . Years of education: Not on file  . Highest education level: Not on file  Occupational History  . Not on file  Tobacco Use  . Smoking status: Never Smoker  . Smokeless tobacco: Never Used  Vaping Use  .  Vaping Use: Never used  Substance and Sexual Activity  . Alcohol use: No  . Drug use: Not Currently    Types: Marijuana    Comment: last used 6 mths ago  . Sexual activity: Yes    Partners: Male    Birth control/protection: Condom  Other Topics Concern  . Not on file  Social History Narrative  . Not on file   Social Determinants of Health   Financial Resource Strain:   . Difficulty of Paying Living Expenses:   Food Insecurity:   . Worried About Programme researcher, broadcasting/film/video in the Last Year:   . Barista in the Last Year:   Transportation Needs:   . Freight forwarder (Medical):   Marland Kitchen Lack of Transportation (Non-Medical):   Physical Activity:   . Days of Exercise per Week:   . Minutes of Exercise per Session:   Stress:   . Feeling of Stress :   Social Connections:   . Frequency of Communication with Friends and Family:   . Frequency of Social Gatherings with Friends and Family:   . Attends Religious Services:   . Active Member of Clubs or Organizations:   . Attends Banker Meetings:   Marland Kitchen Marital Status:     Allergies:  Allergies  Allergen Reactions  . Fentanyl Hives  . Metoclopramide Other (See Comments)  . Morphine Hives  . Butalbital-Apap-Caffeine Other (See Comments)    Paranoia  . Diazepam Other (See Comments)  . Penicillins   . Promethazine     seizures  . Latex Rash  . Lorazepam Other (See Comments)    combative    Metabolic Disorder Labs: No results found for: HGBA1C, MPG No results found for: PROLACTIN No results found for: CHOL, TRIG, HDL, CHOLHDL, VLDL, LDLCALC Lab Results  Component Value Date   TSH 0.733 06/19/2010   TSH 0.875 09/27/2009    Therapeutic Level Labs: No results found for: LITHIUM No results found for: VALPROATE No components found for:  CBMZ  Current Medications: Current Outpatient Medications  Medication Sig Dispense Refill  . albendazole (ALBENZA) 200 MG tablet Take by mouth.    Melene Muller ON 04/05/2020]  amphetamine-dextroamphetamine (ADDERALL) 10 MG tablet Take 1 tablet (10 mg total) by mouth daily after lunch. 30 tablet 0  . [START ON 05/04/2020] amphetamine-dextroamphetamine (ADDERALL) 10 MG tablet Take 1 tablet (10 mg total) by mouth daily after lunch. 30 tablet 0  . [START ON 06/02/2020] amphetamine-dextroamphetamine (ADDERALL) 10 MG tablet Take 1 tablet (10 mg total) by mouth daily. After lunch 30 tablet 0  . [START ON 04/05/2020] amphetamine-dextroamphetamine (ADDERALL) 30 MG tablet Take 1 tablet by mouth daily. 30 tablet 0  . [START ON 05/04/2020] amphetamine-dextroamphetamine (ADDERALL) 30 MG tablet Take 1 tablet by mouth daily. 30 tablet 0  . [START ON 06/02/2020] amphetamine-dextroamphetamine (ADDERALL) 30 MG tablet Take 1 tablet by mouth daily. 30 tablet 0  . DEXILANT 60 MG capsule Take 1 capsule by mouth daily.    Marland Kitchen erythromycin ophthalmic ointment 4 (four) times daily.    . fluconazole (  DIFLUCAN) 150 MG tablet PLEASE SEE ATTACHED FOR DETAILED DIRECTIONS    . nitrofurantoin, macrocrystal-monohydrate, (MACROBID) 100 MG capsule Take 100 mg by mouth 2 (two) times daily.    . valACYclovir (VALTREX) 1000 MG tablet Take 1,000 mg by mouth daily.    . valACYclovir (VALTREX) 500 MG tablet Take 500 mg by mouth daily.     No current facility-administered medications for this visit.     Musculoskeletal: Strength & Muscle Tone: UTA Gait & Station: normal Patient leans: N/A  Psychiatric Specialty Exam: Review of Systems  Psychiatric/Behavioral: Negative for agitation, behavioral problems, confusion, decreased concentration, dysphoric mood, hallucinations, self-injury, sleep disturbance and suicidal ideas. The patient is not nervous/anxious and is not hyperactive.   All other systems reviewed and are negative.   There were no vitals taken for this visit.There is no height or weight on file to calculate BMI.  General Appearance: Casual  Eye Contact:  Fair  Speech:  Clear and Coherent   Volume:  Normal  Mood:  Euthymic  Affect:  Congruent  Thought Process:  Goal Directed and Descriptions of Associations: Intact  Orientation:  Full (Time, Place, and Person)  Thought Content: Logical   Suicidal Thoughts:  No  Homicidal Thoughts:  No  Memory:  Immediate;   Fair Recent;   Fair Remote;   Fair  Judgement:  Fair  Insight:  Fair  Psychomotor Activity:  Normal  Concentration:  Concentration: Fair and Attention Span: Fair  Recall:  [employer] of Knowledge: Fair  Language: Fair  Akathisia:  No  Handed:  Right  AIMS (if indicated): UTA  Assets:  Communication Skills Desire for Improvement Housing Social Support  ADL's:  Intact  Cognition: WNL  Sleep:  Fair   Screenings:   Assessment and Plan: JUNKO OHAGAN is a 39 year old Caucasian female who has a history of depression, anxiety, ADD, esophageal achalasia was evaluated by telemedicine today.  Patient is currently doing well on the current medication regimen.  Plan as noted below.  Plan ADD-stable Adderall 30 mg p.o. daily Adderall 10 mg p.o. daily I have reviewed [place] controlled substance database. I have provided 3 prescriptions to last 1 to be filled on or after 06/02/2020  History of MDD/GAD-stable She will continue to follow-up with her therapist. We will continue to coordinate care.  Follow-up in clinic in 3 months or sooner if needed.  I have spent atleast 19 minutes non face to face with patient today. More than 50 % of the time was spent for preparing to see the patient ( e.g., review of test, records ),  ordering medications and test ,psychoeducation and supportive psychotherapy and care coordination,as well as documenting clinical information in electronic health record. This note was generated in part or whole with voice recognition software. Voice recognition is usually quite accurate but there are transcription errors that can and very often do occur. I apologize for any typographical errors that  were not detected and corrected.      Jomarie Longs, MD 03/19/2020, 7:19 PM

## 2020-06-19 ENCOUNTER — Telehealth (INDEPENDENT_AMBULATORY_CARE_PROVIDER_SITE_OTHER): Payer: Medicare Other | Admitting: Psychiatry

## 2020-06-19 ENCOUNTER — Other Ambulatory Visit: Payer: Self-pay

## 2020-06-19 ENCOUNTER — Encounter: Payer: Self-pay | Admitting: Psychiatry

## 2020-06-19 DIAGNOSIS — F3342 Major depressive disorder, recurrent, in full remission: Secondary | ICD-10-CM

## 2020-06-19 DIAGNOSIS — F900 Attention-deficit hyperactivity disorder, predominantly inattentive type: Secondary | ICD-10-CM

## 2020-06-19 DIAGNOSIS — F411 Generalized anxiety disorder: Secondary | ICD-10-CM

## 2020-06-19 NOTE — Progress Notes (Signed)
Virtual Visit via Video Note  I connected with [name] [name] on 06/19/20 at  2:00 PM EST by a video enabled telemedicine application and verified that I am speaking with the correct person using two identifiers.  Location Provider Location : [name] Patient Location : Home  Participants: Patient , Provider   I discussed the limitations of evaluation and management by telemedicine and the availability of in person appointments. The patient expressed understanding and agreed to proceed.    I discussed the assessment and treatment plan with the patient. The patient was provided an opportunity to ask questions and all were answered. The patient agreed with the plan and demonstrated an understanding of the instructions.   The patient was advised to call back or seek an in-person evaluation if the symptoms worsen or if the condition fails to improve as anticipated.   BH MD OP Progress Note  06/19/2020 3:44 PM [name] [name]  MRN:  [id number]  Chief Complaint:  Chief Complaint    Follow-up     HPI: [name] [name] is a 39 year old Caucasian female, divorced, lives in [place], on [place], has a history of ADHD, MDD in remission, GI problems like achalasia was evaluated by telemedicine today.  Patient reports she does have mood swings, has good days and bad days.  She however reports she is still functioning and it does not affect her much.  She is coping with it.  She does have psychosocial stressors and her situations does make her depressed at times.  She however believes that is normal and she does not need to be on medications at this time.  She is currently working with her therapist and is planning to start EMDR for her trauma.  Patient is compliant on her medications for ADHD, Adderall.  Denies side effects.  Patient reports she does have multiple medical problems including anemia and wonders whether she can be started on vitamin supplements.  Patient denies any suicidality,  homicidality or perceptual disturbances.  Patient denies any other concerns today.  Visit Diagnosis:    ICD-10-CM   1. Attention deficit hyperactivity disorder (ADHD), predominantly inattentive type  F90.0   2. MDD (major depressive disorder), recurrent, in full remission (HCC)  F33.42   3. GAD (generalized anxiety disorder)  F41.1     Past Psychiatric History: I have reviewed past psychiatric history from my progress note on 04/14/2018.  Past trials of Cymbalta, Celexa, Pristiq, Effexor, Lamictal, Adderall, Ritalin, Abilify, benzodiazepines, Prozac  Past Medical History:  Past Medical History:  Diagnosis Date  . ADHD (attention deficit hyperactivity disorder)   . Anxiety   . Depression   . Family history of ovarian cancer    12/20 doesn't meet medicare genetic testing guidelines    Past Surgical History:  Procedure Laterality Date  . ABDOMINAL SURGERY    . CHOLECYSTECTOMY    . ESOPHAGECTOMY      Family Psychiatric History: I have reviewed family psychiatric history from my progress note on 04/14/2018.  Family History:  Family History  Problem Relation Age of Onset  . Anxiety disorder Mother   . Ovarian cancer Mother   . Anxiety disorder Father   . ADD / ADHD Brother   . Anxiety disorder Brother   . Anxiety disorder Maternal Grandmother   . Ovarian cancer Maternal Grandmother   . Ovarian cancer Maternal Aunt     Social History: I have reviewed social history from my progress note on 04/14/2018. Social History   Socioeconomic History  .  Marital status: Divorced    Spouse name: Not on file  . Number of children: Not on file  . Years of education: Not on file  . Highest education level: Not on file  Occupational History  . Not on file  Tobacco Use  . Smoking status: Never Smoker  . Smokeless tobacco: Never Used  Vaping Use  . Vaping Use: Never used  Substance and Sexual Activity  . Alcohol use: No  . Drug use: Not Currently    Types: Marijuana    Comment: last  used 6 mths ago  . Sexual activity: Yes    Partners: Male    Birth control/protection: Condom  Other Topics Concern  . Not on file  Social History Narrative  . Not on file   Social Determinants of Health   Financial Resource Strain:   . Difficulty of Paying Living Expenses: Not on file  Food Insecurity:   . Worried About Programme researcher, broadcasting/film/video in the Last Year: Not on file  . Ran Out of Food in the Last Year: Not on file  Transportation Needs:   . Lack of Transportation (Medical): Not on file  . Lack of Transportation (Non-Medical): Not on file  Physical Activity:   . Days of Exercise per Week: Not on file  . Minutes of Exercise per Session: Not on file  Stress:   . Feeling of Stress : Not on file  Social Connections:   . Frequency of Communication with Friends and Family: Not on file  . Frequency of Social Gatherings with Friends and Family: Not on file  . Attends Religious Services: Not on file  . Active Member of Clubs or Organizations: Not on file  . Attends Banker Meetings: Not on file  . Marital Status: Not on file    Allergies:  Allergies  Allergen Reactions  . Fentanyl Hives  . Metoclopramide Other (See Comments)  . Morphine Hives  . Butalbital-Apap-Caffeine Other (See Comments)    Paranoia  . Diazepam Other (See Comments)  . Penicillins   . Promethazine     seizures  . Latex Rash  . Lorazepam Other (See Comments)    combative    Metabolic Disorder Labs: No results found for: HGBA1C, MPG No results found for: PROLACTIN No results found for: CHOL, TRIG, HDL, CHOLHDL, VLDL, LDLCALC Lab Results  Component Value Date   TSH 0.733 06/19/2010   TSH 0.875 09/27/2009    Therapeutic Level Labs: No results found for: LITHIUM No results found for: VALPROATE No components found for:  CBMZ  Current Medications: Current Outpatient Medications  Medication Sig Dispense Refill  . albendazole (ALBENZA) 200 MG tablet Take by mouth.    Marland Kitchen  amphetamine-dextroamphetamine (ADDERALL) 10 MG tablet Take 1 tablet (10 mg total) by mouth daily after lunch. 30 tablet 0  . amphetamine-dextroamphetamine (ADDERALL) 10 MG tablet Take 1 tablet (10 mg total) by mouth daily after lunch. 30 tablet 0  . amphetamine-dextroamphetamine (ADDERALL) 10 MG tablet Take 1 tablet (10 mg total) by mouth daily. After lunch 30 tablet 0  . amphetamine-dextroamphetamine (ADDERALL) 30 MG tablet Take 1 tablet by mouth daily. 30 tablet 0  . amphetamine-dextroamphetamine (ADDERALL) 30 MG tablet Take 1 tablet by mouth daily. 30 tablet 0  . amphetamine-dextroamphetamine (ADDERALL) 30 MG tablet Take 1 tablet by mouth daily. 30 tablet 0  . DEXILANT 60 MG capsule Take 1 capsule by mouth daily.    Marland Kitchen erythromycin ophthalmic ointment 4 (four) times daily.    Marland Kitchen  fluconazole (DIFLUCAN) 150 MG tablet PLEASE SEE ATTACHED FOR DETAILED DIRECTIONS    . nitrofurantoin, macrocrystal-monohydrate, (MACROBID) 100 MG capsule Take 100 mg by mouth 2 (two) times daily.    . valACYclovir (VALTREX) 1000 MG tablet Take 1,000 mg by mouth daily.    . valACYclovir (VALTREX) 500 MG tablet Take 500 mg by mouth daily.     No current facility-administered medications for this visit.     Musculoskeletal: Strength & Muscle Tone: UTA Gait & Station: normal Patient leans: N/A  Psychiatric Specialty Exam: Review of Systems  Psychiatric/Behavioral: Positive for dysphoric mood.  All other systems reviewed and are negative.   There were no vitals taken for this visit.There is no height or weight on file to calculate BMI.  General Appearance: Casual  Eye Contact:  [name]  Speech:  Clear and Coherent  Volume:  Normal  Mood:  Depressed coping well  Affect:  Congruent  Thought Process:  Goal Directed and Descriptions of Associations: Intact  Orientation:  Full (Time, Place, and Person)  Thought Content: Logical   Suicidal Thoughts:  No  Homicidal Thoughts:  No  Memory:  Immediate;   [name] Recent;    [name] Remote;   [name]  Judgement:  [name]  Insight:  [name]  Psychomotor Activity:  Normal  Concentration:  Concentration: [name] and Attention Span: [name]  Recall:  [name] [name]  Language: [name]  Akathisia:  No  Handed:  Right  AIMS (if indicated): UTA  Assets:  Communication Skills Desire for Improvement Housing Social Support  ADL's:  Intact  Cognition: WNL  Sleep:  [name]   Screenings:   Assessment and Plan: [name] [name] is a 39 year old Caucasian female who has a history of depression, anxiety, ADD, esophageal achalasia was evaluated by telemedicine today.  Patient is currently doing well however does have some mood symptoms however reports overall she is coping well and is not interested in medication changes.  She wants to continue psychotherapy sessions.  Plan as noted below.  Plan ADD-stable Adderall 30 mg p.o. daily Adderall 10 mg p.o. daily Patient has 1 prescription pending at the pharmacy to be filled on or after 06/02/2020.  Depression unspecified-we will monitor closely. Patient reports she currently does not have any significant mood swings however does have situational mood changes however is overall coping well.  She is not interested in medications.  Patient advised to continue CBT.  She is also planning to start EMDR.  Follow-up in clinic in 4 weeks or sooner if needed.  I have spent atleast 20 minutes face to face by video with patient today. More than 50 % of the time was spent for preparing to see the patient ( e.g., review of test, records ), ordering medications and test ,psychoeducation and supportive psychotherapy and care coordination,as well as documenting clinical information in electronic health record. This note was generated in part or whole with voice recognition software. Voice recognition is usually quite accurate but there are transcription errors that can and very often do occur. I apologize for any typographical errors that were  not detected and corrected.        Jomarie Longs, MD 06/20/2020, 8:22 AM

## 2020-07-10 DIAGNOSIS — Z9149 Other personal history of psychological trauma, not elsewhere classified: Secondary | ICD-10-CM | POA: Insufficient documentation

## 2020-07-20 ENCOUNTER — Telehealth: Payer: Medicare Other | Admitting: Psychiatry

## 2020-08-09 ENCOUNTER — Telehealth: Payer: Self-pay

## 2020-08-09 NOTE — Telephone Encounter (Signed)
Pt calling; doesn't have esophagus; [facility] wants her on Prenate Restore pnv - folate broken down and omega 3.  Do we have samples?  Pt has M'caid and M'care.  Samples of Prenate Mini and Pixie along with discount card for any rx put up front for pt to p/u.  Adv pt to go to [facility] as well.  Pt is trying not to have a feeding tube put in.

## 2020-09-05 ENCOUNTER — Other Ambulatory Visit: Payer: Self-pay | Admitting: Obstetrics & Gynecology

## 2020-09-24 ENCOUNTER — Other Ambulatory Visit: Payer: Self-pay | Admitting: Obstetrics & Gynecology

## 2020-09-29 ENCOUNTER — Other Ambulatory Visit: Payer: Self-pay | Admitting: Obstetrics & Gynecology

## 2020-11-22 ENCOUNTER — Other Ambulatory Visit: Payer: Self-pay | Admitting: Obstetrics & Gynecology

## 2020-12-12 ENCOUNTER — Telehealth: Payer: Self-pay

## 2020-12-12 NOTE — Progress Notes (Signed)
Patient is not being dismissed on bad terms. Patient has another provider so her services with Dr. Elna Breslow Is no longer needed.

## 2020-12-14 NOTE — Telephone Encounter (Signed)
error 

## 2021-08-09 ENCOUNTER — Other Ambulatory Visit: Payer: Self-pay | Admitting: Obstetrics & Gynecology

## 2021-08-25 ENCOUNTER — Other Ambulatory Visit: Payer: Self-pay | Admitting: Obstetrics & Gynecology

## 2021-10-03 ENCOUNTER — Other Ambulatory Visit: Payer: Self-pay | Admitting: Obstetrics & Gynecology

## 2021-10-31 ENCOUNTER — Other Ambulatory Visit: Payer: Self-pay | Admitting: Obstetrics & Gynecology

## 2021-11-24 IMAGING — MG DIGITAL SCREENING BILAT W/ TOMO W/ CAD
6 of 12 series · 6 of 36 positions shown · non-contrast
Comparison: None.

CLINICAL DATA: Screening.

EXAM:
DIGITAL SCREENING BILATERAL MAMMOGRAM WITH TOMO AND CAD

[R MLO synth-2D (1 of 2)]
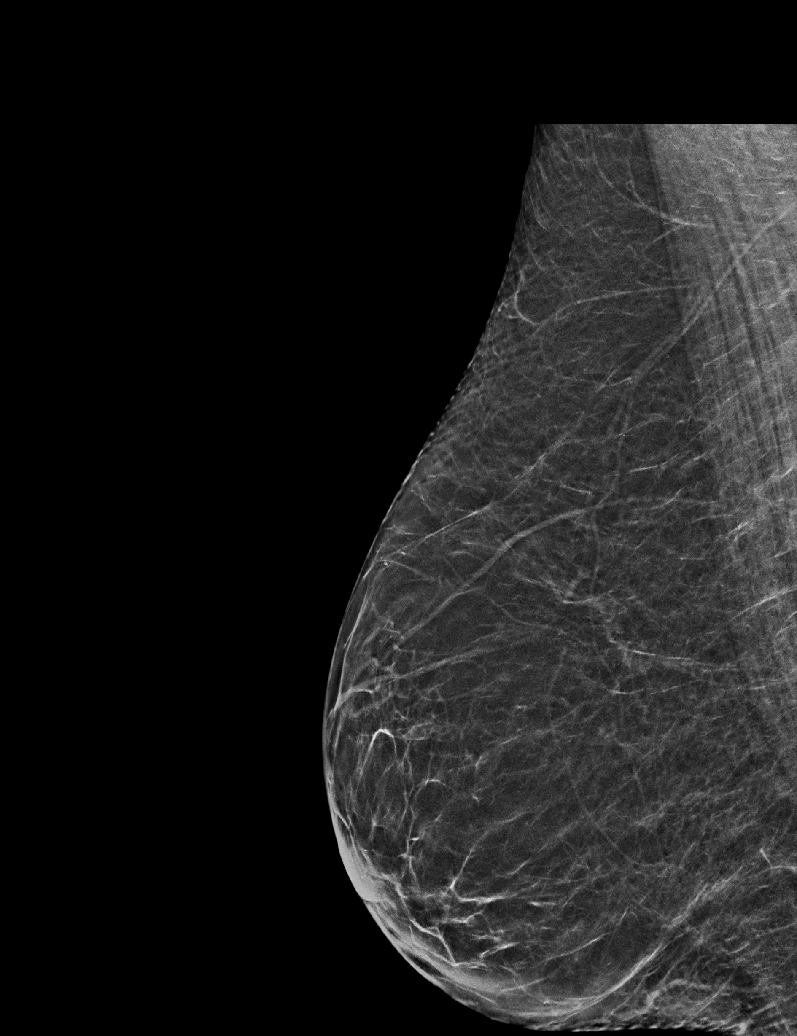

[L MLO synth-2D (1 of 2)]
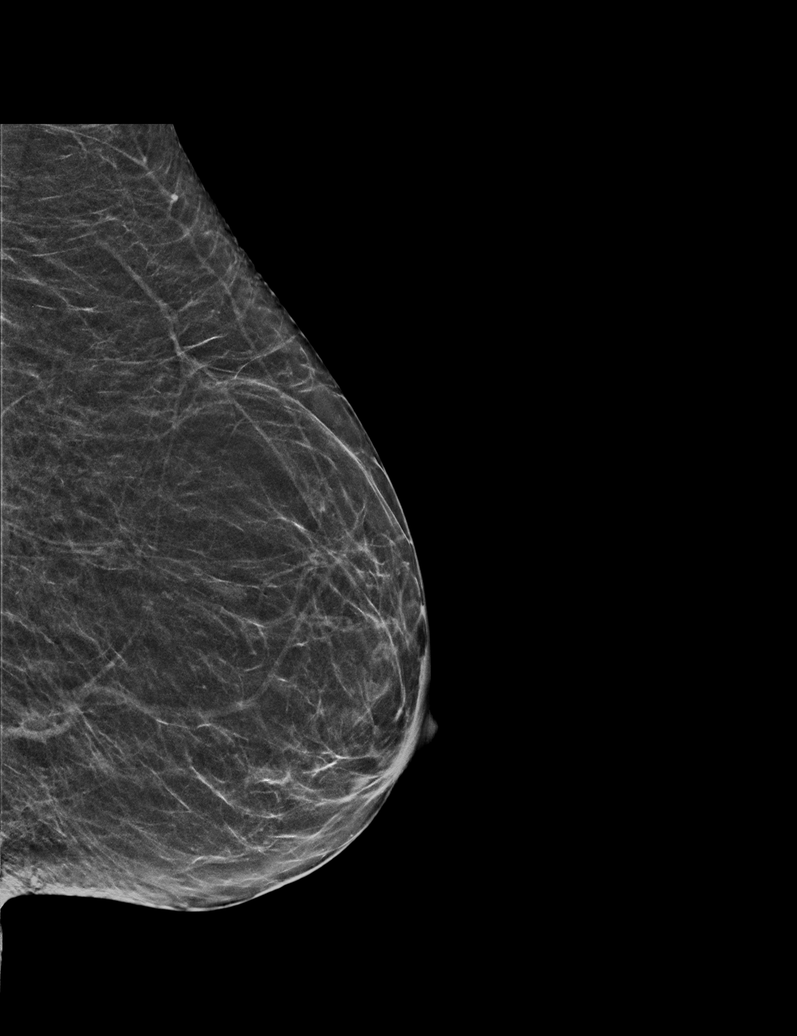

[L MLO synth-2D (2 of 2)]
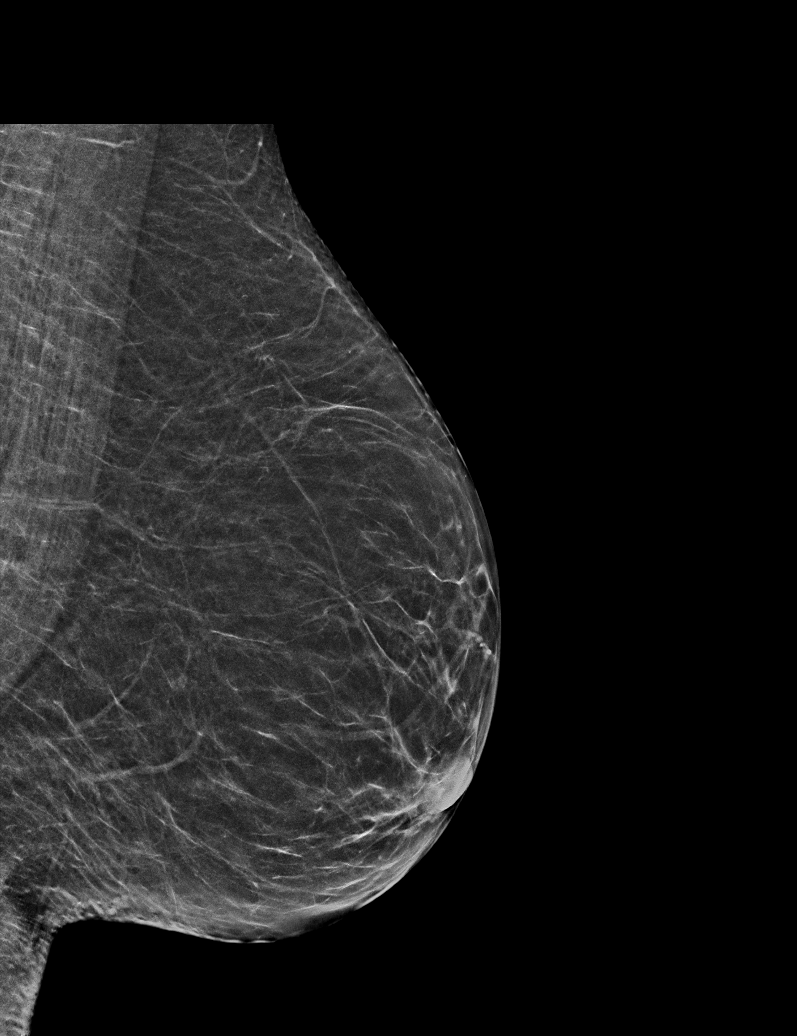

[R CC synth-2D]
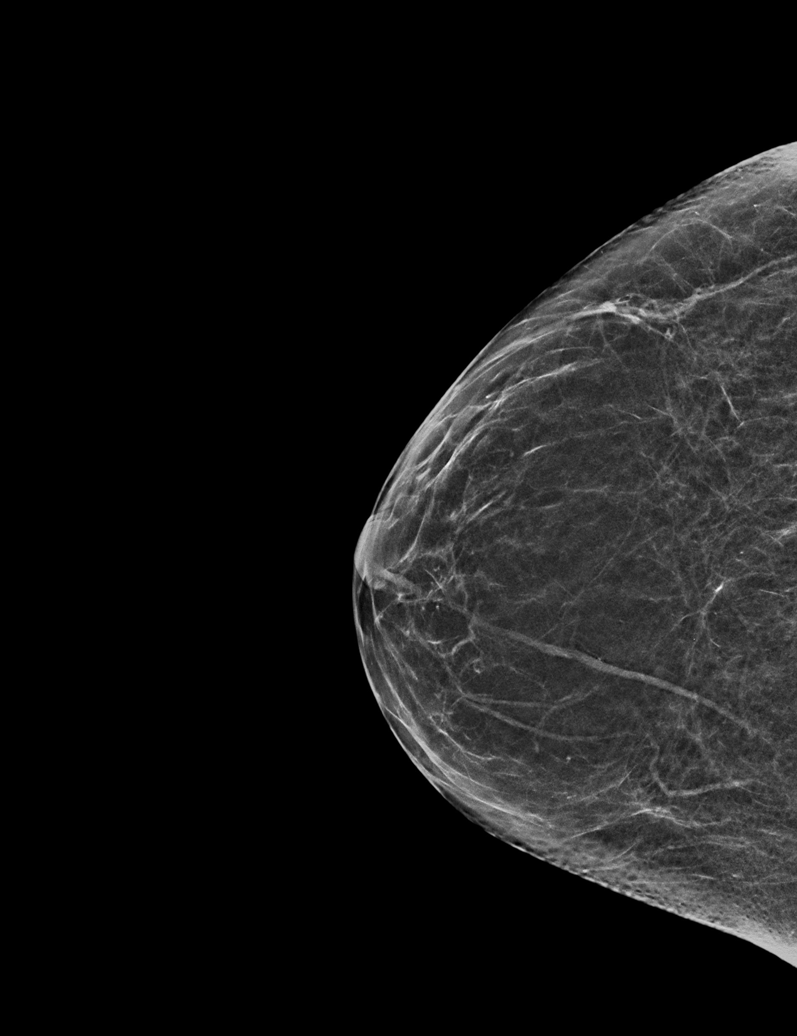

[R MLO synth-2D (2 of 2)]
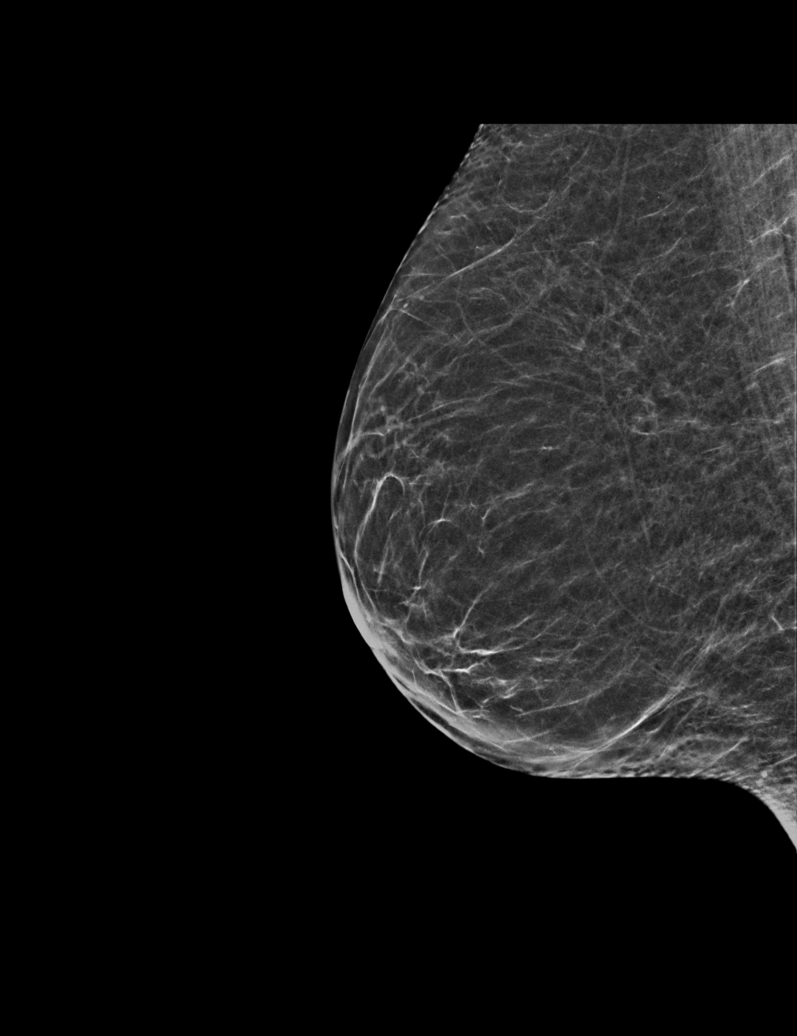

[L CC synth-2D]
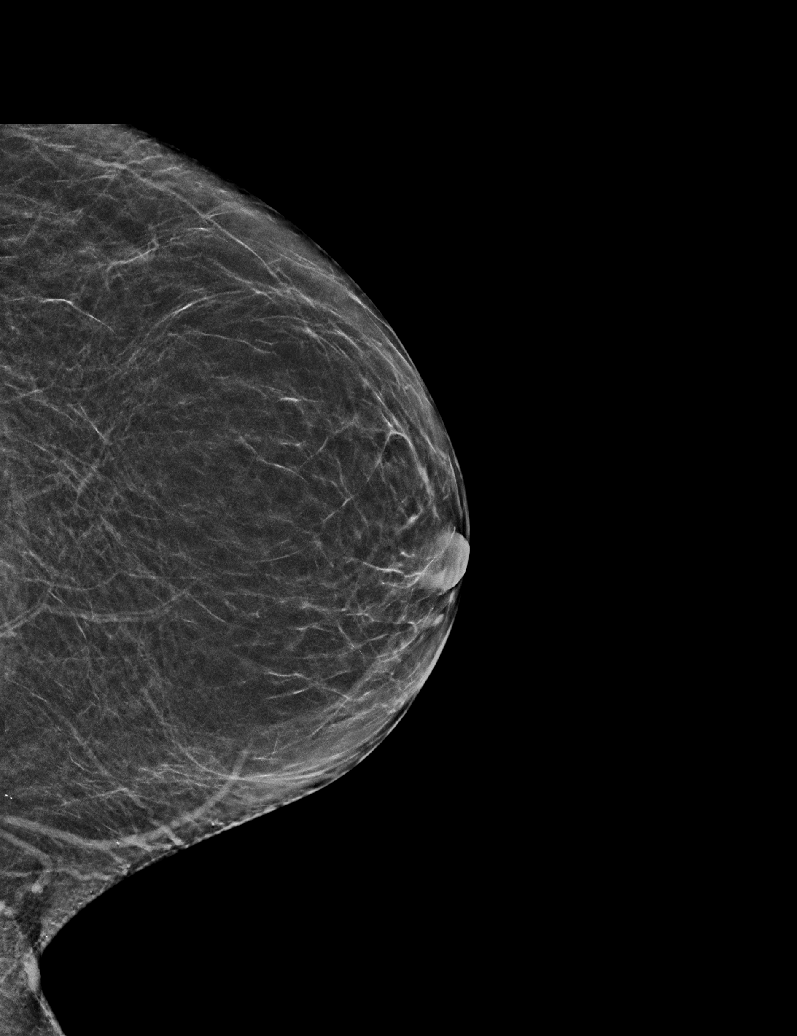

[6 of 36 positions shown; findings below may reference images not displayed]

ACR Breast Density Category b: There are scattered areas of
fibroglandular density.
FINDINGS: There are no findings suspicious for malignancy. Images were
processed with CAD.
IMPRESSION: No mammographic evidence of malignancy. A result letter of this
screening mammogram will be mailed directly to the patient.

RECOMMENDATION:
Screening mammogram in one year. (Code:Y5-G-EJ6)

BI-RADS CATEGORY  1: Negative.

## 2022-08-20 ENCOUNTER — Telehealth: Payer: Self-pay | Admitting: Internal Medicine

## 2022-08-20 NOTE — Telephone Encounter (Signed)
Lm for patient to call and schedule new patient appointment with Dr Derrel Nip, ok'd on 08/19/2022

## 2022-10-07 ENCOUNTER — Encounter: Payer: Self-pay | Admitting: Internal Medicine

## 2022-10-07 ENCOUNTER — Ambulatory Visit (INDEPENDENT_AMBULATORY_CARE_PROVIDER_SITE_OTHER): Payer: 59 | Admitting: Internal Medicine

## 2022-10-07 VITALS — BP 108/66 | HR 89 | Temp 98.4°F | Ht 64.0 in | Wt 152.6 lb

## 2022-10-07 DIAGNOSIS — D509 Iron deficiency anemia, unspecified: Secondary | ICD-10-CM

## 2022-10-07 DIAGNOSIS — F900 Attention-deficit hyperactivity disorder, predominantly inattentive type: Secondary | ICD-10-CM

## 2022-10-07 DIAGNOSIS — R768 Other specified abnormal immunological findings in serum: Secondary | ICD-10-CM

## 2022-10-07 DIAGNOSIS — M1990 Unspecified osteoarthritis, unspecified site: Secondary | ICD-10-CM | POA: Insufficient documentation

## 2022-10-07 DIAGNOSIS — Z79899 Other long term (current) drug therapy: Secondary | ICD-10-CM | POA: Diagnosis not present

## 2022-10-07 DIAGNOSIS — R5383 Other fatigue: Secondary | ICD-10-CM

## 2022-10-07 DIAGNOSIS — M2550 Pain in unspecified joint: Secondary | ICD-10-CM | POA: Diagnosis not present

## 2022-10-07 DIAGNOSIS — D72819 Decreased white blood cell count, unspecified: Secondary | ICD-10-CM

## 2022-10-07 DIAGNOSIS — K911 Postgastric surgery syndromes: Secondary | ICD-10-CM | POA: Diagnosis not present

## 2022-10-07 DIAGNOSIS — B3731 Acute candidiasis of vulva and vagina: Secondary | ICD-10-CM

## 2022-10-07 DIAGNOSIS — Z862 Personal history of diseases of the blood and blood-forming organs and certain disorders involving the immune mechanism: Secondary | ICD-10-CM

## 2022-10-07 DIAGNOSIS — K220 Achalasia of cardia: Secondary | ICD-10-CM

## 2022-10-07 DIAGNOSIS — N912 Amenorrhea, unspecified: Secondary | ICD-10-CM | POA: Diagnosis not present

## 2022-10-07 DIAGNOSIS — F32A Depression, unspecified: Secondary | ICD-10-CM

## 2022-10-07 NOTE — Assessment & Plan Note (Signed)
S/p heller myotomies,  followed by esophagectomy in 2004

## 2022-10-07 NOTE — Assessment & Plan Note (Signed)
Etiology and chronicity  unclear. Resume iron supplements for sat  15% Lab Results  Component Value Date   IRON 57 10/07/2022   TIBC 386 10/07/2022   FERRITIN 9 (L) 10/07/2022   Lab Results  Component Value Date   VITAMINB12 1,217 (H) 10/07/2022   Lab Results  Component Value Date   FOLATE >23.9 10/07/2022

## 2022-10-07 NOTE — Assessment & Plan Note (Signed)
Secondary to inadequate absorption .  Current level is excellent  Lab Results  Component Value Date   VITAMINB12 1,217 (H) 10/07/2022

## 2022-10-07 NOTE — Assessment & Plan Note (Signed)
She has been attributing her joint pain due various rheumatologic causes including  Lymes disease but has no positive serologies  except a positive ANA.  Will need rheumatology referral

## 2022-10-07 NOTE — Assessment & Plan Note (Signed)
Managed by  [facility]

## 2022-10-07 NOTE — Assessment & Plan Note (Signed)
Managed by outside physician with weekly itraconazole

## 2022-10-07 NOTE — Patient Instructions (Signed)
Welcome!  Please activate your MyChart so I can send you the results of your labs from today  Plan to return in one month   We may try using ozempic or Rybelsus (the pill form) to manage your diarrhea   [email or web address]   (This website  offers discounts for ozempic. )

## 2022-10-07 NOTE — Assessment & Plan Note (Signed)
Managed by patient preference with [facility]

## 2022-10-07 NOTE — Progress Notes (Signed)
Subjective:  Patient ID: [name] [name], female    DOB: [birth year]  Age: 42 y.o. MRN: [id number]  CC: There were no encounter diagnoses.  HPI [name] [name] presents for establishment of care . Referred by Vilinda Boehringer  Previously managed by [place] at [facility].  Has not had follow up since 2022   1)  ADD: managed by Randol Kern, MD  in [facility] with Adderall 30 mg last refill   jan 31 per database.  Now Using vitamins  and Enlight D to manage her depression for the last 2 years. Wants pyrrole level checked. .   2) achalasia diagnosed at age 33 s/p esophagectomy  in 2004 after 10 yrs of misdiagnosis and  overmedication, with oxycodone,  xanax,  lamictal,  etc Heller myotomies x 2,  NG tubes  , cholecytectomy.  Overweight as a child.    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    4) dumping syndrome: resolved 1 yr after after esophagectomy in 2014 ,    has started to recur as of 5 yrs ago .   Gets diaphoretic,  presyncopal.   Ocurring 3 times per week  for  the past several  years.  Can't gain weight Afraid to eat.  Having bile reflux .  Saw [place] ,  Edison Pace all retired now.   Looking for another gi specialist. Having 5 to 10 loose stools daily . Taking imodium. Doesn't always help.     5)  has had intermittent  left  axillary lump  (exam normal today )   7) last mammogram 2 years ago.  Every 2  years...  No FH of breast ca.  Multiple hysterectomies I n positive  HPV . Has not had menses in 3 months.  She has positive HPV. Had an ablation after 2nd dtr was born.  Was advised to have hyst but due to esophageal surgery,  high risk.   Last PAP 2 yrs ago by Kenton Kingfisher.   8)  positive ANA:  has daily pain in hand,  knees,  ankles  fingers ,  elbow   and  hands last for several hours daily ,  improved with ivermectin , but ran out Nov 2023 . And pain is worse without it. No fevers,  rigors but has occasional chills  lasting several days every 2 weeks.  Some days has no energy  [name] has a past medical history of ADHD (attention deficit hyperactivity disorder), Anxiety, Depression, and Family history of ovarian cancer.   She has a past surgical history that includes Esophagectomy; Cholecystectomy; and Abdominal surgery.   Her family history includes ADD / ADHD in her brother; Anxiety disorder in her brother, father, maternal grandmother, and mother; Ovarian cancer in her maternal aunt, maternal grandmother, and mother.She reports that she has never smoked. She has never used smokeless tobacco. She reports that she does not currently use drugs after having used the following drugs: Marijuana. She reports that she does not drink alcohol.  Outpatient Medications Prior to Visit  Medication Sig Dispense Refill   albendazole (ALBENZA) 200 MG tablet Take by mouth.     amphetamine-dextroamphetamine (ADDERALL) 10 MG tablet Take 1 tablet (10 mg total) by mouth daily after lunch. 30 tablet 0  amphetamine-dextroamphetamine (ADDERALL) 10 MG tablet Take 1 tablet (10 mg total) by mouth daily after lunch. 30 tablet 0   amphetamine-dextroamphetamine (ADDERALL) 10 MG tablet Take 1 tablet (10 mg total) by mouth daily. After lunch 30 tablet 0   amphetamine-dextroamphetamine (ADDERALL) 30 MG tablet Take 1 tablet by mouth daily. 30 tablet 0   amphetamine-dextroamphetamine (ADDERALL) 30 MG tablet Take 1 tablet by mouth daily. 30 tablet 0   amphetamine-dextroamphetamine (ADDERALL) 30 MG tablet Take 1 tablet by mouth daily. 30 tablet 0   DEXILANT 60 MG capsule Take 1 capsule by mouth daily.     erythromycin ophthalmic ointment 4 (four) times daily.     fluconazole (DIFLUCAN) 150 MG tablet PLEASE SEE ATTACHED FOR DETAILED DIRECTIONS     ivermectin (STROMECTOL) 3  MG TABS tablet Take 15 mg by mouth daily.     nitrofurantoin, macrocrystal-monohydrate, (MACROBID) 100 MG capsule Take 100 mg by mouth 2 (two) times daily.     valACYclovir (VALTREX) 1000 MG tablet Take 1,000 mg by mouth daily.     valACYclovir (VALTREX) 500 MG tablet TAKE 1 TABLET BY MOUTH EVERY DAY 90 tablet 3   hydrOXYzine (ATARAX) 10 MG tablet Take 10 mg by mouth 3 (three) times daily as needed.     No facility-administered medications prior to visit.    Review of Systems:  Patient denies headache, fevers, malaise, unintentional weight loss, skin rash, eye pain, sinus congestion and sinus pain, sore throat, dysphagia,  hemoptysis , cough, dyspnea, wheezing, chest pain, palpitations, orthopnea, edema, abdominal pain, nausea, melena, diarrhea, constipation, flank pain, dysuria, hematuria, urinary  Frequency, nocturia, numbness, tingling, seizures,  Focal weakness, Loss of consciousness,  Tremor, insomnia, depression, anxiety, and suicidal ideation.     Objective:  BP 108/66   Pulse 89   Temp 98.4 F (36.9 C) (Oral)   Ht '5\' 4"'$  (1.626 m)   Wt 152 lb 9.6 oz (69.2 kg)   SpO2 98%   BMI 26.19 kg/m   Physical Exam Vitals reviewed.  Constitutional:      General: She is not in acute distress.    Appearance: Normal appearance. She is normal weight. She is not ill-appearing, toxic-appearing or diaphoretic.  HENT:     Head: Normocephalic.  Eyes:     General: No scleral icterus.       Right eye: No discharge.        Left eye: No discharge.     Conjunctiva/sclera: Conjunctivae normal.  Cardiovascular:     Rate and Rhythm: Normal rate and regular rhythm.     Heart sounds: Normal heart sounds.  Pulmonary:     Effort: Pulmonary effort is normal. No respiratory distress.     Breath sounds: Normal breath sounds.  Musculoskeletal:        General: Normal range of motion.  Skin:    General: Skin is warm and dry.  Neurological:     General: No focal deficit present.     Mental Status: She  is alert and oriented to person, place, and time. Mental status is at baseline.  Psychiatric:        Mood and Affect: Mood normal.        Behavior: Behavior normal.        Thought Content: Thought content normal.        Judgment: Judgment normal.   Assessment & Plan:  There are no diagnoses linked to this encounter.   Follow-up: No follow-ups on file.   I provided 30 minutes during this  encounter reviewing patient's last visit with previous provider,  most recent imaging studies and labs.  Provided counseling on the above mentioned problems and coordination of care.   Crecencio Mc, MD

## 2022-10-08 LAB — B12 AND FOLATE PANEL
Folate: >23.9 ng/mL (ref >5.9)
Vitamin B-12: 1217 pg/mL — ABNORMAL HIGH (ref 211–911)

## 2022-10-08 LAB — CBC WITH DIFFERENTIAL/PLATELET
Basophils Absolute: 0 10*3/uL (ref 0.0–0.1)
Basophils Relative: 0.8 % (ref 0.0–3.0)
Eosinophils Absolute: 0 10*3/uL (ref 0.0–0.7)
Eosinophils Relative: 0.5 % (ref 0.0–5.0)
HCT: 32.9 % — ABNORMAL LOW (ref 36.0–46.0)
Hemoglobin: 11.8 g/dL — ABNORMAL LOW (ref 12.0–15.0)
Lymphocytes Relative: 31.1 % (ref 12.0–46.0)
Lymphs Abs: 1.4 10*3/uL (ref 0.7–4.0)
MCHC: 35.7 g/dL (ref 30.0–36.0)
MCV: 99.6 fl (ref 78.0–100.0)
Monocytes Absolute: 0.2 10*3/uL (ref 0.1–1.0)
Monocytes Relative: 5.5 % (ref 3.0–12.0)
Neutro Abs: 2.8 10*3/uL (ref 1.4–7.7)
Neutrophils Relative %: 62.1 % (ref 43.0–77.0)
Platelets: 226 10*3/uL (ref 150.0–400.0)
RBC: 3.31 Mil/uL — ABNORMAL LOW (ref 3.87–5.11)
RDW: 13.7 % (ref 11.5–15.5)
WBC: 4.5 10*3/uL (ref 4.0–10.5)

## 2022-10-08 LAB — COMPREHENSIVE METABOLIC PANEL
ALT: 13 U/L (ref 0–35)
AST: 18 U/L (ref 0–37)
Albumin: 4.2 g/dL (ref 3.5–5.2)
Alkaline Phosphatase: 76 U/L (ref 39–117)
BUN: 13 mg/dL (ref 6–23)
CO2: 27 mEq/L (ref 19–32)
Calcium: 9.5 mg/dL (ref 8.4–10.5)
Chloride: 104 mEq/L (ref 96–112)
Creatinine, Ser: 0.74 mg/dL (ref 0.40–1.20)
GFR: 98.59 mL/min (ref >60.00)
Glucose, Bld: 95 mg/dL (ref 70–99)
Potassium: 4 mEq/L (ref 3.5–5.1)
Sodium: 139 mEq/L (ref 135–145)
Total Bilirubin: 0.5 mg/dL (ref 0.2–1.2)
Total Protein: 6.7 g/dL (ref 6.0–8.3)

## 2022-10-08 LAB — LIPID PANEL
Cholesterol: 153 mg/dL (ref 0–200)
HDL: 55.2 mg/dL (ref >39.00)
LDL Cholesterol: 86 mg/dL (ref 0–99)
NonHDL: 97.66
Total CHOL/HDL Ratio: 3
Triglycerides: 56 mg/dL (ref 0.0–149.0)
VLDL: 11.2 mg/dL (ref 0.0–40.0)

## 2022-10-08 LAB — VITAMIN D 25 HYDROXY (VIT D DEFICIENCY, FRACTURES): VITD: 58.77 ng/mL (ref 30.00–100.00)

## 2022-10-08 LAB — TSH: TSH: 2.59 u[IU]/mL (ref 0.35–5.50)

## 2022-10-08 LAB — SEDIMENTATION RATE: Sed Rate: 6 mm/hr (ref 0–20)

## 2022-10-08 LAB — C-REACTIVE PROTEIN: CRP: <1 mg/dL (ref 0.5–20.0)

## 2022-10-08 LAB — FOLLICLE STIMULATING HORMONE: FSH: 24.8 m[IU]/mL

## 2022-10-08 LAB — LUTEINIZING HORMONE: LH: 28.15 m[IU]/mL

## 2022-10-09 MED ORDER — IRON (FERROUS SULFATE) 325 (65 FE) MG PO TABS: 325.0000 mg | ORAL_TABLET | Freq: Every day | ORAL | 2 refills | Status: DC

## 2022-10-09 NOTE — Addendum Note (Signed)
Addended by: Crecencio Mc on: 10/09/2022 01:27 PM   Modules accepted: Orders

## 2022-10-09 NOTE — Assessment & Plan Note (Signed)
Currently her hgb is near normal but iron sat is <15%.  Recommending supplementation

## 2022-10-09 NOTE — Assessment & Plan Note (Signed)
Resolved by current CBC  Lab Results  Component Value Date   WBC 4.5 10/07/2022   HGB 11.8 (L) 10/07/2022   HCT 32.9 (L) 10/07/2022   MCV 99.6 10/07/2022   PLT 226.0 10/07/2022

## 2022-10-11 LAB — ANTI-NUCLEAR AB-TITER (ANA TITER): ANA Titer 1: 1:40 {titer} — ABNORMAL HIGH

## 2022-10-11 LAB — IRON,TIBC AND FERRITIN PANEL
%SAT: 15 % (calc) — ABNORMAL LOW (ref 16–45)
Ferritin: 9 ng/mL — ABNORMAL LOW (ref 16–232)
Iron: 57 ug/dL (ref 40–190)
TIBC: 386 mcg/dL (calc) (ref 250–450)

## 2022-10-11 LAB — VITAMIN E
Gamma-Tocopherol (Vit E): 1.8 mg/L (ref <4.4)
Vitamin E (Alpha Tocopherol): 8.1 mg/L (ref 5.7–19.9)

## 2022-10-11 LAB — VITAMIN A: Vitamin A (Retinoic Acid): 30 ug/dL — ABNORMAL LOW (ref 38–98)

## 2022-10-11 LAB — VITAMIN K1, SERUM: Vitamin K: 471 pg/mL (ref 130–1500)

## 2022-10-11 LAB — ANA: Anti Nuclear Antibody (ANA): POSITIVE — AB

## 2022-11-05 ENCOUNTER — Ambulatory Visit: Payer: 59 | Admitting: Internal Medicine

## 2022-11-13 ENCOUNTER — Telehealth: Payer: Self-pay | Admitting: Internal Medicine

## 2022-11-13 NOTE — Telephone Encounter (Signed)
Called patient to schedule Medicare Annual Wellness Visit (AWV). Left message for patient to call back and schedule Medicare Annual Wellness Visit (AWV).  Last date of AWV:  AWVI eligible as of  12/09/2021   Please schedule an AWVI appointment at any time with  [place].  If any questions, please contact me at [phone].    Thank you,  [place] Direct dial  [phone]

## 2023-03-23 ENCOUNTER — Encounter: Payer: Self-pay | Admitting: Internal Medicine

## 2023-05-13 ENCOUNTER — Ambulatory Visit: Payer: 59 | Admitting: Internal Medicine

## 2023-05-13 ENCOUNTER — Encounter: Payer: Self-pay | Admitting: Internal Medicine

## 2023-05-13 VITALS — BP 98/64 | HR 59 | Temp 98.1°F | Ht 64.0 in | Wt 152.0 lb

## 2023-05-13 DIAGNOSIS — M6281 Muscle weakness (generalized): Secondary | ICD-10-CM | POA: Diagnosis not present

## 2023-05-13 DIAGNOSIS — K220 Achalasia of cardia: Secondary | ICD-10-CM

## 2023-05-13 DIAGNOSIS — E785 Hyperlipidemia, unspecified: Secondary | ICD-10-CM

## 2023-05-13 DIAGNOSIS — D649 Anemia, unspecified: Secondary | ICD-10-CM | POA: Diagnosis not present

## 2023-05-13 DIAGNOSIS — Z79899 Other long term (current) drug therapy: Secondary | ICD-10-CM

## 2023-05-13 DIAGNOSIS — A6004 Herpesviral vulvovaginitis: Secondary | ICD-10-CM

## 2023-05-13 DIAGNOSIS — M1990 Unspecified osteoarthritis, unspecified site: Secondary | ICD-10-CM

## 2023-05-13 DIAGNOSIS — Z8619 Personal history of other infectious and parasitic diseases: Secondary | ICD-10-CM

## 2023-05-13 DIAGNOSIS — K3184 Gastroparesis: Secondary | ICD-10-CM

## 2023-05-13 MED ORDER — VALACYCLOVIR HCL 1 G PO TABS: 1000.0000 mg | ORAL_TABLET | Freq: Every day | ORAL | 3 refills | Status: DC

## 2023-05-13 MED ORDER — CHOLESTYRAMINE 4 GM/DOSE PO POWD: 2.0000 g | Freq: Every day | ORAL | 5 refills | Status: DC

## 2023-05-13 MED ORDER — DEXILANT 60 MG PO CPDR: 1.0000 | DELAYED_RELEASE_CAPSULE | Freq: Every day | ORAL | 3 refills | Status: DC

## 2023-05-13 MED ORDER — CELECOXIB 400 MG PO CAPS: 400.0000 mg | ORAL_CAPSULE | Freq: Every day | ORAL | 0 refills | Status: DC

## 2023-05-13 NOTE — Patient Instructions (Signed)
Trial of celebrex once daily instead of Motrin  EMG/Nerve conduction studies needed to rule out nerve damage  [facility] referral made to facilitate workup  Cholestyramine and valacyclovir refilled for once daily use   Referral to [facility] for achalasia management

## 2023-05-13 NOTE — Progress Notes (Unsigned)
Subjective:  Patient ID: [name] [name], female    DOB: [birth year]  Age: 42 y.o. MRN: [id number]  CC: The primary encounter diagnosis was Anemia, unspecified type. Diagnoses of Long-term use of high-risk medication, Dyslipidemia, Muscle weakness of left arm, Nondiabetic gastroparesis, ACHALASIA, Arthritis, History of Lyme disease, and Herpes simplex vulvovaginitis were also pertinent to this visit.   HPI [name] [name] presents for  Chief Complaint  Patient presents with   Medical Management of Chronic Issues   1) ADD;  managed by  Dr.  Elna Breslow with adderall  2) left arm pain :  occurred after trying to lift a heavy bag from an abducted/externally rotated  position, occurred 10 days ago,  she felt  immediate burning from A/c joint to the left hand,  the pain worsened over the next several days .  Tried using motrin . No prior injury to left shoulder , pain is constant and left hand has developed weakness.  3)  recurrent HSV outbreaks  on 500 m g suppressive doses of valacylovir   4)   chronic dumping syndrome,  bile vomiting   improved with cholestyramine trial In the past,  wants to resume   5) still needs a rheumatology referral .for arthritis with reported history of Lymes Disease previously managed with ivermectin .  6) Achalasia:  remote esophagectomy   no longer followed by an  achalasia specialist because her previous one retired.  Surgery was by Marshell Levan Previous specialists have retired from [employer] willing to go [facility]    Outpatient Medications Prior to Visit  Medication Sig Dispense Refill   amphetamine-dextroamphetamine (ADDERALL) 30 MG tablet Take 1 tablet by mouth daily. 30 tablet 0   amphetamine-dextroamphetamine (ADDERALL) 30 MG tablet Take 1 tablet by mouth daily. 30 tablet 0   amphetamine-dextroamphetamine (ADDERALL) 30 MG tablet Take 1 tablet by mouth daily. 30 tablet 0   b complex vitamins capsule Take 1 capsule by mouth daily.     fluconazole (DIFLUCAN)  200 MG tablet Take 200 mg by mouth 2 (two) times a week.     hydrOXYzine (ATARAX) 10 MG tablet Take 10 mg by mouth 3 (three) times daily as needed.     MAGNESIUM PO Take 150 mg by mouth daily.     Omega-3 Fatty Acids (FISH OIL) 300 MG CAPS Take 1 capsule by mouth daily.     valACYclovir (VALTREX) 1000 MG tablet Take 1 tablet (1,000 mg total) by mouth daily. 30 tablet 11   Zinc 30 MG TABS Take 1 tablet by mouth daily.     DEXILANT 60 MG capsule Take 1 capsule by mouth daily.     valACYclovir (VALTREX) 1000 MG tablet Take 1,000 mg by mouth daily.     albendazole (ALBENZA) 200 MG tablet Take by mouth. (Patient not taking: Reported on 05/13/2023)     amphetamine-dextroamphetamine (ADDERALL) 10 MG tablet Take 1 tablet (10 mg total) by mouth daily after lunch. (Patient not taking: Reported on 05/13/2023) 30 tablet 0   amphetamine-dextroamphetamine (ADDERALL) 10 MG tablet Take 1 tablet (10 mg total) by mouth daily after lunch. (Patient not taking: Reported on 05/13/2023) 30 tablet 0   amphetamine-dextroamphetamine (ADDERALL) 10 MG tablet Take 1 tablet (10 mg total) by mouth daily. After lunch (Patient not taking: Reported on 05/13/2023) 30 tablet 0   erythromycin ophthalmic ointment 4 (four) times daily. (Patient not taking: Reported on 05/13/2023)     fluconazole (DIFLUCAN) 150 MG tablet PLEASE SEE ATTACHED FOR DETAILED DIRECTIONS (  Patient not taking: Reported on 05/13/2023)     Iron, Ferrous Sulfate, 325 (65 Fe) MG TABS Take 325 mg by mouth daily. (Patient not taking: Reported on 05/13/2023) 30 tablet 2   itraconazole (SPORANOX) 100 MG capsule Take 100 mg by mouth daily. (Patient not taking: Reported on 05/13/2023)     ivermectin (STROMECTOL) 3 MG TABS tablet Take 15 mg by mouth daily. (Patient not taking: Reported on 05/13/2023)     nitrofurantoin, macrocrystal-monohydrate, (MACROBID) 100 MG capsule Take 100 mg by mouth 2 (two) times daily. (Patient not taking: Reported on 05/13/2023)     valACYclovir (VALTREX)  500 MG tablet TAKE 1 TABLET BY MOUTH EVERY DAY (Patient not taking: Reported on 05/13/2023) 90 tablet 3   No facility-administered medications prior to visit.    Review of Systems;  Patient denies headache, fevers, malaise, unintentional weight loss, skin rash, eye pain, sinus congestion and sinus pain, sore throat, dysphagia,  hemoptysis , cough, dyspnea, wheezing, chest pain, palpitations, orthopnea, edema, abdominal pain, nausea, melena, diarrhea, constipation, flank pain, dysuria, hematuria, urinary  Frequency, nocturia, numbness, tingling, seizures,  Focal weakness, Loss of consciousness,  Tremor, insomnia, depression, anxiety, and suicidal ideation.      Objective:  BP 98/64   Pulse (!) 59   Temp 98.1 F (36.7 C) (Oral)   Ht 5\' 4"  (1.626 m)   Wt 152 lb (68.9 kg)   SpO2 99%   BMI 26.09 kg/m   BP Readings from Last 3 Encounters:  05/13/23 98/64  10/07/22 108/66  08/01/19 100/70    Wt Readings from Last 3 Encounters:  05/13/23 152 lb (68.9 kg)  10/07/22 152 lb 9.6 oz (69.2 kg)  08/01/19 155 lb (70.3 kg)    Physical Exam  No results found for: "HGBA1C"  Lab Results  Component Value Date   CREATININE 0.74 10/07/2022   CREATININE 0.62 09/25/2016   CREATININE 0.57 10/02/2010    Lab Results  Component Value Date   WBC 4.5 10/07/2022   HGB 11.8 (L) 10/07/2022   HCT 32.9 (L) 10/07/2022   PLT 226.0 10/07/2022   GLUCOSE 95 10/07/2022   CHOL 153 10/07/2022   TRIG 56.0 10/07/2022   HDL 55.20 10/07/2022   LDLCALC 86 10/07/2022   ALT 13 10/07/2022   AST 18 10/07/2022   NA 139 10/07/2022   K 4.0 10/07/2022   CL 104 10/07/2022   CREATININE 0.74 10/07/2022   BUN 13 10/07/2022   CO2 27 10/07/2022   TSH 2.59 10/07/2022   INR 1.11 12/28/2009    MM 3D SCREEN BREAST BILATERAL  Result Date: 10/21/2019 CLINICAL DATA:  Screening. EXAM: DIGITAL SCREENING BILATERAL MAMMOGRAM WITH TOMO AND CAD COMPARISON:  None. ACR Breast Density Category b: There are scattered areas of  fibroglandular density. FINDINGS: There are no findings suspicious for malignancy. Images were processed with CAD. IMPRESSION: No mammographic evidence of malignancy. A result letter of this screening mammogram will be mailed directly to the patient. RECOMMENDATION: Screening mammogram in one year. (Code:SM-B-01Y) BI-RADS CATEGORY  1: Negative. Electronically Signed   By: Norva Pavlov M.D.   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 EMG/nerve conduction studies and neurology referral made.   Orders: -     NCV with EMG(electromyography) -     Ambulatory referral to Orthopedic Surgery -     NCV with EMG(electromyography); Future -     Ambulatory referral to Neurology  Nondiabetic gastroparesis Assessment & Plan: Previously aggravated by chronic opiate use (per [facility] 2017 GI note).  Weight has been stable but she has recurrent episodes of bilious vomiting .  Resume cholestyramine Complicated by prior surgeries for achalasia (Heller myotomy in 2012, Iver-Lewis esophagectomy in April 2013)    ACHALASIA Assessment & Plan: Treated since 2012 ,  procedures include a (Heller myotomy in 2012, Iver-Lewis esophagectomy in April 2013) pylorplasty and J tbe in 2013,  esophago-gastrectomy Sept 2013.  She currently has no achalasia specialist.   Arthritis Assessment & Plan: She has been attributing her joint pain due various rheumatologic causes including  Lymes disease but has no positive serologies  except a positive ANA.  Will need rheumatology referral   Orders: -     Ambulatory referral to Rheumatology  History of  Lyme disease -     Ambulatory referral to Rheumatology  Herpes simplex vulvovaginitis Assessment & Plan: Increase suppressive dose of valacylovir to 1000 mg daily    Other orders -     Celecoxib; Take 1 capsule (400 mg total) by mouth daily after breakfast.  Dispense: 30 capsule; Refill: 0 -     valACYclovir HCl; Take 1 tablet (1,000 mg total) by mouth daily.  Dispense: 90 tablet; Refill: 3 -     Dexilant; Take 1 capsule (60 mg total) by mouth daily.  Dispense: 90 capsule; Refill: 3 -     Cholestyramine; Take 0.5 packets (2 g total) by mouth daily.  Dispense: 378 g; Refill: 5     I provided 42 minutes of face-to-face time during this encounter reviewing patient's last visit with me, patient's  most recent visit with  [facility].  Prior  surgical and non surgical procedures, previous  labs and imaging studies, counseling on currently addressed issues,  and post visit ordering to diagnostics and therapeutics .   Follow-up: No follow-ups on file.   Sherlene Shams, MD

## 2023-05-14 ENCOUNTER — Other Ambulatory Visit: Payer: Self-pay | Admitting: Internal Medicine

## 2023-05-14 DIAGNOSIS — K220 Achalasia of cardia: Secondary | ICD-10-CM

## 2023-05-14 DIAGNOSIS — M6281 Muscle weakness (generalized): Secondary | ICD-10-CM | POA: Insufficient documentation

## 2023-05-14 DIAGNOSIS — R202 Paresthesia of skin: Secondary | ICD-10-CM | POA: Insufficient documentation

## 2023-05-14 DIAGNOSIS — K3184 Gastroparesis: Secondary | ICD-10-CM | POA: Insufficient documentation

## 2023-05-14 LAB — CBC WITH DIFFERENTIAL/PLATELET
Basophils Absolute: 0 10*3/uL (ref 0.0–0.1)
Basophils Relative: 0.8 % (ref 0.0–3.0)
Eosinophils Absolute: 0 10*3/uL (ref 0.0–0.7)
Eosinophils Relative: 0.4 % (ref 0.0–5.0)
HCT: 31.2 % — ABNORMAL LOW (ref 36.0–46.0)
Hemoglobin: 10.8 g/dL — ABNORMAL LOW (ref 12.0–15.0)
Lymphocytes Relative: 31.9 % (ref 12.0–46.0)
Lymphs Abs: 1.3 10*3/uL (ref 0.7–4.0)
MCHC: 34.6 g/dL (ref 30.0–36.0)
MCV: 100.2 fL — ABNORMAL HIGH (ref 78.0–100.0)
Monocytes Absolute: 0.3 10*3/uL (ref 0.1–1.0)
Monocytes Relative: 6.4 % (ref 3.0–12.0)
Neutro Abs: 2.5 10*3/uL (ref 1.4–7.7)
Neutrophils Relative %: 60.5 % (ref 43.0–77.0)
Platelets: 192 10*3/uL (ref 150.0–400.0)
RBC: 3.11 Mil/uL — ABNORMAL LOW (ref 3.87–5.11)
RDW: 14.2 % (ref 11.5–15.5)
WBC: 4.2 10*3/uL (ref 4.0–10.5)

## 2023-05-14 LAB — COMPREHENSIVE METABOLIC PANEL
ALT: 10 U/L (ref 0–35)
AST: 17 U/L (ref 0–37)
Albumin: 4.2 g/dL (ref 3.5–5.2)
Alkaline Phosphatase: 58 U/L (ref 39–117)
BUN: 16 mg/dL (ref 6–23)
CO2: 28 meq/L (ref 19–32)
Calcium: 9.1 mg/dL (ref 8.4–10.5)
Chloride: 105 meq/L (ref 96–112)
Creatinine, Ser: 0.68 mg/dL (ref 0.40–1.20)
GFR: 105.68 mL/min (ref >60.00)
Glucose, Bld: 89 mg/dL (ref 70–99)
Potassium: 4 meq/L (ref 3.5–5.1)
Sodium: 139 meq/L (ref 135–145)
Total Bilirubin: 0.5 mg/dL (ref 0.2–1.2)
Total Protein: 6.7 g/dL (ref 6.0–8.3)

## 2023-05-14 LAB — LDL CHOLESTEROL, DIRECT: Direct LDL: 75 mg/dL

## 2023-05-14 LAB — LIPID PANEL
Cholesterol: 142 mg/dL (ref 0–200)
HDL: 62.3 mg/dL (ref >39.00)
LDL Cholesterol: 69 mg/dL (ref 0–99)
NonHDL: 80.16
Total CHOL/HDL Ratio: 2
Triglycerides: 55 mg/dL (ref 0.0–149.0)
VLDL: 11 mg/dL (ref 0.0–40.0)

## 2023-05-14 LAB — TSH: TSH: 2.5 u[IU]/mL (ref 0.35–5.50)

## 2023-05-14 LAB — HEMOGLOBIN A1C: Hgb A1c MFr Bld: 4.6 % (ref 4.6–6.5)

## 2023-05-14 NOTE — Assessment & Plan Note (Signed)
She has been attributing her joint pain due various rheumatologic causes including  Lymes disease but has no positive serologies  except a positive ANA.  Will need rheumatology referral

## 2023-05-14 NOTE — Addendum Note (Signed)
Addended by: Sherlene Shams on: 05/14/2023 10:36 PM   Modules accepted: Orders

## 2023-05-14 NOTE — Assessment & Plan Note (Signed)
Treated since 2012 ,  procedures include a (Heller myotomy in 2012, Iver-Lewis esophagectomy in April 2013) pylorplasty and J tbe in 2013,  esophago-gastrectomy Sept 2013.  She currently has no achalasia specialist.

## 2023-05-14 NOTE — Assessment & Plan Note (Signed)
Increase suppressive dose of valacylovir to 1000 mg daily

## 2023-05-14 NOTE — Assessment & Plan Note (Signed)
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 EMG/nerve conduction studies and neurology referral made.

## 2023-05-14 NOTE — Assessment & Plan Note (Signed)
Previously aggravated by chronic opiate use (per [facility] 2017 GI note).  Weight has been stable but she has recurrent episodes of bilious vomiting .  Resume cholestyramine Complicated by prior surgeries for achalasia (Heller myotomy in 2012, Iver-Lewis esophagectomy in April 2013)

## 2023-05-15 NOTE — Progress Notes (Deleted)
Initial neurology clinic note  Reason for Evaluation: Consultation requested by Sherlene Shams, MD for an opinion regarding left arm pain and weakness. My final recommendations will be communicated back to the requesting physician by way of shared medical record or letter to requesting physician via [place] mail.  HPI: This is Ms. [name], a 42 y.o. ***-handed female with a medical history of ADD, recurrent HSV, chronic dumping syndrome, arthritis, achalasia, iron deficiency anemia*** who presents to neurology clinic with the chief complaint of left arm pain and weakness. The patient is accompanied by ***.  *** Left arm pain Occurred after trying to lift heavy bag in late 04/2023 Felt immediate burning from a/c joint to left hand Pain worsened over the next several days Now developing left hand weakness Per PCP, exam is concerning for brachial plexus injury - would like EMG.  On B complex and Zinc***  The patient has not*** had similar episodes of symptoms in the past. ***  Muscle bulk loss? *** Muscle pain? ***  Cramps/Twitching? *** Suggestion of myotonia/difficulty relaxing after contraction? ***  Fatigable weakness?*** Does strength improve after brief exercise?***  Able to brush hair/teeth without difficulty? *** Able to button shirts/use zips? *** Clumsiness/dropping grasped objects?*** Can you arise from squatted position easily? *** Able to get out of chair without using arms? *** Able to walk up steps easily? *** Use an assistive device to walk? *** Significant imbalance with walking? *** Falls?*** Any change in urine color, especially after exertion/physical activity? ***  The patient denies*** symptoms suggestive of oculobulbar weakness including diplopia, ptosis, dysphagia, poor saliva control, dysarthria/dysphonia, impaired mastication, facial weakness/droop.  There are no*** neuromuscular respiratory weakness symptoms, particularly orthopnea>dyspnea.    Pseudobulbar affect is absent***.  The patient does not*** report symptoms referable to autonomic dysfunction including impaired sweating, heat or cold intolerance, excessive mucosal dryness, gastroparetic early satiety, postprandial abdominal bloating, constipation, bowel or bladder dyscontrol, erectile dysfunction*** or syncope/presyncope/orthostatic intolerance.  There are no*** complaints relating to other symptoms of small fiber modalities including paresthesia/pain.  The patient has not *** noticed any recent skin rashes nor does he*** report any constitutional symptoms like fever, night sweats, anorexia or unintentional weight loss.  EtOH use: ***  Restrictive diet? *** Family history of neuropathy/myopathy/NM disease?***  Previous labs, electrodiagnostics, and neuroimaging are summarized below, but pertinent findings include***  Any biopsy done? *** Current medications being tried for the patient's symptoms include ***  Prior medications that have been tried: ***   MEDICATIONS:  Outpatient Encounter Medications as of 05/19/2023  Medication Sig   amphetamine-dextroamphetamine (ADDERALL) 30 MG tablet Take 1 tablet by mouth daily.   amphetamine-dextroamphetamine (ADDERALL) 30 MG tablet Take 1 tablet by mouth daily.   amphetamine-dextroamphetamine (ADDERALL) 30 MG tablet Take 1 tablet by mouth daily.   b complex vitamins capsule Take 1 capsule by mouth daily.   celecoxib (CELEBREX) 400 MG capsule Take 1 capsule (400 mg total) by mouth daily after breakfast.   cholestyramine (QUESTRAN) 4 GM/DOSE powder Take 0.5 packets (2 g total) by mouth daily.   DEXILANT 60 MG capsule Take 1 capsule (60 mg total) by mouth daily.   fluconazole (DIFLUCAN) 200 MG tablet Take 200 mg by mouth 2 (two) times a week.   hydrOXYzine (ATARAX) 10 MG tablet Take 10 mg by mouth 3 (three) times daily as needed.   MAGNESIUM PO Take 150 mg by mouth daily.   Omega-3 Fatty Acids (FISH OIL) 300 MG CAPS Take 1  capsule by  mouth daily.   valACYclovir (VALTREX) 1000 MG tablet Take 1 tablet (1,000 mg total) by mouth daily.   valACYclovir (VALTREX) 1000 MG tablet Take 1 tablet (1,000 mg total) by mouth daily.   valACYclovir (VALTREX) 1000 MG tablet Take 1 tablet (1,000 mg total) by mouth daily.   Zinc 30 MG TABS Take 1 tablet by mouth daily.   No facility-administered encounter medications on file as of 05/19/2023.    PAST MEDICAL HISTORY: Past Medical History:  Diagnosis Date   ADHD (attention deficit hyperactivity disorder)    Anxiety    Depression    Family history of ovarian cancer    12/20 doesn't meet medicare genetic testing guidelines   Migraine     PAST SURGICAL HISTORY: Past Surgical History:  Procedure Laterality Date   ABDOMINAL SURGERY     CHOLECYSTECTOMY     ESOPHAGECTOMY      ALLERGIES: Allergies  Allergen Reactions   Fentanyl Hives   Metoclopramide Other (See Comments)   Morphine Hives   Sugar-Protein-Starch Other (See Comments)    Dumping syndrome   Butalbital-Apap-Caffeine Other (See Comments)    Paranoia   Diazepam Other (See Comments)   Penicillins    Promethazine Other (See Comments)    seizures   Latex Rash   Lorazepam Other (See Comments)    combative    FAMILY HISTORY: Family History  Problem Relation Age of Onset   Hypertension Mother    Hyperlipidemia Mother    Heart disease Mother    Diabetes Mother    Arthritis Mother    Anxiety disorder Mother    Ovarian cancer Mother    Hypertension Father    Hyperlipidemia Father    Heart disease Father    Diabetes Father    Cancer Father    Arthritis Father    Anxiety disorder Father    ADD / ADHD Brother    Anxiety disorder Brother    Ovarian cancer Maternal Aunt    Hypertension Maternal Grandmother    Hyperlipidemia Maternal Grandmother    Heart disease Maternal Grandmother    Hearing loss Maternal Grandmother    Early death Maternal Grandmother    Diabetes Maternal Grandmother    COPD  Maternal Grandmother    Asthma Maternal Grandmother    Arthritis Maternal Grandmother    Anxiety disorder Maternal Grandmother    Ovarian cancer Maternal Grandmother    Hearing loss Maternal Grandfather    Diabetes Maternal Grandfather    COPD Maternal Grandfather     SOCIAL HISTORY: Social History   Tobacco Use   Smoking status: Never   Smokeless tobacco: Never  Vaping Use   Vaping status: Never Used  Substance Use Topics   Alcohol use: No   Drug use: Not Currently    Types: Marijuana    Comment: last used 6 mths ago   Social History   Social History Narrative   Not on file     OBJECTIVE: PHYSICAL EXAM: There were no vitals taken for this visit.  General:*** General appearance: Awake and alert. No distress. Cooperative with exam.  Skin: No obvious rash or jaundice. HEENT: Atraumatic. Anicteric. Lungs: Non-labored breathing on room air  Heart: Regular Abdomen: Soft, non tender. Extremities: No edema. No obvious deformity.  Musculoskeletal: No obvious joint swelling. Psych: Affect appropriate.  Neurological: Mental Status: Alert. Speech fluent. No pseudobulbar affect Cranial Nerves: CNII: No RAPD. Visual fields grossly intact. CNIII, IV, VI: PERRL. No nystagmus. EOMI. CN V: Facial sensation intact bilaterally to fine touch.  Masseter clench strong. Jaw jerk***. CN VII: Facial muscles symmetric and strong. No ptosis at rest or after sustained upgaze***. CN VIII: Hearing grossly intact bilaterally. CN IX: No hypophonia. CN X: Palate elevates symmetrically. CN XI: Full strength shoulder shrug bilaterally. CN XII: Tongue protrusion full and midline. No atrophy or fasciculations. No significant dysarthria*** Motor: Tone is ***. *** fasciculations in *** extremities. *** atrophy. No grip or percussive myotonia.***  Individual muscle group testing (MRC grade out of 5):  Movement     Neck flexion ***    Neck extension ***     Right Left   Shoulder  abduction *** ***   Shoulder adduction *** ***   Shoulder ext rotation *** ***   Shoulder int rotation *** ***   Elbow flexion *** ***   Elbow extension *** ***   Wrist extension *** ***   Wrist flexion *** ***   Finger abduction - FDI *** ***   Finger abduction - ADM *** ***   Finger extension *** ***   Finger distal flexion - 2/3 *** ***   Finger distal flexion - 4/5 *** ***   Thumb flexion - FPL *** ***   Thumb abduction - APB *** ***    Hip flexion *** ***   Hip extension *** ***   Hip adduction *** ***   Hip abduction *** ***   Knee extension *** ***   Knee flexion *** ***   Dorsiflexion *** ***   Plantarflexion *** ***   Inversion *** ***   Eversion *** ***   Great toe extension *** ***   Great toe flexion *** ***     Reflexes:  Right Left   Bicep *** ***   Tricep *** ***   BrRad *** ***   Knee *** ***   Ankle *** ***    Pathological Reflexes: Babinski: *** response bilaterally*** Hoffman: *** Troemner: *** Pectoral: *** Palmomental: *** Facial: *** Midline tap: *** Sensation: Pinprick: *** Vibration: *** Temperature: *** Proprioception: *** Coordination: Intact finger-to- nose-finger bilaterally. Romberg negative.*** Gait: Able to rise from chair with arms crossed unassisted. Normal, narrow-based gait. Able to tandem walk. Able to walk on toes and heels.***  Lab and Test Review: Internal labs: 05/13/23: TSH wnl HbA1c: 4.6 CBC w/ diff significant for Hb 10.8 (chronic), MCV 100.2 CMP unremarkable Lipid panel: Component     Latest Ref Rng 05/13/2023  Cholesterol     0 - 200 mg/dL 161   Triglycerides     0.0 - 149.0 mg/dL 09.6   HDL Cholesterol     >39.00 mg/dL 04.54   VLDL     0.0 - 40.0 mg/dL 09.8   LDL (calc)     0 - 99 mg/dL 69   Total CHOL/HDL Ratio 2   NonHDL 80.16    10/07/22: Vit E wnl Vit A mildly low (30) Vit D wnl Iron studies: ferritin 9 B12: 1217 Folate wnl ANA positive ESR wnl CRP wnl  External  labs: ***  Imaging: ***  ASSESSMENT: NATAHSA MARIAN is a 42 y.o. female who presents for evaluation of ***. *** has a relevant medical history of ***. *** neurological examination is pertinent for ***. Available diagnostic data is significant for ***. This constellation of symptoms and objective data would most likely localize to ***. ***  PLAN: -Blood work: *** *** -EMG: LUE (BP protocol)  -Return to clinic ***  The impression above as well as the plan as outlined below were extensively discussed with  the patient (in the company of ***) who voiced understanding. All questions were answered to their satisfaction.  The patient was counseled on pertinent fall precautions per the printed material provided today, and as noted under the "Patient Instructions" section below.***  When available, results of the above investigations and possible further recommendations will be communicated to the patient via telephone/MyChart. Patient to call office if not contacted after expected testing turnaround time.   Total time spent reviewing records, interview, history/exam, documentation, and coordination of care on day of encounter:  *** min   Thank you for allowing me to participate in patient's care.  If I can answer any additional questions, I would be pleased to do so.  Jacquelyne Balint, MD   CC: Sherlene Shams, MD [address] [place] [address]  CC: Referring provider: Sherlene Shams, MD [address] [place],  [place] [address]

## 2023-05-18 ENCOUNTER — Other Ambulatory Visit: Payer: 59

## 2023-05-19 ENCOUNTER — Ambulatory Visit: Payer: 59 | Admitting: Neurology

## 2023-05-25 ENCOUNTER — Other Ambulatory Visit (INDEPENDENT_AMBULATORY_CARE_PROVIDER_SITE_OTHER): Payer: 59

## 2023-05-25 DIAGNOSIS — D649 Anemia, unspecified: Secondary | ICD-10-CM | POA: Diagnosis not present

## 2023-05-25 LAB — B12 AND FOLATE PANEL
Folate: >24.2 ng/mL (ref >5.9)
Vitamin B-12: >1501 pg/mL — ABNORMAL HIGH (ref 211–911)

## 2023-05-26 LAB — IRON,TIBC AND FERRITIN PANEL
%SAT: 24 % (ref 16–45)
Ferritin: 12 ng/mL — ABNORMAL LOW (ref 16–232)
Iron: 95 ug/dL (ref 40–190)
TIBC: 403 ug/dL (ref 250–450)

## 2023-06-04 ENCOUNTER — Encounter: Payer: Self-pay | Admitting: Emergency Medicine

## 2023-06-10 ENCOUNTER — Ambulatory Visit: Payer: 59

## 2023-06-19 ENCOUNTER — Other Ambulatory Visit: Payer: Self-pay | Admitting: Internal Medicine

## 2023-06-19 ENCOUNTER — Ambulatory Visit: Payer: 59

## 2023-06-22 ENCOUNTER — Encounter: Payer: 59 | Admitting: Neurology

## 2023-07-12 ENCOUNTER — Encounter: Payer: Self-pay | Admitting: Internal Medicine

## 2023-07-12 DIAGNOSIS — Z1231 Encounter for screening mammogram for malignant neoplasm of breast: Secondary | ICD-10-CM

## 2023-07-13 NOTE — Telephone Encounter (Signed)
Pt stated that she has a yeast infection and would like to see if Fluconazole could be prescribed.

## 2023-07-14 MED ORDER — FLUCONAZOLE 150 MG PO TABS: 150.0000 mg | ORAL_TABLET | Freq: Every day | ORAL | 0 refills | Status: AC

## 2023-07-16 ENCOUNTER — Telehealth: Payer: Self-pay | Admitting: Internal Medicine

## 2023-07-16 NOTE — Telephone Encounter (Signed)
Copied from CRM [id number]. Topic: Medicare AWV >> Jul 16, 2023 11:37 AM Payton Doughty wrote: Reason for CRM: Called LVM 07/16/2023 to schedule Annual Wellness Visit  Verlee Rossetti; Care Guide Ambulatory Clinical Support [facility] Direct Dial: [phone]

## 2023-07-21 ENCOUNTER — Other Ambulatory Visit: Payer: Self-pay | Admitting: Internal Medicine

## 2023-08-18 ENCOUNTER — Ambulatory Visit: Payer: 59 | Admitting: Internal Medicine

## 2023-08-18 ENCOUNTER — Encounter: Payer: Self-pay | Admitting: Internal Medicine

## 2023-08-18 VITALS — BP 104/62 | HR 76 | Ht 64.0 in | Wt 151.6 lb

## 2023-08-18 DIAGNOSIS — N912 Amenorrhea, unspecified: Secondary | ICD-10-CM

## 2023-08-18 DIAGNOSIS — K209 Esophagitis, unspecified without bleeding: Secondary | ICD-10-CM | POA: Diagnosis not present

## 2023-08-18 DIAGNOSIS — K3184 Gastroparesis: Secondary | ICD-10-CM

## 2023-08-18 DIAGNOSIS — D649 Anemia, unspecified: Secondary | ICD-10-CM

## 2023-08-18 MED ORDER — METOCLOPRAMIDE HCL 5 MG PO TABS: 5.0000 mg | ORAL_TABLET | Freq: Four times a day (QID) | ORAL | 0 refills | Status: DC

## 2023-08-18 MED ORDER — NYSTATIN 100000 UNIT/ML MT SUSP: 5.0000 mL | Freq: Four times a day (QID) | OROMUCOSAL | 1 refills | Status: AC

## 2023-08-18 MED ORDER — CELECOXIB 400 MG PO CAPS: 400.0000 mg | ORAL_CAPSULE | Freq: Every day | ORAL | 0 refills | Status: DC

## 2023-08-18 NOTE — Progress Notes (Signed)
 Subjective:  Patient ID: [name] [name], female    DOB: 09-Nov-1980  Age: 43 y.o. MRN: [id number]  CC: The primary encounter diagnosis was Nondiabetic gastroparesis. Diagnoses of Amenorrhea, Anemia, unspecified type, and Esophagitis were also pertinent to this visit.   HPI [name] [name] presents for  Chief Complaint  Patient presents with   Medical Management of Chronic Issues    Medication refills    1)  I have Candida esophagitis  that has  not resolved with fluconazole  .  Brushes tongue daily  .  Still Taking dexilant  daily.   Throat feels raw.  New  gi appt is in march with  Dr. West at [facility]   2)  Gastroparesis ;  still a problem , untreated currently,   vomits 1-2 times daily  undigested food or bile   wt loss 1-2 lbs.   Recalls no prior adverse events with reglan .  Advised of the risks of medicaiton ,  wants to try low dose .  Has new GI appt with Shaheen in March at [facility]   3)    Outpatient Medications Prior to Visit  Medication Sig Dispense Refill   amphetamine -dextroamphetamine  (ADDERALL) 30 MG tablet Take 1 tablet by mouth daily. 30 tablet 0   amphetamine -dextroamphetamine  (ADDERALL) 30 MG tablet Take 1 tablet by mouth daily. 30 tablet 0   amphetamine -dextroamphetamine  (ADDERALL) 30 MG tablet Take 1 tablet by mouth daily. 30 tablet 0   b complex vitamins capsule Take 1 capsule by mouth daily.     cholestyramine  (QUESTRAN ) 4 GM/DOSE powder Take 0.5 packets (2 g total) by mouth daily. 378 g 5   DEXILANT  60 MG capsule Take 1 capsule (60 mg total) by mouth daily. 90 capsule 3   fluconazole  (DIFLUCAN ) 150 MG tablet Take 1 tablet (150 mg total) by mouth daily. 2 tablet 0   hydrOXYzine  (ATARAX ) 10 MG tablet Take 10 mg by mouth 3 (three) times daily as needed.     MAGNESIUM PO Take 150 mg by mouth daily.     Omega-3 Fatty Acids (FISH OIL) 300 MG CAPS Take 1 capsule by mouth daily.     RESTASIS 0.05 % ophthalmic emulsion Place 1 drop into both eyes 2 (two) times daily.      valACYclovir  (VALTREX ) 1000 MG tablet Take 1 tablet (1,000 mg total) by mouth daily. 90 tablet 3   Zinc 30 MG TABS Take 1 tablet by mouth daily.     celecoxib  (CELEBREX ) 400 MG capsule TAKE 1 CAPSULE (400 MG TOTAL) BY MOUTH DAILY AFTER BREAKFAST. 30 capsule 0   fluconazole  (DIFLUCAN ) 200 MG tablet Take 200 mg by mouth 2 (two) times a week. (Patient not taking: Reported on 08/18/2023)     valACYclovir  (VALTREX ) 1000 MG tablet Take 1 tablet (1,000 mg total) by mouth daily. 30 tablet 11   valACYclovir  (VALTREX ) 1000 MG tablet Take 1 tablet (1,000 mg total) by mouth daily. (Patient not taking: Reported on 08/18/2023) 30 tablet 11   No facility-administered medications prior to visit.    Review of Systems;  Patient denies headache, fevers, malaise, unintentional weight loss, skin rash, eye pain, sinus congestion and sinus pain, sore throat, dysphagia,  hemoptysis , cough, dyspnea, wheezing, chest pain, palpitations, orthopnea, edema, abdominal pain, nausea, melena, diarrhea, constipation, flank pain, dysuria, hematuria, urinary  Frequency, nocturia, numbness, tingling, seizures,  Focal weakness, Loss of consciousness,  Tremor, insomnia, depression, anxiety, and suicidal ideation.      Objective:  BP 104/62   Pulse 76  Ht 5' 4 (1.626 m)   Wt 151 lb 9.6 oz (68.8 kg)   SpO2 99%   BMI 26.02 kg/m   BP Readings from Last 3 Encounters:  08/18/23 104/62  05/13/23 98/64  10/07/22 108/66    Wt Readings from Last 3 Encounters:  08/18/23 151 lb 9.6 oz (68.8 kg)  05/13/23 152 lb (68.9 kg)  10/07/22 152 lb 9.6 oz (69.2 kg)    Physical Exam Vitals reviewed.  Constitutional:      General: She is not in acute distress.    Appearance: Normal appearance. She is normal weight. She is not ill-appearing, toxic-appearing or diaphoretic.  HENT:     Head: Normocephalic.     Mouth/Throat:     Pharynx: Posterior oropharyngeal erythema present.  Eyes:     General: No scleral icterus.       Right eye: No  discharge.        Left eye: No discharge.     Conjunctiva/sclera: Conjunctivae normal.  Cardiovascular:     Rate and Rhythm: Normal rate and regular rhythm.     Heart sounds: Normal heart sounds.  Pulmonary:     Effort: Pulmonary effort is normal. No respiratory distress.     Breath sounds: Normal breath sounds.  Musculoskeletal:        General: Normal range of motion.  Skin:    General: Skin is warm and dry.  Neurological:     General: No focal deficit present.     Mental Status: She is alert and oriented to person, place, and time. Mental status is at baseline.  Psychiatric:        Mood and Affect: Mood normal.        Behavior: Behavior normal.        Thought Content: Thought content normal.        Judgment: Judgment normal.    Lab Results  Component Value Date   HGBA1C 4.6 05/13/2023    Lab Results  Component Value Date   CREATININE 0.68 05/13/2023   CREATININE 0.74 10/07/2022   CREATININE 0.62 09/25/2016    Lab Results  Component Value Date   WBC 4.2 05/13/2023   HGB 10.8 (L) 05/13/2023   HCT 31.2 (L) 05/13/2023   PLT 192.0 05/13/2023   GLUCOSE 89 05/13/2023   CHOL 142 05/13/2023   TRIG 55.0 05/13/2023   HDL 62.30 05/13/2023   LDLDIRECT 75.0 05/13/2023   LDLCALC 69 05/13/2023   ALT 10 05/13/2023   AST 17 05/13/2023   NA 139 05/13/2023   K 4.0 05/13/2023   CL 105 05/13/2023   CREATININE 0.68 05/13/2023   BUN 16 05/13/2023   CO2 28 05/13/2023   TSH 2.50 05/13/2023   INR 1.11 12/28/2009   HGBA1C 4.6 05/13/2023    MM 3D SCREEN BREAST BILATERAL Result Date: 10/21/2019 CLINICAL DATA:  Screening. EXAM: DIGITAL SCREENING BILATERAL MAMMOGRAM WITH TOMO AND CAD COMPARISON:  None. ACR Breast Density Category b: There are scattered areas of fibroglandular density. FINDINGS: There are no findings suspicious for malignancy. Images were processed with CAD. IMPRESSION: No mammographic evidence of malignancy. A result letter of this screening mammogram will be mailed  directly to the patient. RECOMMENDATION: Screening mammogram in one year. (Code:SM-B-01Y) BI-RADS CATEGORY  1: Negative. Electronically Signed   By: Almarie Daring M.D.   On: 10/21/2019 10:18    Assessment & Plan:  .Nondiabetic gastroparesis Assessment & Plan: Prior GI workup reviewed.  Trial of low dose reglan  to be used twice daily  Amenorrhea -     FSH/LH; Future  Anemia, unspecified type -     CBC with Differential/Platelet; Future -     Iron , TIBC and Ferritin Panel; Future  Esophagitis Assessment & Plan: Tongue and soft palate are beefy red without exudate,  but she has a history of documented recurrent candida esophagitis.  Empiric nystatin  oral solution prescribed for qid use.    Other orders -     Celecoxib ; Take 1 capsule (400 mg total) by mouth daily after breakfast.  Dispense: 30 capsule; Refill: 0 -     Nystatin ; Take 5 mLs (500,000 Units total) by mouth 4 (four) times daily.  Dispense: 60 mL; Refill: 1 -     Metoclopramide  HCl; Take 1 tablet (5 mg total) by mouth 4 (four) times daily.  Dispense: 120 tablet; Refill: 0     I provided 38 minutes of face-to-face time during this encounter reviewing patient's last visit with me, patient's  most recent visit with gastroenterology,  previous  surgical and non surgical procedures, previous  labs and imaging studies, counseling on currently addressed issues,  and post visit ordering to diagnostics and therapeutics .   Follow-up: No follow-ups on file.   Verneita LITTIE Kettering, MD

## 2023-08-18 NOTE — Patient Instructions (Addendum)
 We will try :   Nystatin  swish and swallow for the esophageal irritation from yeast   Reglan 5 mg  before your biggest meals  ,  at the most  2 times daily,  you  may increase dose to 10 mg if needed  but max dose 20 mg daily

## 2023-08-19 DIAGNOSIS — K209 Esophagitis, unspecified without bleeding: Secondary | ICD-10-CM | POA: Insufficient documentation

## 2023-08-19 NOTE — Assessment & Plan Note (Addendum)
 Prior GI workup reviewed.  Trial of low dose reglan to be used twice daily

## 2023-08-19 NOTE — Assessment & Plan Note (Signed)
 Tongue and soft palate are beefy red without exudate,  but she has a history of documented recurrent candida esophagitis.  Empiric nystatin oral solution prescribed for qid use.

## 2023-08-20 ENCOUNTER — Encounter: Payer: Self-pay | Admitting: *Deleted

## 2023-08-21 ENCOUNTER — Other Ambulatory Visit: Payer: 59

## 2023-08-26 ENCOUNTER — Ambulatory Visit: Payer: 59 | Admitting: Internal Medicine

## 2023-08-26 ENCOUNTER — Ambulatory Visit: Payer: 59

## 2023-08-26 DIAGNOSIS — Z1159 Encounter for screening for other viral diseases: Secondary | ICD-10-CM

## 2023-10-14 ENCOUNTER — Other Ambulatory Visit: Payer: Self-pay | Admitting: Internal Medicine

## 2023-11-11 ENCOUNTER — Ambulatory Visit: Payer: 59 | Admitting: Internal Medicine

## 2023-11-11 ENCOUNTER — Telehealth: Payer: Self-pay

## 2023-11-11 NOTE — Telephone Encounter (Signed)
 Copied from CRM [id number]. Topic: Appointments - Appointment Cancel/Reschedule >> Nov 11, 2023  3:32 PM Truddie Crumble wrote: Patient/patient representative is calling to cancel or reschedule an appointment. Refer to attachments for appointment information.   Patient called stating her daughter is sick and had to reschedule her appointment and the doctor ordered labs but she wanted to make sure the insurance was going to pay for it.

## 2023-11-13 ENCOUNTER — Other Ambulatory Visit

## 2023-11-14 ENCOUNTER — Other Ambulatory Visit: Payer: Self-pay | Admitting: Internal Medicine

## 2023-11-16 ENCOUNTER — Other Ambulatory Visit

## 2023-12-15 ENCOUNTER — Ambulatory Visit: Admitting: Internal Medicine

## 2023-12-15 ENCOUNTER — Other Ambulatory Visit (INDEPENDENT_AMBULATORY_CARE_PROVIDER_SITE_OTHER)

## 2023-12-15 DIAGNOSIS — D649 Anemia, unspecified: Secondary | ICD-10-CM

## 2023-12-15 DIAGNOSIS — N912 Amenorrhea, unspecified: Secondary | ICD-10-CM

## 2023-12-15 NOTE — Addendum Note (Signed)
 Addended by: Thressa Flora D on: 12/15/2023 04:01 PM   Modules accepted: Orders

## 2023-12-16 ENCOUNTER — Encounter: Payer: Self-pay | Admitting: Internal Medicine

## 2023-12-16 LAB — FSH/LH
FSH: 57.6 m[IU]/mL
LH: 43.3 m[IU]/mL

## 2023-12-16 LAB — IRON,TIBC AND FERRITIN PANEL
%SAT: 20 % (ref 16–45)
Ferritin: 15 ng/mL — ABNORMAL LOW (ref 16–232)
Iron: 68 ng/mL — ABNORMAL LOW (ref 40–232)
TIBC: 342 ug/dL (ref 250–450)

## 2023-12-16 LAB — CBC WITH DIFFERENTIAL/PLATELET
Absolute Lymphocytes: 1571 {cells}/uL (ref 850–3900)
Absolute Monocytes: 256 {cells}/uL (ref 200–950)
Basophils Absolute: 38 {cells}/uL (ref 0–200)
Basophils Relative: 0.9 %
Eosinophils Absolute: 38 {cells}/uL (ref 15–500)
Eosinophils Relative: 0.9 %
HCT: 32 % — ABNORMAL LOW (ref 35.0–45.0)
Hemoglobin: 11 g/dL — ABNORMAL LOW (ref 11.7–15.5)
MCH: 34 pg — ABNORMAL HIGH (ref 27.0–33.0)
MCHC: 34.4 g/dL (ref 32.0–36.0)
MCV: 98.8 fL (ref 80.0–100.0)
MPV: 11.7 fL (ref 7.5–12.5)
Monocytes Relative: 6.1 %
Neutro Abs: 2297 {cells}/uL (ref 1500–7800)
Neutrophils Relative %: 54.7 %
Platelets: 198 10*3/uL (ref 140–400)
RBC: 3.24 10*6/uL — ABNORMAL LOW (ref 3.80–5.10)
RDW: 12.5 % (ref 11.0–15.0)
Total Lymphocyte: 37.4 %
WBC: 4.2 10*3/uL (ref 3.8–10.8)

## 2023-12-17 ENCOUNTER — Other Ambulatory Visit: Payer: Self-pay | Admitting: Internal Medicine

## 2024-01-21 ENCOUNTER — Other Ambulatory Visit: Payer: Self-pay | Admitting: Internal Medicine

## 2024-01-26 ENCOUNTER — Ambulatory Visit (INDEPENDENT_AMBULATORY_CARE_PROVIDER_SITE_OTHER): Admitting: Internal Medicine

## 2024-01-26 ENCOUNTER — Encounter: Payer: Self-pay | Admitting: Internal Medicine

## 2024-01-26 VITALS — BP 110/62 | HR 88 | Ht 64.0 in | Wt 149.8 lb

## 2024-01-26 DIAGNOSIS — D508 Other iron deficiency anemias: Secondary | ICD-10-CM | POA: Diagnosis not present

## 2024-01-26 DIAGNOSIS — N951 Menopausal and female climacteric states: Secondary | ICD-10-CM

## 2024-01-26 MED ORDER — CELECOXIB 200 MG PO CAPS: 200.0000 mg | ORAL_CAPSULE | Freq: Every day | ORAL | 1 refills | Status: DC

## 2024-01-26 MED ORDER — CHOLESTYRAMINE 4 GM/DOSE PO POWD: 2.0000 g | Freq: Every day | ORAL | 5 refills | Status: AC

## 2024-01-26 MED ORDER — ESTRADIOL 0.025 MG/24HR TD PTTW: 1.0000 | MEDICATED_PATCH | TRANSDERMAL | 12 refills | Status: DC

## 2024-01-26 MED ORDER — DEXILANT 60 MG PO CPDR: 1.0000 | DELAYED_RELEASE_CAPSULE | Freq: Every day | ORAL | 1 refills | Status: DC

## 2024-01-26 MED ORDER — PROGESTERONE MICRONIZED 100 MG PO CAPS
100.0000 mg | ORAL_CAPSULE | Freq: Every day | ORAL | 1 refills | Status: AC
Start: 2024-01-26 — End: ?
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
## 2024-01-26 NOTE — Patient Instructions (Addendum)
 For menopause symptoms.  I am starting you on hormone replacement   Estrogen : Start with one patch daily , if no  relief  after 7-10 days,  you can use 2 patches and request the higher dose  patch for refill  The progesterone must be taken to prevent endometrial hyperplasia . If you don't tolerate daily dosing,  you will need to take 2 pills for 12 days out of each month (100 mg daily x 30 days,  or 200 mg daily x 12 days per month)   I am making a Referral for IV iron  infusions    Try ice cream made by [employer]   for lactose intolerance

## 2024-01-27 DIAGNOSIS — N951 Menopausal and female climacteric states: Secondary | ICD-10-CM | POA: Insufficient documentation

## 2024-01-27 NOTE — Assessment & Plan Note (Addendum)
 Chronic/recurrent.  Etiology unclear.  She is perimenopausal.    Referring to hematology for iron  infusions as she is iron  intolerant  due to chronic GI issues   she  was scheduled to see GI in march but postponed due to Flu. Colonoscopy /EGD needed.  FOBT ordered.   Lab Results  Component Value Date   IRON  68 12/15/2023   TIBC 342 12/15/2023   FERRITIN 15 (L) 12/15/2023   Lab Results  Component Value Date   VITAMINB12 >1501 (H) 05/25/2023   Lab Results  Component Value Date   FOLATE >24.2 05/25/2023

## 2024-01-27 NOTE — Assessment & Plan Note (Signed)
 FSH and LH are diagnostic but has had infrequent menses over the last several months.  Trial of vivelle dot and progesterone

## 2024-02-01 ENCOUNTER — Other Ambulatory Visit: Payer: Self-pay | Admitting: Internal Medicine

## 2024-02-05 ENCOUNTER — Other Ambulatory Visit: Payer: Self-pay | Admitting: Internal Medicine

## 2024-02-15 ENCOUNTER — Other Ambulatory Visit: Payer: Self-pay | Admitting: Internal Medicine

## 2024-02-16 ENCOUNTER — Telehealth: Payer: Self-pay | Admitting: Pharmacy Technician

## 2024-02-16 ENCOUNTER — Other Ambulatory Visit (HOSPITAL_COMMUNITY): Payer: Self-pay

## 2024-02-16 NOTE — Telephone Encounter (Signed)
 PA request has been Received. New Encounter has been or will be created for follow up. For additional info see Pharmacy Prior Auth telephone encounter from 02/16/2024.

## 2024-02-16 NOTE — Telephone Encounter (Signed)
 Pharmacy Patient Advocate Encounter   Received notification from RX Request Messages that prior authorization for Dexlansoprazole  60mg  capsules is required/requested.   Insurance verification completed.   The patient is insured through [employer] .   Per test claim: Refill too soon. PA is not needed at this time. Medication was filled 02/08/2024. Next eligible fill date is 03/02/2024.  Plan covers generic.

## 2024-02-17 NOTE — Telephone Encounter (Signed)
 Per test claim: Refill too soon. PA is not needed at this time. Medication was filled 02/08/2024. Next eligible fill date is 03/02/2024.

## 2024-02-18 ENCOUNTER — Inpatient Hospital Stay

## 2024-02-18 ENCOUNTER — Inpatient Hospital Stay: Admitting: Oncology

## 2024-02-22 ENCOUNTER — Other Ambulatory Visit: Payer: Self-pay | Admitting: Internal Medicine

## 2024-04-06 ENCOUNTER — Other Ambulatory Visit: Payer: Self-pay | Admitting: Internal Medicine

## 2024-05-01 ENCOUNTER — Other Ambulatory Visit: Payer: Self-pay | Admitting: Internal Medicine

## 2024-05-14 ENCOUNTER — Other Ambulatory Visit: Payer: Self-pay | Admitting: Internal Medicine

## 2024-05-24 ENCOUNTER — Other Ambulatory Visit (HOSPITAL_COMMUNITY): Payer: Self-pay

## 2024-05-24 ENCOUNTER — Telehealth: Payer: Self-pay

## 2024-05-24 NOTE — Telephone Encounter (Signed)
 Pharmacy Patient Advocate Encounter   Received notification from CoverMyMeds that prior authorization for Estradiol  0.025MG /24HR biweekly patches  is required/requested.   Insurance verification completed.   The patient is insured through [facility].   Per test claim: OPTUMRX ESTRADIOL  PT WK; WILL COVER ONCE WEEKLY PATCHES BUT NOT BIWEEKLY AT ESTIMATED COST OF 0.00 SEE TEST CLAIM BELOW.   PLEASE BE ADVISED THESE ARE SOME OTHER PREF'D ONES.ELESTRIN ; FEMRING; PREMARIN; PREMARIN VAG CREAM ARE preferred by the insurance.     PLEASE LET ME KNOW IF YOU WOULD WANT TO PROCEEDED WITH PA    If suggested medication is appropriate, Please send in a new RX and discontinue this one. If not, please advise as to why it's not appropriate so that we may request a Prior Authorization. Please note, some preferred medications may still require a PA.  If the suggested medications have not been trialed and there are no contraindications to their use, the PA will not be submitted, as it will not be approved.Pharmacy Patient Advocate Encounter

## 2024-05-25 ENCOUNTER — Other Ambulatory Visit (HOSPITAL_COMMUNITY): Payer: Self-pay

## 2024-05-26 NOTE — Telephone Encounter (Signed)
 noted

## 2024-05-27 MED ORDER — ESTRADIOL 0.025 MG/24HR TD PTWK: 0.0250 mg | MEDICATED_PATCH | TRANSDERMAL | 12 refills | Status: DC

## 2024-05-27 NOTE — Telephone Encounter (Signed)
 Pharmacy Patient Advocate Encounter  Received notification from [facility] that Prior Authorization for Estradiol  0.025MG /24HR biweekly patches  has been DENIED.  See denial reason below. No denial letter attached in CMM. Will attach denial letter to Media tab once received.   PA #/Case ID/Reference #: [id number]  PLEASE BE ADVISE OUTCOME

## 2024-05-27 NOTE — Addendum Note (Signed)
 Addended by: MARYLYNN VERNEITA CROME on: 05/27/2024 05:50 PM   Modules accepted: Orders

## 2024-05-30 NOTE — Telephone Encounter (Signed)
 LVM stating for pt to call office back to receive results. When pt calls back ok to give results and document

## 2024-06-18 ENCOUNTER — Other Ambulatory Visit: Payer: Self-pay | Admitting: Internal Medicine

## 2024-06-21 ENCOUNTER — Encounter: Payer: Self-pay | Admitting: Internal Medicine

## 2024-06-21 ENCOUNTER — Telehealth: Payer: Self-pay

## 2024-06-21 NOTE — Telephone Encounter (Signed)
 Noted

## 2024-06-21 NOTE — Telephone Encounter (Signed)
 Patient scheduled an appointment via MyChart to see Dr. Verneita Kettering on 08/23/2024 with the following comment:  Continue to lose weight  I called patient to let her know that we see where she just scheduled an appointment to see Dr. Verneita Kettering on 08/23/2024, but she already has an appointment scheduled to see Dr. Kettering on 07/27/2024.  I asked patient to please let us  know if she would like to keep her appointment on 08/23/2024.

## 2024-06-21 NOTE — Telephone Encounter (Signed)
 Copied from CRM #[id number]. Topic: General - Other >> Jun 21, 2024 12:09 PM China J wrote: Reason for CRM: The patient would like to know why Dr. Marylynn wanted her to do  FOB testing and if she would be able to send in another oncology referral so she can start iron  infusions as previously discussed.

## 2024-07-12 ENCOUNTER — Other Ambulatory Visit: Payer: Self-pay | Admitting: Internal Medicine

## 2024-07-19 ENCOUNTER — Other Ambulatory Visit: Payer: Self-pay | Admitting: Internal Medicine

## 2024-07-27 ENCOUNTER — Encounter: Payer: Self-pay | Admitting: Internal Medicine

## 2024-07-27 ENCOUNTER — Ambulatory Visit: Admitting: Internal Medicine

## 2024-07-27 VITALS — BP 94/66 | HR 54 | Ht 64.0 in | Wt 141.2 lb

## 2024-07-27 DIAGNOSIS — D509 Iron deficiency anemia, unspecified: Secondary | ICD-10-CM

## 2024-07-27 DIAGNOSIS — N951 Menopausal and female climacteric states: Secondary | ICD-10-CM

## 2024-07-27 DIAGNOSIS — F900 Attention-deficit hyperactivity disorder, predominantly inattentive type: Secondary | ICD-10-CM | POA: Diagnosis not present

## 2024-07-27 DIAGNOSIS — M6281 Muscle weakness (generalized): Secondary | ICD-10-CM | POA: Diagnosis not present

## 2024-07-27 DIAGNOSIS — M1990 Unspecified osteoarthritis, unspecified site: Secondary | ICD-10-CM | POA: Diagnosis not present

## 2024-07-27 DIAGNOSIS — E611 Iron deficiency: Secondary | ICD-10-CM

## 2024-07-27 DIAGNOSIS — Z9149 Other personal history of psychological trauma, not elsewhere classified: Secondary | ICD-10-CM

## 2024-07-27 DIAGNOSIS — R202 Paresthesia of skin: Secondary | ICD-10-CM

## 2024-07-27 MED ORDER — ESTRADIOL 0.05 MG/24HR TD PTWK: 0.0500 mg | MEDICATED_PATCH | TRANSDERMAL | 11 refills | Status: DC

## 2024-07-27 NOTE — Progress Notes (Unsigned)
 Subjective:  Patient ID: [name] [name], female    DOB: March 04, 1981  Age: 43 y.o. MRN: [id number]  CC: There were no encounter diagnoses.   HPI [name] [name] presents for  Chief Complaint  Patient presents with   Medical Management of Chronic Issues    Last seen in May 2025  CBC and iron  stores mildly low.  Asked to return for a FOBT to determine source of bleeding and referred to hematology for iron  infusions  in June . Has not had any iron  infusion and has not returned for the FOBT .  Has not been reading mychart messages,  didn't know how to find them   Positive depression screen:  tearful ,  seeing a therapist. .  Thinks the symptoms may be due to menopause :  flushing,  forgetting things.  Reviewed prior treatments: Wellbutrin cymbalta both tried and eithe r ineffeteive or not tolerated .  She has  tolerated lexapro in the past    Outpatient Medications Prior to Visit  Medication Sig Dispense Refill   amphetamine -dextroamphetamine  (ADDERALL) 30 MG tablet Take 1 tablet by mouth daily. 30 tablet 0   amphetamine -dextroamphetamine  (ADDERALL) 30 MG tablet Take 1 tablet by mouth daily. 30 tablet 0   amphetamine -dextroamphetamine  (ADDERALL) 30 MG tablet Take 1 tablet by mouth daily. 30 tablet 0   b complex vitamins capsule Take 1 capsule by mouth daily.     celecoxib  (CELEBREX ) 200 MG capsule TAKE 1 CAPSULE (200 MG TOTAL) BY MOUTH DAILY AFTER BREAKFAST. 90 capsule 1   cholestyramine  (QUESTRAN ) 4 GM/DOSE powder Take 0.5 packets (2 g total) by mouth daily. 378 g 5   dexlansoprazole  (DEXILANT ) 60 MG capsule TAKE 1 CAPSULE BY MOUTH EVERY DAY 90 capsule 3   estradiol  (CLIMARA  - DOSED IN MG/24 HR) 0.025 mg/24hr patch PLACE 1 PATCH (0.025 MG TOTAL) ONTO THE SKIN ONCE A WEEK. 12 patch 0   fluconazole  (DIFLUCAN ) 150 MG tablet Take 1 tablet (150 mg total) by mouth daily. 2 tablet 0   hydrOXYzine  (ATARAX ) 10 MG tablet Take 10 mg by mouth 3 (three) times daily as needed.     MAGNESIUM PO Take 150  mg by mouth daily.     nystatin  (MYCOSTATIN ) 100000 UNIT/ML suspension Take 5 mLs (500,000 Units total) by mouth 4 (four) times daily. 60 mL 1   Omega-3 Fatty Acids (FISH OIL) 300 MG CAPS Take 1 capsule by mouth daily.     progesterone  (PROMETRIUM ) 100 MG capsule TAKE 1 CAPSULE BY MOUTH EVERY DAY 90 capsule 1   RESTASIS 0.05 % ophthalmic emulsion Place 1 drop into both eyes 2 (two) times daily.     valACYclovir  (VALTREX ) 1000 MG tablet TAKE 1 TABLET BY MOUTH EVERY DAY 90 tablet 3   Zinc 30 MG TABS Take 1 tablet by mouth daily.     No facility-administered medications prior to visit.    Review of Systems;  Patient denies headache, fevers, malaise, unintentional weight loss, skin rash, eye pain, sinus congestion and sinus pain, sore throat, dysphagia,  hemoptysis , cough, dyspnea, wheezing, chest pain, palpitations, orthopnea, edema, abdominal pain, nausea, melena, diarrhea, constipation, flank pain, dysuria, hematuria, urinary  Frequency, nocturia, numbness, tingling, seizures,  Focal weakness, Loss of consciousness,  Tremor, insomnia, depression, anxiety, and suicidal ideation.      Objective:  BP 94/66   Pulse (!) 54   Ht 5' 4 (1.626 m)   Wt 141 lb 3.2 oz (64 kg)   SpO2 99%   BMI  24.24 kg/m   BP Readings from Last 3 Encounters:  07/27/24 94/66  01/26/24 110/62  08/18/23 104/62    Wt Readings from Last 3 Encounters:  07/27/24 141 lb 3.2 oz (64 kg)  01/26/24 149 lb 12.8 oz (67.9 kg)  08/18/23 151 lb 9.6 oz (68.8 kg)    Physical Exam  Lab Results  Component Value Date   HGBA1C 4.6 05/13/2023    Lab Results  Component Value Date   CREATININE 0.68 05/13/2023   CREATININE 0.74 10/07/2022   CREATININE 0.62 09/25/2016    Lab Results  Component Value Date   WBC 4.2 12/15/2023   HGB 11.0 (L) 12/15/2023   HCT 32.0 (L) 12/15/2023   PLT 198 12/15/2023   GLUCOSE 89 05/13/2023   CHOL 142 05/13/2023   TRIG 55.0 05/13/2023   HDL 62.30 05/13/2023   LDLDIRECT 75.0  05/13/2023   LDLCALC 69 05/13/2023   ALT 10 05/13/2023   AST 17 05/13/2023   NA 139 05/13/2023   K 4.0 05/13/2023   CL 105 05/13/2023   CREATININE 0.68 05/13/2023   BUN 16 05/13/2023   CO2 28 05/13/2023   TSH 2.50 05/13/2023   INR 1.11 12/28/2009   HGBA1C 4.6 05/13/2023    MM 3D SCREEN BREAST BILATERAL Result Date: 10/21/2019 CLINICAL DATA:  Screening. EXAM: DIGITAL SCREENING BILATERAL MAMMOGRAM WITH TOMO AND CAD COMPARISON:  None. ACR Breast Density Category b: There are scattered areas of fibroglandular density. FINDINGS: There are no findings suspicious for malignancy. Images were processed with CAD. IMPRESSION: No mammographic evidence of malignancy. A result letter of this screening mammogram will be mailed directly to the patient. RECOMMENDATION: Screening mammogram in one year. (Code:SM-B-01Y) BI-RADS CATEGORY  1: Negative. Electronically Signed   By: Almarie Daring M.D.   On: 10/21/2019 10:18    Assessment & Plan:  .There are no diagnoses linked to this encounter.   I spent 34 minutes on the day of this face to face encounter reviewing patient's  most recent visit with cardiology,  nephrology,  and neurology,  prior relevant surgical and non surgical procedures, recent  labs and imaging studies, counseling on weight management,  reviewing the assessment and plan with patient, and post visit ordering and reviewing of  diagnostics and therapeutics with patient  .   Follow-up: No follow-ups on file.   [name] LITTIE Kettering, MD

## 2024-07-27 NOTE — Patient Instructions (Signed)
 I HAVE INCREASED YOUR CLIMARA  DOSE TO 0.05 MG . YOU CAN WEAR 2 PATCHES  OF YOUR PREVIOUS DOSE UNTIL YOU RUN OUT  IF YOUR SYMPTOMS DON'T IMPROVE IN A FEW WEEKS  LET ME KNOW  I WILL REPEAT THE IRON  INFUSION REFERRAL , BUT CALL THE NUMBER IN THE MESSAGE

## 2024-07-28 LAB — CBC WITH DIFFERENTIAL/PLATELET
Basophils Absolute: 0 K/uL (ref 0.0–0.1)
Basophils Relative: 0.8 % (ref 0.0–3.0)
Eosinophils Absolute: 0 K/uL (ref 0.0–0.7)
Eosinophils Relative: 0.4 % (ref 0.0–5.0)
HCT: 36.2 % (ref 36.0–46.0)
Hemoglobin: 12.7 g/dL (ref 12.0–15.0)
Lymphocytes Relative: 31 % (ref 12.0–46.0)
Lymphs Abs: 1.4 K/uL (ref 0.7–4.0)
MCHC: 35.1 g/dL (ref 30.0–36.0)
MCV: 99.9 fl (ref 78.0–100.0)
Monocytes Absolute: 0.2 K/uL (ref 0.1–1.0)
Monocytes Relative: 5 % (ref 3.0–12.0)
Neutro Abs: 2.8 K/uL (ref 1.4–7.7)
Neutrophils Relative %: 62.8 % (ref 43.0–77.0)
Platelets: 252 K/uL (ref 150.0–400.0)
RBC: 3.62 Mil/uL — ABNORMAL LOW (ref 3.87–5.11)
RDW: 13.4 % (ref 11.5–15.5)
WBC: 4.5 K/uL (ref 4.0–10.5)

## 2024-07-28 LAB — COMPREHENSIVE METABOLIC PANEL WITH GFR
ALT: 13 U/L (ref 3–35)
AST: 20 U/L (ref 5–37)
Albumin: 4.7 g/dL (ref 3.5–5.2)
Alkaline Phosphatase: 64 U/L (ref 39–117)
BUN: 16 mg/dL (ref 6–23)
CO2: 28 meq/L (ref 19–32)
Calcium: 9.5 mg/dL (ref 8.4–10.5)
Chloride: 104 meq/L (ref 96–112)
Creatinine, Ser: 0.59 mg/dL (ref 0.40–1.20)
GFR: 108.44 mL/min (ref >60.00)
Glucose, Bld: 90 mg/dL (ref 70–99)
Potassium: 3.8 meq/L (ref 3.5–5.1)
Sodium: 140 meq/L (ref 135–145)
Total Bilirubin: 0.6 mg/dL (ref 0.2–1.2)
Total Protein: 7.5 g/dL (ref 6.0–8.3)

## 2024-07-28 LAB — IRON,TIBC AND FERRITIN PANEL
%SAT: 25 % (ref 16–45)
Ferritin: 15 ng/mL — ABNORMAL LOW (ref 16–232)
Iron: 91 ug/dL (ref 40–190)
TIBC: 370 ug/dL (ref 250–450)

## 2024-07-30 ENCOUNTER — Ambulatory Visit: Payer: Self-pay | Admitting: Internal Medicine

## 2024-07-30 NOTE — Assessment & Plan Note (Signed)
 She was referred for EMG /[place] studies in October 2024 whiehc were never done.  Symptoms have resolved.  No further workup

## 2024-07-30 NOTE — Assessment & Plan Note (Signed)
 FSH and LH are diagnostic  and transdermal estrogen with daily  oral progesterone  were prescribed.  She states that the current treatment is not  effective. Twice weekly Vivelle  had been changed to one weekly Climara  due to insurance coverage Dose of estrogen patch increased today to 0.5 mg

## 2024-07-30 NOTE — Assessment & Plan Note (Signed)
 Continue follow up with psychiatry /psychology

## 2024-07-30 NOTE — Assessment & Plan Note (Signed)
 She has been attributing her joint pain due various rheumatologic causes including  Lymes disease but has no positive serologies  except a positive ANA.  She has been evaluated by Dr Tobie, [facility] and review of Dec 2025 note  did not confirm any diagnosis except fibromyalgia.

## 2024-07-30 NOTE — Assessment & Plan Note (Addendum)
 She was referred to Neurology and EMG /[place] studies and was scheduled in October 2024 but did not keep or reschedule  her appointment. She was evalluated by [facility] with MRI which noted the following issues: . AC arthropathy. Low lying acromion. Cuff tendinosis and hypertrophy. Frayed cuff insertions. Mild bursitis. Biceps long head is intact. Intact labrum. No acute capsular injury. No macroinstability. No muscle or myofascia injury. No osseous injury. No soft tissue mass. CONCLUSION: 1. Cuff tendinosis. Frayed cuff insertions. Mild bursitis. 2. Mild AC arthropathy. No active stress injury. Low lying acromion.

## 2024-07-30 NOTE — Assessment & Plan Note (Signed)
 Managed by  psychiatry with Adderall montly prescribed. Given her complicated psychiatric history, she is strongly encouraged to maintain a therapeutic relationship with a psychiatrist for managment

## 2024-07-30 NOTE — Assessment & Plan Note (Addendum)
 She is not anemic and iron  stores qppear adequate on repeat asessment.  She has chronic GI issues but workup has no been completed.  She is perimenopausal.  she was referred to hematology for iron  infusions  which are no longer needed.    she  was also  scheduled to see GI in March 2025 but postponed due to Flu and has not rescheduled.  Colonoscopy /EGD needed.  FOBT re ordered.   Lab Results  Component Value Date   WBC 4.5 07/27/2024   HGB 12.7 07/27/2024   HCT 36.2 07/27/2024   MCV 99.9 07/27/2024   PLT 252.0 07/27/2024    Lab Results  Component Value Date   IRON  91 07/27/2024   TIBC 370 07/27/2024   FERRITIN 15 (L) 07/27/2024   Lab Results  Component Value Date   VITAMINB12 >1501 (H) 05/25/2023   Lab Results  Component Value Date   FOLATE >24.2 05/25/2023

## 2024-07-30 NOTE — Assessment & Plan Note (Signed)
 Managed with supplementation  Lab Results  Component Value Date   VITAMINB12 >1501 (H) 05/25/2023

## 2024-07-30 NOTE — Addendum Note (Signed)
 Addended by: MARYLYNN VERNEITA CROME on: 07/30/2024 01:25 PM   Modules accepted: Orders

## 2024-08-16 ENCOUNTER — Other Ambulatory Visit: Payer: Self-pay | Admitting: Internal Medicine

## 2024-08-23 ENCOUNTER — Ambulatory Visit: Admitting: Internal Medicine

## 2024-09-12 ENCOUNTER — Other Ambulatory Visit: Payer: Self-pay | Admitting: Internal Medicine
